# Patient Record
Sex: Female | Born: 1970 | Race: White | Hispanic: No | Marital: Married | Smoking: Never smoker
Health system: Southern US, Community
[De-identification: ages and names within clinical notes are randomized; demographics above are authoritative.]

## PROBLEM LIST (undated history)

## (undated) DIAGNOSIS — M1990 Unspecified osteoarthritis, unspecified site: Secondary | ICD-10-CM

## (undated) DIAGNOSIS — F32A Depression, unspecified: Secondary | ICD-10-CM

## (undated) DIAGNOSIS — F329 Major depressive disorder, single episode, unspecified: Secondary | ICD-10-CM

## (undated) DIAGNOSIS — N809 Endometriosis, unspecified: Secondary | ICD-10-CM

## (undated) DIAGNOSIS — K219 Gastro-esophageal reflux disease without esophagitis: Secondary | ICD-10-CM

## (undated) DIAGNOSIS — F419 Anxiety disorder, unspecified: Secondary | ICD-10-CM

## (undated) HISTORY — DX: Major depressive disorder, single episode, unspecified: F32.9

## (undated) HISTORY — DX: Depression, unspecified: F32.A

## (undated) HISTORY — PX: ORTHOPEDIC SURGERY: SHX850

## (undated) HISTORY — DX: Unspecified osteoarthritis, unspecified site: M19.90

## (undated) HISTORY — DX: Anxiety disorder, unspecified: F41.9

## (undated) HISTORY — DX: Gastro-esophageal reflux disease without esophagitis: K21.9

## (undated) HISTORY — DX: Endometriosis, unspecified: N80.9

## (undated) HISTORY — PX: PARTIAL HYSTERECTOMY: SHX80

---

## 2006-07-20 LAB — CONVERTED CEMR LAB: Pap Smear: NORMAL

## 2007-06-03 ENCOUNTER — Ambulatory Visit: Payer: Self-pay | Admitting: Internal Medicine

## 2007-06-03 DIAGNOSIS — J45909 Unspecified asthma, uncomplicated: Secondary | ICD-10-CM | POA: Insufficient documentation

## 2007-06-03 DIAGNOSIS — A6000 Herpesviral infection of urogenital system, unspecified: Secondary | ICD-10-CM | POA: Insufficient documentation

## 2007-06-03 DIAGNOSIS — F411 Generalized anxiety disorder: Secondary | ICD-10-CM | POA: Insufficient documentation

## 2007-07-29 ENCOUNTER — Ambulatory Visit: Payer: Self-pay | Admitting: Internal Medicine

## 2007-08-12 ENCOUNTER — Telehealth: Payer: Self-pay | Admitting: Internal Medicine

## 2007-09-01 ENCOUNTER — Telehealth (INDEPENDENT_AMBULATORY_CARE_PROVIDER_SITE_OTHER): Payer: Self-pay | Admitting: *Deleted

## 2007-09-01 ENCOUNTER — Telehealth: Payer: Self-pay | Admitting: Internal Medicine

## 2007-10-23 ENCOUNTER — Telehealth (INDEPENDENT_AMBULATORY_CARE_PROVIDER_SITE_OTHER): Payer: Self-pay | Admitting: *Deleted

## 2007-10-24 ENCOUNTER — Ambulatory Visit: Payer: Self-pay | Admitting: Internal Medicine

## 2007-10-24 DIAGNOSIS — F411 Generalized anxiety disorder: Secondary | ICD-10-CM | POA: Insufficient documentation

## 2007-11-20 HISTORY — PX: OOPHORECTOMY: SHX86

## 2008-01-21 ENCOUNTER — Telehealth: Payer: Self-pay | Admitting: Internal Medicine

## 2008-04-19 ENCOUNTER — Telehealth (INDEPENDENT_AMBULATORY_CARE_PROVIDER_SITE_OTHER): Payer: Self-pay | Admitting: *Deleted

## 2008-04-23 ENCOUNTER — Telehealth (INDEPENDENT_AMBULATORY_CARE_PROVIDER_SITE_OTHER): Payer: Self-pay | Admitting: *Deleted

## 2008-05-26 ENCOUNTER — Telehealth (INDEPENDENT_AMBULATORY_CARE_PROVIDER_SITE_OTHER): Payer: Self-pay | Admitting: *Deleted

## 2008-07-09 ENCOUNTER — Ambulatory Visit: Payer: Self-pay | Admitting: Internal Medicine

## 2008-07-27 ENCOUNTER — Telehealth (INDEPENDENT_AMBULATORY_CARE_PROVIDER_SITE_OTHER): Payer: Self-pay | Admitting: *Deleted

## 2008-09-07 ENCOUNTER — Telehealth (INDEPENDENT_AMBULATORY_CARE_PROVIDER_SITE_OTHER): Payer: Self-pay | Admitting: *Deleted

## 2008-10-12 ENCOUNTER — Telehealth (INDEPENDENT_AMBULATORY_CARE_PROVIDER_SITE_OTHER): Payer: Self-pay | Admitting: *Deleted

## 2008-10-12 ENCOUNTER — Ambulatory Visit: Payer: Self-pay | Admitting: Family Medicine

## 2008-10-12 ENCOUNTER — Telehealth: Payer: Self-pay | Admitting: Family Medicine

## 2008-10-25 ENCOUNTER — Telehealth (INDEPENDENT_AMBULATORY_CARE_PROVIDER_SITE_OTHER): Payer: Self-pay | Admitting: *Deleted

## 2009-01-17 ENCOUNTER — Telehealth (INDEPENDENT_AMBULATORY_CARE_PROVIDER_SITE_OTHER): Payer: Self-pay | Admitting: *Deleted

## 2009-03-07 ENCOUNTER — Telehealth (INDEPENDENT_AMBULATORY_CARE_PROVIDER_SITE_OTHER): Payer: Self-pay | Admitting: *Deleted

## 2009-04-30 ENCOUNTER — Emergency Department (HOSPITAL_BASED_OUTPATIENT_CLINIC_OR_DEPARTMENT_OTHER): Admission: EM | Admit: 2009-04-30 | Discharge: 2009-04-30 | Payer: Self-pay | Admitting: Emergency Medicine

## 2009-05-19 HISTORY — PX: PELVIC FRACTURE SURGERY: SHX119

## 2009-06-03 ENCOUNTER — Telehealth (INDEPENDENT_AMBULATORY_CARE_PROVIDER_SITE_OTHER): Payer: Self-pay | Admitting: *Deleted

## 2009-06-10 ENCOUNTER — Ambulatory Visit (HOSPITAL_COMMUNITY): Admission: RE | Admit: 2009-06-10 | Discharge: 2009-06-10 | Payer: Self-pay | Admitting: Obstetrics & Gynecology

## 2009-06-10 ENCOUNTER — Encounter (INDEPENDENT_AMBULATORY_CARE_PROVIDER_SITE_OTHER): Payer: Self-pay | Admitting: Obstetrics & Gynecology

## 2009-06-17 ENCOUNTER — Ambulatory Visit: Payer: Self-pay | Admitting: Internal Medicine

## 2009-09-14 ENCOUNTER — Ambulatory Visit: Payer: Self-pay | Admitting: Internal Medicine

## 2009-09-14 DIAGNOSIS — N946 Dysmenorrhea, unspecified: Secondary | ICD-10-CM | POA: Insufficient documentation

## 2009-09-14 DIAGNOSIS — G4700 Insomnia, unspecified: Secondary | ICD-10-CM | POA: Insufficient documentation

## 2009-10-04 ENCOUNTER — Telehealth (INDEPENDENT_AMBULATORY_CARE_PROVIDER_SITE_OTHER): Payer: Self-pay | Admitting: *Deleted

## 2009-10-25 ENCOUNTER — Telehealth (INDEPENDENT_AMBULATORY_CARE_PROVIDER_SITE_OTHER): Payer: Self-pay | Admitting: *Deleted

## 2009-10-31 ENCOUNTER — Telehealth: Payer: Self-pay | Admitting: Internal Medicine

## 2009-11-14 ENCOUNTER — Emergency Department (HOSPITAL_BASED_OUTPATIENT_CLINIC_OR_DEPARTMENT_OTHER): Admission: EM | Admit: 2009-11-14 | Discharge: 2009-11-14 | Payer: Self-pay | Admitting: Emergency Medicine

## 2009-11-15 ENCOUNTER — Telehealth (INDEPENDENT_AMBULATORY_CARE_PROVIDER_SITE_OTHER): Payer: Self-pay | Admitting: *Deleted

## 2009-12-01 ENCOUNTER — Ambulatory Visit: Payer: Self-pay | Admitting: Internal Medicine

## 2009-12-13 ENCOUNTER — Encounter: Payer: Self-pay | Admitting: Internal Medicine

## 2009-12-15 ENCOUNTER — Telehealth: Payer: Self-pay | Admitting: Internal Medicine

## 2009-12-27 ENCOUNTER — Telehealth: Payer: Self-pay | Admitting: Internal Medicine

## 2010-02-24 ENCOUNTER — Telehealth: Payer: Self-pay | Admitting: Internal Medicine

## 2010-03-22 ENCOUNTER — Telehealth: Payer: Self-pay | Admitting: Internal Medicine

## 2010-04-19 ENCOUNTER — Telehealth (INDEPENDENT_AMBULATORY_CARE_PROVIDER_SITE_OTHER): Payer: Self-pay | Admitting: *Deleted

## 2010-05-23 ENCOUNTER — Telehealth: Payer: Self-pay | Admitting: Internal Medicine

## 2010-06-30 ENCOUNTER — Telehealth: Payer: Self-pay | Admitting: Internal Medicine

## 2010-07-04 ENCOUNTER — Telehealth (INDEPENDENT_AMBULATORY_CARE_PROVIDER_SITE_OTHER): Payer: Self-pay | Admitting: *Deleted

## 2010-07-21 ENCOUNTER — Ambulatory Visit: Payer: Self-pay | Admitting: Internal Medicine

## 2010-08-22 ENCOUNTER — Encounter: Payer: Self-pay | Admitting: Internal Medicine

## 2010-09-22 ENCOUNTER — Telehealth: Payer: Self-pay | Admitting: Internal Medicine

## 2010-09-25 ENCOUNTER — Encounter: Payer: Self-pay | Admitting: Internal Medicine

## 2010-10-30 ENCOUNTER — Telehealth: Payer: Self-pay | Admitting: Internal Medicine

## 2010-11-03 ENCOUNTER — Encounter
Admission: RE | Admit: 2010-11-03 | Discharge: 2010-11-03 | Payer: Self-pay | Source: Home / Self Care | Attending: Obstetrics & Gynecology | Admitting: Obstetrics & Gynecology

## 2010-11-29 ENCOUNTER — Telehealth: Payer: Self-pay | Admitting: Internal Medicine

## 2010-12-20 NOTE — Progress Notes (Signed)
Summary: alprazolam refill  Phone Note Refill Request Message from:  Fax from Pharmacy on [facility] [address] fax [phone]  Refills Requested: Medication #1:  ALPRAZOLAM 0.5 MG  TABS one p.o. t.i.d. p.r.n. panic attacks and two p.o. nightly p.r.n. insomnia Initial call taken by: Barb Merino,  December 27, 2009 11:49 AM  Follow-up for Phone Call        last office visit 12/01/09 and last filled 10/04/09 .Marland KitchenMarland KitchenMarland KitchenDoristine Devoid  December 27, 2009 3:00 PM   ok 100, 1 RF Lesley Atkin E. Kishan Wachsmuth MD  December 28, 2009 8:12 AM     Prescriptions: ALPRAZOLAM 0.5 MG  TABS (ALPRAZOLAM) one p.o. t.i.d. p.r.n. panic attacks and two p.o. nightly p.r.n. insomnia  #100 x 1   Entered by:   Doristine Devoid   Authorized by:   Nolon Rod. Flora Parks MD   Signed by:   Doristine Devoid on 12/28/2009   Method used:   Telephoned to ...       [facility] (retail)       [address]       [place], [place]  [address]       Ph: [phone]       Fax: [phone]   RxID:   [id number]

## 2010-12-20 NOTE — Progress Notes (Signed)
Summary: advair rx  Phone Note Refill Request   Refills Requested: Medication #1:  Advair Rx [facility] [address] [phone] fax--[phone]  Initial call taken by: Freddy Jaksch,  January 21, 2008 3:55 PM  Follow-up for Phone Call        new patient 7/08 --- patient only in EMR; received request from pharmacy for Advair - needs new prescription because patient has never had prescription filled there before.  Med is not on med list.   left message on machine for patient to return call ..................................................................Marland KitchenShary Decamp  January 21, 2008 4:32 PM spoke with patient - she was on Advair and Dr. Drue Novel changed to symbicort 12/08.. symbicort is $200.  Pt would like to go back to Advair -- need new prescription.  Pt does not remember what strength. ..................................................................Marland KitchenShary Decamp  January 21, 2008 4:44 PM  advair 100/50 1 puff two times a day, 1 month x 6 RF....................................................................Beyla Loney E. Treysen Sudbeck MD  January 21, 2008 9:25 PM     New/Updated Medications: ADVAIR DISKUS 100-50 MCG/DOSE  MISC (FLUTICASONE-SALMETEROL) 1 puff bid   Prescriptions: ADVAIR DISKUS 100-50 MCG/DOSE  MISC (FLUTICASONE-SALMETEROL) 1 puff bid  #1 x 6   Entered by:   Shary Decamp   Authorized by:   Nolon Rod. Sharvil Hoey MD   Signed by:   Shary Decamp on 01/22/2008   Method used:   Electronically sent to ...       [facility]       [address]       [place], [place]  [address]       Ph: [phone]       Fax: [phone]   RxID:   [id number]

## 2010-12-20 NOTE — Progress Notes (Signed)
Summary: ZO [id number] for pt to sched appt, PT RESPONDED TO CALL--SEE NOTE  Phone Note Outgoing Call   Details for Reason: I called in her inhaler to [facility] (3 month supply) but she is due an office visit.  She was due for office visit 1/09...Marland KitchenMarland KitchenShary Decamp  April 19, 2008 1:25 PM   Follow-up for Phone Call        lm am to call & schedule appt Follow-up by: Okey Regal Spring,  April 21, 2008 4:21 PM  Additional Follow-up for Phone Call Additional follow up Details #1::        PATIENT CALLED IN TODAY BECAUSE THIS IS THE FIRST CHANCE FOR HER TO GET SERVICE ON CELL--SHE IS ON VACATION AND WILL BE [place] THIS WEEKEND BECAUSE SHE WILL HAVING SURGERY ("GYN STUFF") THIS TUESDAY AND DOESNT KNOW HOW LONG RECOUP TIME WILL BE--I TOLD HER ABOUT STACIA'S MESSAGE AND WHY CAROL WAS TRYING TO REACH HER--SHE SAYS AS SOON AS SHE FEELS WELL ENOUGH TO LEAVE THE HOUSE, SHE WILL CALL us TO SCHEDULE A REFILLS APPT Additional Follow-up by: Jerolyn Shin,  April 22, 2008 12:18 PM    Additional Follow-up for Phone Call Additional follow up Details #2::    Lyla Son from

## 2010-12-20 NOTE — Assessment & Plan Note (Signed)
Summary: MEDCATION REFILL//CYD   Vital Signs:  Patient Profile:   40 Years Old Female Height:     64.5 inches Weight:      157.4 pounds Pulse rate:   76 / minute BP sitting:   100 / 60  Vitals Entered By: Shary Decamp (July 09, 2008 4:20 PM)                 Chief Complaint:  rov -- rf all meds; ? increasing sertraline.  History of Present Illness: rov -- rf all meds;  ? increasing sertraline several things going on: change her job, lost mom 2 weeks ago, getting married    Updated Prior Medication List: VICOPROFEN 7.5-200 MG  TABS (HYDROCODONE-IBUPROFEN) 1 by mouth q 4 hours as needed cramps ACYCLOVIR 200 MG  CAPS (ACYCLOVIR) 1 by mouth q 4 hours x 10 days as needed herpes ALPRAZOLAM 0.5 MG  TABS (ALPRAZOLAM) one p.o. t.i.d. p.r.n. panic attacks and two p.o. nightly p.r.n. insomnia PROVENTIL HFA 108 (90 BASE) MCG/ACT  AERS (ALBUTEROL SULFATE) 2 puff  q.i.d. p.r.n. asthma LOESTRIN 1.5/30 (21) 1.5-30 MG-MCG  TABS (NORETHINDRONE ACET-ETHINYL EST)  SERTRALINE HCL 50 MG  TABS (SERTRALINE HCL) 1 by mouth once daily ADVAIR DISKUS 100-50 MCG/DOSE  MISC (FLUTICASONE-SALMETEROL) 1 puff bid  Current Allergies (reviewed today): No known allergies   Past Medical History:    Reviewed history from 10/24/2007 and no changes required:       Asthma       G0       Anxiety-depression       Urinary incontinence         Past Surgical History:    shattered both elbows/wrist @ 40 years old - +surgery to repair    oophorectomy L (2009)   Social History:    Reviewed history from 10/24/2007 and no changes required:       Single - lives with boyfriend (will get married)       status post a divorce       no children   Risk Factors: Tobacco use:  never Passive smoke exposure:  yes Drug use:  no Alcohol use:  yes    Drinks per day:  1  PAP Smear History:    Date of Last PAP Smear:  07/20/2006   Review of Systems       no depression (sad b/c lost mom); does feel a little  blunt in her reactions  sleeps ok w/  xanax, occ difficulties particularly since mom died still exercises and sees counselor  Psych      Denies suicidal thoughts/plans.   Physical Exam  General:     alert and well-developed.   Lungs:     normal respiratory effort, no intercostal retractions, no accessory muscle use, and normal breath sounds.   Heart:     normal rate, regular rhythm, and no murmur.   Psych:     Oriented X3, memory intact for recent and remote, normally interactive, good eye contact, not anxious appearing, and not depressed appearing.    Impression & Recommendations:  Problem # 1:  ANXIETY (ICD-300.00) see above increase sertra. to 1.5 tabs , then 2 by mouth once daily if needed Her updated medication list for this problem includes:    Alprazolam 0.5 Mg Tabs (Alprazolam) ..... One p.o. t.i.d. p.r.n. panic attacks and two p.o. nightly p.r.n. insomnia    Sertraline Hcl 50 Mg Tabs (Sertraline hcl) .Marland Kitchen... 1.5  by mouth once daily   Problem #  2:  DEPRESSION (ICD-311) see #1 Her updated medication list for this problem includes:    Alprazolam 0.5 Mg Tabs (Alprazolam) ..... One p.o. t.i.d. p.r.n. panic attacks and two p.o. nightly p.r.n. insomnia    Sertraline Hcl 50 Mg Tabs (Sertraline hcl) .Marland Kitchen... 1.5  by mouth once daily   Complete Medication List: 1)  Vicoprofen 7.5-200 Mg Tabs (Hydrocodone-ibuprofen) .Marland Kitchen.. 1 by mouth q 4 hours as needed cramps 2)  Acyclovir 200 Mg Caps (Acyclovir) .Marland Kitchen.. 1 by mouth q 4 hours x 10 days as needed herpes 3)  Alprazolam 0.5 Mg Tabs (Alprazolam) .... One p.o. t.i.d. p.r.n. panic attacks and two p.o. nightly p.r.n. insomnia 4)  Proventil Hfa 108 (90 Base) Mcg/act Aers (Albuterol sulfate) .... 2 puff  q.i.d. p.r.n. asthma 5)  Loestrin 1.5/30 (21) 1.5-30 Mg-mcg Tabs (Norethindrone acet-ethinyl est) 6)  Sertraline Hcl 50 Mg Tabs (Sertraline hcl) .... 1.5  by mouth once daily 7)  Advair Diskus 100-50 Mcg/dose Misc (Fluticasone-salmeterol)  .Marland Kitchen.. 1 puff bid   Patient Instructions: 1)  increase sertraline to 1.5 tabs a day 2)  in 4 weeks , if you don't feel better, increase it to 2 a day 3)  Please schedule a follow-up appointment in 3 months.   Prescriptions: ALPRAZOLAM 0.5 MG  TABS (ALPRAZOLAM) one p.o. t.i.d. p.r.n. panic attacks and two p.o. nightly p.r.n. insomnia  #100 x 0   Entered and Authorized by:   Nolon Rod. Leoni Goodness MD   Signed by:   Nolon Rod. Francys Bolin MD on 07/09/2008   Method used:   Print then Give to Patient   RxID:   [id number] SERTRALINE HCL 50 MG  TABS (SERTRALINE HCL) 1.5  by mouth once daily  #60 x 3   Entered and Authorized by:   Nolon Rod. Willamina Grieshop MD   Signed by:   Nolon Rod. Cortney Beissel MD on 07/09/2008   Method used:   Print then Give to Patient   RxID:   [id number]  ]

## 2010-12-20 NOTE — Progress Notes (Signed)
Summary: [facility]--rx ALPRAZOLAM 0.5 MG  Phone Note Refill Request   Refills Requested: Medication #1:  ALPRAZOLAM 0.5 MG  TABS one p.o. t.i.d. p.r.n. panic attacks and two p.o. nightly p.r.n. insomnia [facility] [address]--[phone]--last filled--12.8.09  Initial call taken by: Freddy Jaksch,  January 17, 2009 2:33 PM  Follow-up for Phone Call        dr Laury Axon pls advise on med   last filled 10-26-08 #100, last OV 10-12-08..........Marland KitchenFelecia Deloach CMA  January 18, 2009 8:13 AM  Additional Follow-up for Phone Call Additional follow up Details #1::        ok to refill x1 Additional Follow-up by: Loreen Freud DO,  January 18, 2009 10:42 AM      Prescriptions: ALPRAZOLAM 0.5 MG  TABS (ALPRAZOLAM) one p.o. t.i.d. p.r.n. panic attacks and two p.o. nightly p.r.n. insomnia  #100 x 0   Entered by:   Kandice Hams   Authorized by:   Loreen Freud DO   Signed by:   Kandice Hams on 01/18/2009   Method used:   Printed then faxed to ...       [facility] (retail)       [address]       [place], [place]  [address]       Ph: [phone]       Fax: [phone]   RxID:   [id number]

## 2010-12-20 NOTE — Progress Notes (Signed)
Summary: prior auth--Ambien  Phone Note Refill Request Message from:  Fax from Pharmacy on September 22, 2010 2:24 PM  Refills Requested: Medication #1:  AMBIEN 10 MG TABS 1 by mouth at bedtime as needed insomnia prior auth -[id number] -- [facility]  [address] - fax [phone]   ---  Initial call taken by: Okey Regal Spring,  September 22, 2010 2:25 PM  Follow-up for Phone Call        forms requested. Lucious Groves CMA  September 22, 2010 4:02 PM   Forms completed and faxed, will await reply from insurance company. Lucious Groves CMA  September 25, 2010 3:29 PM      Appended Document: prior auth--Ambien approved until 11/12.

## 2010-12-20 NOTE — Progress Notes (Signed)
Summary: med refill   Phone Note Call from Patient   Caller: Patient Reason for Call: Refill Medication Summary of Call: dr. Drue Novel [phone] [facility] [phone] pt needs a refill for her acyclovir, she doesnt know the mg she doesnt have the bottle. pt has says that she has called the pharmacy and they do not have any record of this medication  Initial call taken by: Charolette Child,  September 01, 2007 8:41 AM  Follow-up for Phone Call        ?dose - pt not sure what mg or how she takes it - does not have bottle with her @ work - takes for genital herpes ..................................................................Marland KitchenShary Decamp  September 01, 2007 10:33 AM see Rx....................................................................Jacquan Savas E. Annaelle Kasel MD  September 01, 2007 1:41 PM     New/Updated Medications: ACYCLOVIR 200 MG  CAPS (ACYCLOVIR) 1 by mouth q 4 hours x 10 days as needed herpes   Prescriptions: ACYCLOVIR 200 MG  CAPS (ACYCLOVIR) 1 by mouth q 4 hours x 10 days as needed herpes  #100 x 0   Entered by:   Shary Decamp   Authorized by:   Nolon Rod. Eriberto Felch MD   Signed by:   Shary Decamp on 09/01/2007   Method used:   Electronically sent to ...       [facility]       [address]       [place], [place]  [address]       Ph: [phone]       Fax: [phone]   RxID:   [id number]

## 2010-12-20 NOTE — Progress Notes (Signed)
Summary: Medicine  Phone Note Call from Patient   Caller: Patient Summary of Call: Patient called and said she has an appt on 9.2.11 and she really needs her Alprazolam refilled. She is going out of town this hursday and is having a lot of family issues.  Initial call taken by: Harold Barban,  July 04, 2010 2:59 PM  Follow-up for Phone Call        ok call  alprazolam #21, no refills Follow-up by: [facility] E. Paz MD,  July 04, 2010 5:03 PM  Additional Follow-up for Phone Call Additional follow up Details #1::        Left detailed message. Army Fossa CMA  July 05, 2010 9:28 AM     Prescriptions: ALPRAZOLAM 0.5 MG  TABS (ALPRAZOLAM) one p.o. t.i.d. p.r.n. panic attacks and two p.o. nightly p.r.n. insomnia. NEEDS OFFICE VISIT.  #21 x 0   Entered by:   Army Fossa CMA   Authorized by:   Nolon Rod. Paz MD   Signed by:   Army Fossa CMA on 07/05/2010   Method used:   Printed then faxed to ...       [facility] (retail)       [address]       [place], [place]  [address]       Ph: [phone]       Fax: [phone]   RxID:   [id number]

## 2010-12-20 NOTE — Progress Notes (Signed)
Summary: refill  Phone Note Refill Request Message from:  Fax from Pharmacy on February 24, 2010 4:23 PM  Refills Requested: Medication #1:  ALPRAZOLAM 0.5 MG  TABS one p.o. t.i.d. p.r.n. panic attacks and two p.o. nightly p.r.n. insomnia [facility] [address] fax [phone]   Method Requested: Fax to Local Pharmacy Next Appointment Scheduled: no appt Initial call taken by: Barb Merino,  February 24, 2010 4:24 PM  Follow-up for Phone Call        LAST OV 12/01/09.  #100 with 1 refill on 12/28/09 Shary Decamp  February 24, 2010 4:25 PM   Additional Follow-up for Phone Call Additional follow up Details #1::        ok 100, no RF Lester Crickenberger E. Joshuwa Vecchio MD  February 24, 2010 4:48 PM     Prescriptions: ALPRAZOLAM 0.5 MG  TABS (ALPRAZOLAM) one p.o. t.i.d. p.r.n. panic attacks and two p.o. nightly p.r.n. insomnia  #100 x 0   Entered by:   Shary Decamp   Authorized by:   Nolon Rod. Nakaila Freeze MD   Signed by:   Shary Decamp on 02/24/2010   Method used:   Printed then faxed to ...       [facility] (retail)       [address]       [place], [place]  [address]       Ph: [phone]       Fax: [phone]   RxID:   [id number]

## 2010-12-20 NOTE — Progress Notes (Signed)
Summary: Refill Request  Phone Note Refill Request Call back at [phone] Message from:  Pharmacy on April 19, 2010 12:38 PM  Refills Requested: Medication #1:  AMBIEN 10 MG TABS 1 by mouth at bedtime as needed insomnia   Dosage confirmed as above?Dosage Confirmed   Brand Name Necessary? No   Supply Requested: 1 month   Last Refilled: 01/30/2010  Medication #2:  ALPRAZOLAM 0.5 MG  TABS one p.o. t.i.d. p.r.n. panic attacks and two p.o. nightly p.r.n. insomnia   Dosage confirmed as above?Dosage Confirmed   Supply Requested: 1 month   Last Refilled: [facility]/2010 [facility] [address]  Next Appointment Scheduled: none Initial call taken by: Harold Barban,  April 19, 2010 12:38 PM  Follow-up for Phone Call        last OV 12-01-09, last filled 02-22-10 #100, 09-14-09 #30 3 refill ambien.................Marland KitchenFelecia Deloach CMA  April 19, 2010 2:06 PM   okay to call  Ambien #30, 3 RF Okay to call alprazolam #30, no RF. Also if  she is  taking alprazolam very frequently, she needs a  office visit ( may need a SSRI instead of alprazolam for panic attacks) Jose E. Paz MD  April 20, 2010 10:30 AM   Additional Follow-up for Phone Call Additional follow up Details #1::        left message to call  office.(OV needed to discuss meds).................Marland KitchenFelecia Deloach CMA  April 20, 2010 1:26 PM   pt aware ov schedule.............Marland KitchenFelecia Deloach CMA  April 20, 2010 4:55 PM     Prescriptions: AMBIEN 10 MG TABS (ZOLPIDEM TARTRATE) 1 by mouth at bedtime as needed insomnia  #30 x 3   Entered by:   Jeremy Johann CMA   Authorized by:   Nolon Rod. Paz MD   Signed by:   Jeremy Johann CMA on 04/20/2010   Method used:   Printed then faxed to ...       [facility] (retail)       [address]       [place], [place]  [address]       Ph: [phone]       Fax: [phone]   RxID:   [id number] ALPRAZOLAM 0.5 MG  TABS (ALPRAZOLAM) one p.o. t.i.d. p.r.n. panic attacks  and two p.o. nightly p.r.n. insomnia  #30 x 0   Entered by:   Jeremy Johann CMA   Authorized by:   Nolon Rod. Paz MD   Signed by:   Jeremy Johann CMA on 04/20/2010   Method used:   Printed then faxed to ...       [facility] ([facility]ail)       [address]       [place], [place]  [address]       Ph: [phone]       Fax: [phone]   RxID:   [id number]

## 2010-12-20 NOTE — Letter (Signed)
Summary: CMN for Nebulizer/[facility]  CMN for Nebulizer/[facility]   Imported By: Lanelle Bal 12/19/2009 11:14:58  _____________________________________________________________________  External Attachment:    Type:   Image     Comment:   External Document

## 2010-12-20 NOTE — Progress Notes (Signed)
Summary: [facility]---RX  Phone Note Refill Request   Refills Requested: Medication #1:  ALPRAZOLAM 0.5 MG  TABS one p.o. t.i.d. p.r.n. panic attacks and two p.o. nightly p.r.n. insomnia [facility] [address]--[phone]  Initial call taken by: Freddy Jaksch,  March 07, 2009 2:58 PM      Prescriptions: ALPRAZOLAM 0.5 MG  TABS (ALPRAZOLAM) one p.o. t.i.d. p.r.n. panic attacks and two p.o. nightly p.r.n. insomnia  #100 x 0   Entered by:   Ardyth Man   Authorized by:   Loreen Freud DO   Signed by:   Ardyth Man on 03/08/2009   Method used:   Printed then faxed to ...       [facility] ([facility]ail)       [address]       [place], [place]  [address]       Ph: [phone]       Fax: [phone]   RxID:   [id number]

## 2010-12-20 NOTE — Assessment & Plan Note (Signed)
Summary: MED REFILL OV ALR  NS ALR   Vital Signs:  Patient profile:   40 year old female Height:      64.5 inches Weight:      155 pounds Pulse rate:   86 / minute Pulse rhythm:   regular BP sitting:   126 / 80  (left arm) Cuff size:   large  Vitals Entered By: Shary Decamp (June 17, 2009 4:22 PM) CC: rf meds   History of Present Illness: here for RF doing well   Current Medications (verified): 1)  Vicoprofen 7.5-200 Mg  Tabs (Hydrocodone-Ibuprofen) .Marland Kitchen.. 1 By Mouth Q 4 Hours As Needed Cramps 2)  Acyclovir 200 Mg  Caps (Acyclovir) .Marland Kitchen.. 1 By Mouth Q 4 Hours X 10 Days As Needed Herpes 3)  Alprazolam 0.5 Mg  Tabs (Alprazolam) .... One P.o. T.i.d. P.r.n. Panic Attacks and Two P.o. Nightly P.r.n. Insomnia**office Visit Due Now** 4)  Proventil Hfa 108 (90 Base) Mcg/act  Aers (Albuterol Sulfate) .... 2 Puff  Q.i.d. P.r.n. Asthma 5)  Advair Diskus 100-50 Mcg/dose  Misc (Fluticasone-Salmeterol) .Marland Kitchen.. 1 Puff Bid  Allergies (verified): No Known Drug Allergies  Past History:  Past Medical History: Reviewed history from 07/09/2008 and no changes required. Asthma G0 Anxiety-depression Urinary incontinence  Past Surgical History: shattered both elbows/wrist @ 40 years old - +surgery to repair oophorectomy L (2009) pelvic surgery 05-2009  Social History: Reviewed history from 07/09/2008 and no changes required. Single - lives with boyfriend (will get married) status post a divorce no children  Review of Systems       anxiety well control asthma well control uses advair only in the winter, currently not using it rarely uses proventil   Physical Exam  General:  alert and well-developed.   Lungs:  normal respiratory effort, no intercostal retractions, no accessory muscle use, and normal breath sounds.   Heart:  normal rate, regular rhythm, and no murmur.     Impression & Recommendations:  Problem # 1:  ANXIETY (ICD-300.00) stable RF The following medications were  removed from the medication list:    Sertraline Hcl 50 Mg Tabs (Sertraline hcl) .Marland Kitchen... 1.5  by mouth once daily Her updated medication list for this problem includes:    Alprazolam 0.5 Mg Tabs (Alprazolam) ..... One p.o. t.i.d. p.r.n. panic attacks and two p.o. nightly p.r.n. insomnia  Problem # 2:  ASTHMA (ICD-493.90) stable  switch to flovent , if doesn't hold her symptoms will go back to advair  Her updated medication list for this problem includes:    Proventil Hfa 108 (90 Base) Mcg/act Aers (Albuterol sulfate) .Marland Kitchen... 2 puff  q.i.d. p.r.n. asthma    Flovent Diskus 50 Mcg/blist Aepb (Fluticasone propionate (inhal)) .Marland Kitchen... 1 puff two times a day  Complete Medication List: 1)  Vicoprofen 7.5-200 Mg Tabs (Hydrocodone-ibuprofen) .Marland Kitchen.. 1 by mouth q 4 hours as needed cramps 2)  Acyclovir 200 Mg Caps (Acyclovir) .Marland Kitchen.. 1 by mouth q 4 hours x 10 days as needed herpes 3)  Alprazolam 0.5 Mg Tabs (Alprazolam) .... One p.o. t.i.d. p.r.n. panic attacks and two p.o. nightly p.r.n. insomnia 4)  Proventil Hfa 108 (90 Base) Mcg/act Aers (Albuterol sulfate) .... 2 puff  q.i.d. p.r.n. asthma 5)  Flovent Diskus 50 Mcg/blist Aepb (Fluticasone propionate (inhal)) .Marland Kitchen.. 1 puff two times a day  Patient Instructions: 1)  Please schedule a follow-up appointment in 6 to 8 months .  Prescriptions: FLOVENT DISKUS 50 MCG/BLIST AEPB (FLUTICASONE PROPIONATE (INHAL)) 1 puff two times a day  #  1 x 6   Entered and Authorized by:   Elita Quick E. Cheris Tweten MD   Signed by:   Nolon Rod. Miki Labuda MD on 06/17/2009   Method used:   Print then Give to Patient   RxID:   [id number] ADVAIR DISKUS 100-50 MCG/DOSE  MISC (FLUTICASONE-SALMETEROL) 1 puff bid  #1 x 6   Entered by:   Shary Decamp   Authorized by:   Nolon Rod. Jacinta Penalver MD   Signed by:   Shary Decamp on 06/17/2009   Method used:   Print then Give to Patient   RxID:   [id number] PROVENTIL HFA 108 (90 BASE) MCG/ACT  AERS (ALBUTEROL SULFATE) 2 puff  q.i.d. p.r.n. asthma  #1 x 3   Entered by:    Shary Decamp   Authorized by:   Nolon Rod. Kataya Guimont MD   Signed by:   Shary Decamp on 06/17/2009   Method used:   Print then Give to Patient   RxID:   [id number] ALPRAZOLAM 0.5 MG  TABS (ALPRAZOLAM) one p.o. t.i.d. p.r.n. panic attacks and two p.o. nightly p.r.n. insomnia**OFFICE VISIT DUE NOW**  #100 x 1   Entered by:   Shary Decamp   Authorized by:   Nolon Rod. Aveline Daus MD   Signed by:   Shary Decamp on 06/17/2009   Method used:   Print then Give to Patient   RxID:   [id number] ACYCLOVIR 200 MG  CAPS (ACYCLOVIR) 1 by mouth q 4 hours x 10 days as needed herpes  #100 Capsule x 1   Entered by:   Shary Decamp   Authorized by:   Nolon Rod. Garen Woolbright MD   Signed by:   Shary Decamp on 06/17/2009   Method used:   Print then Give to Patient   RxID:   [id number]

## 2010-12-20 NOTE — Medication Information (Signed)
Summary: Letter Regarding Alprazolam During Pregnancy/[facility]  Letter Regarding Alprazolam During Pregnancy/[facility]   Imported By: Lanelle Bal 09/27/2010 09:33:53  _____________________________________________________________________  External Attachment:    Type:   Image     Comment:   External Document  Appended Document: Letter Regarding Alprazolam During Pregnancy/[facility] advise patient: i received this letter in ref. to possible pregnancy, rec to discuss w/ her gyn safety of the meds she is on . Definitely can not take xanax if pregant  Appended Document: Meds/Preg  Left message to call back.Harold Barban  September 29, 2010 9:20 AM  3  Returned call to the patient and left message on voicemail to call back to office. Lucious Groves CMA  September 29, 2010 11:58 AM     Appended Document: Letter Regarding Alprazolam During Pregnancy/[facility] I spoke with pt she states that she is not pregnant.

## 2010-12-20 NOTE — Progress Notes (Signed)
  Phone Note Call from Patient Call back at Home Phone [phone]   Caller: Patient Summary of Call: pt called says she is allergic to cold weather hard time breathing" was using flovent a lot had to go to ED last night [facility]. Pt did get Advair rx from 10/31/09 ov note, but has only used once.  Informed pt use everyday it is a maintenance med, and ov is needed, Scheduled pt ov 12/01/09 to discuss asthma and options. Kandice Hams  November 15, 2009 12:07 PM  Initial call taken by: Kandice Hams,  November 15, 2009 12:08 PM

## 2010-12-20 NOTE — Medication Information (Signed)
Summary: Prior Authorization for Zolpidem Tartrate/[facility]  Prior Authorization for Zolpidem Tartrate/[facility]   Imported By: Lanelle Bal 10/09/2010 13:07:08  _____________________________________________________________________  External Attachment:    Type:   Image     Comment:   External Document

## 2010-12-20 NOTE — Assessment & Plan Note (Signed)
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 Asthma G0 Anxiety-depression Urinary incontinence  Past Surgical History: Reviewed history from 06/17/2009 and no changes required. shattered both elbows/wrist @ 40 years old - +surgery to repair oophorectomy L (2009) pelvic surgery 05-2009  Social History: Reviewed history from 07/09/2008 and no changes required. Single - lives with boyfriend (will get married) status post a divorce no children  Review of Systems       currently feels well, denies any sinus trouble. no GERD symptoms  She is taking Advair twice a day and still requires albuterol two times a day most days. She denies cough or audible wheezing  rather feels short of breath and that is what prompts her to take albuterol .  The shortness of breath goes away after albuterol  Physical Exam  General:  alert and well-developed.   Head:  sinuses not tender to palpation Ears:  R ear normal and L ear normal.   Lungs:  normal respiratory effort, no intercostal retractions, no accessory muscle use, and normal breath sounds.   Heart:  normal rate, regular rhythm, no  murmur, and no gallop.     Impression & Recommendations:  Problem # 1:  ASTHMA (ICD-493.90) asthma poorly controlled,  having shortness of breath and requiring  albuterol twice a day she desires  to have her own nebulizer to prevent another ER visit and I think that  is reasonable.  She was reminded to go to the ER anyway if the symptoms are severe Plan: Add Singulair ( pregnancy category B, nevertheless to stop medicines if  she becomes pregnant and let me know) add omeprazole over-the-counter patient to call in two weeks, if she is not at goal ( using albuterol <  3 times a week)  she will be referred to pulmonary she did have a flu shot during the summer she may try to  decrease her meds as she usually is asx even w/o meds  Her updated medication list for this problem includes:    Proventil Hfa 108 (90 Base) Mcg/act Aers (Albuterol sulfate) .Marland Kitchen... 2 puff  q.i.d. p.r.n. asthma    Advair Diskus 100-50 Mcg/dose Aepb (Fluticasone-salmeterol) .Marland Kitchen..Marland Kitchen Two times a day  Complete Medication List: 1)  Vicoprofen 7.5-200 Mg Tabs (Hydrocodone-ibuprofen) .Marland Kitchen.. 1 by mouth q 4 hours as needed cramps 2)  Acyclovir 200 Mg Caps (Acyclovir) .Marland Kitchen.. 1 by mouth q 4 hours x 10 days as needed herpes 3)  Alprazolam 0.5 Mg Tabs (Alprazolam) .... One p.o. t.i.d. p.r.n.  panic attacks and two p.o. nightly p.r.n. insomnia 4)  Proventil Hfa 108 (90 Base) Mcg/act Aers (Albuterol sulfate) .... 2 puff  q.i.d. p.r.n. asthma 5)  Ambien 10 Mg Tabs (Zolpidem tartrate) .Marland Kitchen.. 1 by mouth at bedtime as needed insomnia 6)  Advair Diskus 100-50 Mcg/dose Aepb (Fluticasone-salmeterol) .... Two times a day 7)  Singulair 10 Mg Tabs (Montelukast sodium) .... One p.o. daily 8)  Prilosec 20 Mg Cpdr (Omeprazole) .... One by mouth every morning before meals.  Patient Instructions: 1)  call in 3 weeks and let us know how you are doing Prescriptions: PRILOSEC 20 MG CPDR (OMEPRAZOLE) one by mouth every morning before meals.  #30 x 3   Entered  and Authorized by:   Nolon Rod. Dior Stepter MD   Signed by:   Nolon Rod. Cotton Beckley MD on 12/01/2009   Method used:   Electronically to        [facility] (retail)       [address]       [place], [place]  [address]       Ph: [phone]       Fax: [phone]   RxID:   [id number] SINGULAIR 10 MG TABS (MONTELUKAST SODIUM) one p.o. daily  #30 x 3   Entered and Authorized by:   Nolon Rod. Janyiah Silveri MD   Signed by:   Nolon Rod. Nishtha Raider MD on 12/01/2009   Method used:   Electronically to        [facility] (retail)       [address]       [place], [place]  [address]       Ph: [phone]       Fax: [phone]   RxID:   [id number]

## 2010-12-20 NOTE — Progress Notes (Signed)
Summary: REFILL  Phone Note Refill Request Message from:  [facility] on [facility] FAX [phone]  Refills Requested: Medication #1:  ALPRAZOLAM 0.5 MG  TABS one p.o. t.i.d. p.r.n. panic attacks and two p.o. nightly p.r.n. insomnia Initial call taken by: Barb Merino,  June 03, 2009 9:06 AM  Follow-up for Phone Call        last ov 10-12-08, last filled #100 03-08-09.Felecia Deloach CMA  June 03, 2009 4:42 PM  Additional Follow-up for Phone Call Additional follow up Details #1::        refill x1----pt needs ov Additional Follow-up by: Loreen Freud DO,  June 03, 2009 4:59 PM    New/Updated Medications: ALPRAZOLAM 0.5 MG  TABS (ALPRAZOLAM) one p.o. t.i.d. p.r.n. panic attacks and two p.o. nightly p.r.n. insomnia**OFFICE VISIT DUE NOW** Prescriptions: ALPRAZOLAM 0.5 MG  TABS (ALPRAZOLAM) one p.o. t.i.d. p.r.n. panic attacks and two p.o. nightly p.r.n. insomnia**OFFICE VISIT DUE NOW**  #100 x 0   Entered by:   Jeremy Johann CMA   Authorized by:   Loreen Freud DO   Signed by:   Jeremy Johann CMA on 06/03/2009   Method used:   Printed then faxed to ...       [facility] (retail)       [address]       [place], [place]  [address]       Ph: [phone]       Fax: [phone]   RxID:   [id number]

## 2010-12-20 NOTE — Progress Notes (Signed)
Summary: ok #30, no further RF w/o OV  Phone Note Refill Request Call back at [phone] Message from:  Pharmacy on May 23, 2010 8:08 AM  Refills Requested: Medication #1:  ALPRAZOLAM 0.5 MG  TABS one p.o. t.i.d. p.r.n. panic attacks and two p.o. nightly p.r.n. insomnia   Dosage confirmed as above?Dosage Confirmed   Supply Requested: 1 month   Last Refilled: 04/19/2010  Medication #2:  PROVENTIL HFA 108 (90 BASE) MCG/ACT  AERS 2 puff  q.i.d. p.r.n. asthma   Dosage confirmed as above?Dosage Confirmed   Supply Requested: 1 month   Last Refilled: 04/14/2010 [facility] [address]  Next Appointment Scheduled: NONE Initial call taken by: Lavell Islam,  May 23, 2010 8:08 AM  Follow-up for Phone Call        last ov- 12/01/09. Army Fossa CMA  May 23, 2010 10:00 AM  ok xanax #30, n9o RF tell patient no further RF w/o OV Anmarie Fukushima E. Wai Litt MD  May 23, 2010 12:52 PM     New/Updated Medications: ALPRAZOLAM 0.5 MG  TABS (ALPRAZOLAM) one p.o. t.i.d. p.r.n. panic attacks and two p.o. nightly p.r.n. insomnia. NEEDS OFFICE VISIT. Prescriptions: ALPRAZOLAM 0.5 MG  TABS (ALPRAZOLAM) one p.o. t.i.d. p.r.n. panic attacks and two p.o. nightly p.r.n. insomnia. NEEDS OFFICE VISIT.  #30 x 0   Entered by:   Army Fossa CMA   Authorized by:   Nolon Rod. Obi Scrima MD   Signed by:   Army Fossa CMA on 05/23/2010   Method used:   Printed then faxed to ...       [facility] (retail)       [address]       [place], [place]  [address]       Ph: [phone]       Fax: [phone]   RxID:   [id number] PROVENTIL HFA 108 (90 BASE) MCG/ACT  AERS (ALBUTEROL SULFATE) 2 puff  q.i.d. p.r.n. asthma  #3 x 0   Entered by:   Army Fossa CMA   Authorized by:   Nolon Rod. Kyal Arts MD   Signed by:   Army Fossa CMA on 05/23/2010   Method used:   Printed then faxed to ...       [facility] (retail)       [address]       [place], [place]  [address]       Ph: [phone]       Fax: [phone]   RxID:   [id number]

## 2010-12-20 NOTE — Progress Notes (Signed)
  Phone Note Outgoing Call Call back at [facility] Phone [phone]   Call placed by: Ardyth Man,  October 12, 2008 6:04 PM Call placed to: Patient Summary of Call: No pneumonia from xray patient aware Ardyth Man  October 12, 2008 6:04 PM

## 2010-12-20 NOTE — Progress Notes (Signed)
Summary: PA[facility]-RX  Phone Note Refill Request   Refills Requested: Medication #1:  ALPRAZOLAM 0.5 MG  TABS one p.o. t.i.d. p.r.n. panic attacks and two p.o. nightly p.r.n. insomnia [facility][phone]--LAST FILLED-10.21.09  Initial call taken by: Freddy Jaksch,  October 25, 2008 11:30 AM  Follow-up for Phone Call        ok to refill x1 Follow-up by: Loreen Freud DO,  October 26, 2008 11:34 AM      Prescriptions: ALPRAZOLAM 0.5 MG  TABS (ALPRAZOLAM) one p.o. t.i.d. p.r.n. panic attacks and two p.o. nightly p.r.n. insomnia  #100 x 0   Entered by:   Kandice Hams   Authorized by:   Loreen Freud DO   Signed by:   Kandice Hams on 10/26/2008   Method used:   Printed then faxed to ...       [facility] (retail)       [address]       [place], [place]  [address]       Ph: [phone]       Fax: [phone]   RxID:   [id number]

## 2010-12-20 NOTE — Progress Notes (Signed)
Summary: REFILL REQUEST (lmom 8/15, 8/16)  Phone Note Refill Request Message from:  Pharmacy on June 30, 2010 12:38 PM  Refills Requested: Medication #1:  ALPRAZOLAM 0.5 MG  TABS one p.o. t.i.d. p.r.n. panic attacks and two p.o. nightly p.r.n. insomnia. NEEDS OFFICE VISIT.   Dosage confirmed as above?Dosage Confirmed   Supply Requested: 1 month   Last Refilled: 05/23/2010 [facility]   Initial call taken by: Lavell Islam,  June 30, 2010 12:40 PM  Follow-up for Phone Call        denied, see last request Follow-up by: [facility] E. Mikle Sternberg MD,  June 30, 2010 5:03 PM  Additional Follow-up for Phone Call Additional follow up Details #1::        Left message for pt to call back .Army Fossa CMA  July 03, 2010 9:27 AM  lmtcb.Marland KitchenMarland KitchenHarold Barban  July 04, 2010 10:08 AM Nolon Rod. Aubreigh Fuerte MD  July 04, 2010 2:17 PM

## 2010-12-20 NOTE — Progress Notes (Signed)
Summary: rf meds  Phone Note Refill Request Message from:  Fax from Pharmacy - [facility] on October 04, 2009 10:35 AM  Refills Requested: Medication #1:  PROVENTIL HFA 108 (90 BASE) MCG/ACT  AERS 2 puff  q.i.d. p.r.n. asthma   Last Refilled: 06/17/2009   Notes: #1 with 3 refills  Medication #2:  ALPRAZOLAM 0.5 MG  TABS one p.o. t.i.d. p.r.n. panic attacks and two p.o. nightly p.r.n. insomnia   Last Refilled: 03/08/2009   Notes: #100 with no refills  Medication #3:  advair 100/50   Notes: changed to flovent 06/17/09 lmom for pt to return call -- advair was d/c'd 05/2009   Method Requested: Electronic Next Appointment Scheduled: NO PENDING APPT Initial call taken by: Shary Decamp,  October 04, 2009 10:40 AM  Follow-up for Phone Call        [facility] for pt to return call Follow-up by: Shary Decamp,  October 04, 2009 1:53 PM  Additional Follow-up for Phone Call Additional follow up Details #1::        Patient returned call - she is using Flovent & it works well Additional Follow-up by: Shary Decamp,  October 04, 2009 3:27 PM    Prescriptions: FLOVENT DISKUS 50 MCG/BLIST AEPB (FLUTICASONE PROPIONATE (INHAL)) 1 puff two times a day  #3 x 0   Entered by:   Shary Decamp   Authorized by:   Nolon Rod. Paz MD   Signed by:   Shary Decamp on 10/04/2009   Method used:   Printed then faxed to ...       [facility]* (mail-order)             ,          Ph: [phone]       Fax: [phone]   RxID:   [id number] PROVENTIL HFA 108 (90 BASE) MCG/ACT  AERS (ALBUTEROL SULFATE) 2 puff  q.i.d. p.r.n. asthma  #3 x 0   Entered by:   Shary Decamp   Authorized by:   Nolon Rod. Paz MD   Signed by:   Shary Decamp on 10/04/2009   Method used:   Printed then faxed to ...       [facility]* (mail-order)             ,          Ph: [phone]       Fax: [phone]   RxID:   [id number] ALPRAZOLAM 0.5 MG  TABS (ALPRAZOLAM) one p.o. t.i.d. p.r.n. panic attacks and two p.o. nightly p.r.n.  insomnia  #100 x 0   Entered by:   Shary Decamp   Authorized by:   Nolon Rod. Paz MD   Signed by:   Shary Decamp on 10/04/2009   Method used:   Printed then faxed to ...       [facility]* (mail-order)             ,          Ph: [phone]       Fax: [phone]   RxID:   [id number]

## 2010-12-20 NOTE — Progress Notes (Signed)
Summary: paz--refill  Phone Note Refill Request   Refills Requested: Medication #1:  ALPRAZOLAM 0.5 MG  TABS one p.o. t.i.d. p.r.n. panic attacks and two p.o. nightly p.r.n. insomnia [facility]-[phone] fax-[phone]---last filled--8.26.09  Initial call taken by: Freddy Jaksch,  September 07, 2008 9:52 AM  Follow-up for Phone Call        last ov 07/09/08, last refill #100 no refill 07/09/08 .Kandice Hams  September 07, 2008 12:28 PM  Follow-up by: Kandice Hams,  September 07, 2008 12:28 PM  Additional Follow-up for Phone Call Additional follow up Details #1::        ok 100 and 1 rf Jose E. Paz MD  September 07, 2008 4:57 PM        Prescriptions: ALPRAZOLAM 0.5 MG  TABS (ALPRAZOLAM) one p.o. t.i.d. p.r.n. panic attacks and two p.o. nightly p.r.n. insomnia  #100 x 0   Entered by:   Kandice Hams   Authorized by:   Nolon Rod. Paz MD   Signed by:   Kandice Hams on 09/08/2008   Method used:   Printed then faxed to ...       [facility] (retail)       [address]       [place], [place]  [address]       Ph: [phone]       Fax: [phone]   RxID:   [id number]

## 2010-12-20 NOTE — Assessment & Plan Note (Signed)
Summary: discus sleep/alr   Vital Signs:  Patient profile:   40 year old female Height:      64.5 inches Weight:      162 pounds BMI:     27.48 Pulse rate:   80 / minute Pulse rhythm:   regular BP sitting:   122 / 80  (left arm) Cuff size:   large  Vitals Entered By: Shary Decamp (September 14, 2009 4:06 PM) CC: not sleeping x 1 year Comments  - pt goes to sleep ok & wakes up & has difficulty going back to sleep  - pt states that sometimes it feels like she is "having a panic attack in the middle of the night'  - xanax does not help her stay asleep  - request rf on vicoprofen ....Marland KitchenMarland KitchenShary Decamp  September 14, 2009 4:07 PM    History of Present Illness:  complained of difficulty staying asleep for several months. she often times wakes up, and cannot go back to sleep. Sometimes she wakes up, and she feels very nervous and has palpitations.   Current Medications (verified): 1)  Vicoprofen 7.5-200 Mg  Tabs (Hydrocodone-Ibuprofen) .Marland Kitchen.. 1 By Mouth Q 4 Hours As Needed Cramps 2)  Acyclovir 200 Mg  Caps (Acyclovir) .Marland Kitchen.. 1 By Mouth Q 4 Hours X 10 Days As Needed Herpes 3)  Alprazolam 0.5 Mg  Tabs (Alprazolam) .... One P.o. T.i.d. P.r.n. Panic Attacks and Two P.o. Nightly P.r.n. Insomnia 4)  Proventil Hfa 108 (90 Base) Mcg/act  Aers (Albuterol Sulfate) .... 2 Puff  Q.i.d. P.r.n. Asthma 5)  Flovent Diskus 50 Mcg/blist Aepb (Fluticasone Propionate (Inhal)) .Marland Kitchen.. 1 Puff Two Times A Day  Allergies (verified): No Known Drug Allergies  Past History:  Past Medical History: Reviewed history from 07/09/2008 and no changes required. Asthma G0 Anxiety-depression Urinary incontinence  Past Surgical History: Reviewed history from 06/17/2009 and no changes required. shattered both elbows/wrist @ 40 years old - +surgery to repair oophorectomy L (2009) pelvic surgery 05-2009  Review of Systems       as far as her anxiety, she takes Xanax rarely, symptoms are well controlled during the day. +  stress at work, but she handles it  will denies any depression. likes  hydrocodone rf, she uses it p.r.n. for abdominal cramps  Physical Exam  General:  alert and well-developed.   Psych:  Oriented X3, memory intact for recent and remote, normally interactive, good eye contact, not anxious appearing, and not depressed appearing.     Impression & Recommendations:  Problem # 1:  INSOMNIA-SLEEP DISORDER-UNSPEC (ICD-780.52) counseled, printed material provided regards healthy sleep habits trial with Ambien. Discontinue Ambien if she suspects pregnancy  do not take along with Xanax If she cannot tolerate or it doesn't work for her, we may need to prescribe Ambien, CR, versus temazepam, or Ativan Her updated medication list for this problem includes:    Ambien 10 Mg Tabs (Zolpidem tartrate) .Marland Kitchen... 1 by mouth at bedtime as needed insomnia  Problem # 2:  DYSMENORRHEA (ICD-625.3) uses hydrocodone p.r.n., refill okay  Complete Medication List: 1)  Vicoprofen 7.5-200 Mg Tabs (Hydrocodone-ibuprofen) .Marland Kitchen.. 1 by mouth q 4 hours as needed cramps 2)  Acyclovir 200 Mg Caps (Acyclovir) .Marland Kitchen.. 1 by mouth q 4 hours x 10 days as needed herpes 3)  Alprazolam 0.5 Mg Tabs (Alprazolam) .... One p.o. t.i.d. p.r.n. panic attacks and two p.o. nightly p.r.n. insomnia 4)  Proventil Hfa 108 (90 Base) Mcg/act Aers (Albuterol sulfate) .... 2 puff  q.i.d. p.r.n.  asthma 5)  Flovent Diskus 50 Mcg/blist Aepb (Fluticasone propionate (inhal)) .Marland Kitchen.. 1 puff two times a day 6)  Ambien 10 Mg Tabs (Zolpidem tartrate) .Marland Kitchen.. 1 by mouth at bedtime as needed insomnia Prescriptions: AMBIEN 10 MG TABS (ZOLPIDEM TARTRATE) 1 by mouth at bedtime as needed insomnia  #30 x 3   Entered and Authorized by:   Elita Quick E. Andrina Locken MD   Signed by:   Nolon Rod. Courtany Mcmurphy MD on 09/14/2009   Method used:   Print then Give to Patient   RxID:   [id number] VICOPROFEN 7.5-200 MG  TABS (HYDROCODONE-IBUPROFEN) 1 by mouth q 4 hours as needed cramps  #60 x 0    Entered by:   Shary Decamp   Authorized by:   Nolon Rod. Kamaiya Antilla MD   Signed by:   Shary Decamp on 09/14/2009   Method used:   Print then Give to Patient   RxID:   [id number]

## 2010-12-20 NOTE — Progress Notes (Signed)
Summary: PAZ-ACYCLOVIR  Phone Note Refill Request Message from:  Fax from Pharmacy on July 27, 2008 10:54 AM  Refills Requested: Medication #1:  ACYCLOVIR 200 MG  CAPS 1 by mouth q 4 hours x 10 days as needed herpes [phone]  Initial call taken by: Doristine Devoid,  July 27, 2008 10:54 AM  Follow-up for Phone Call        last seen 07/09/08 last refill #100 01/12/08 .Kandice Hams  July 27, 2008 12:45 PM  Follow-up by: Kandice Hams,  July 27, 2008 12:45 PM  Additional Follow-up for Phone Call Additional follow up Details #1::        ok 100, no RF Jose E. Paz MD  July 27, 2008 1:05 PM       Prescriptions: ACYCLOVIR 200 MG  CAPS (ACYCLOVIR) 1 by mouth q 4 hours x 10 days as needed herpes  #100 Capsule x 0   Entered by:   Kandice Hams   Authorized by:   Nolon Rod. Paz MD   Signed by:   Kandice Hams on 07/28/2008   Method used:   Faxed to ...       [facility] ([facility]ail)       [address]       [place], [place]  [address]       Ph: [phone]       Fax: [phone]   RxID:   [id number] ACYCLOVIR 200 MG  CAPS (ACYCLOVIR) 1 by mouth q 4 hours x 10 days as needed herpes  #100 Capsule x 0   Entered by:   Kandice Hams   Authorized by:   Nolon Rod. Paz MD   Signed by:   Kandice Hams on 07/28/2008   Method used:   Printed then faxed to ...       [facility] ([facility]ail)       [address]       [place], [place]  [address]       Ph: [phone]       Fax: [phone]   RxID:   [id number]

## 2010-12-20 NOTE — Progress Notes (Signed)
Summary: PT WANTS TO SPEAK WITH A NURSE-lmom  Phone Note Call from Patient Call back at Home Phone [phone]   Caller: Patient Reason for Call: Talk to Nurse Complaint: Earache/Ear Infection Summary of Call: DR PAZ// PT WANTS TO KNOW HOW LONG DOES SHE HAVE TO WAIT AFTER INNERCOURSE TO FIND OUT IF SHE IS PREGNANT. Initial call taken by: Job Founds,  September 01, 2007 12:50 PM  Follow-up for Phone Call        Left message on machine to return call. ..................................................................Marland KitchenShary Decamp  September 01, 2007 2:28 PM pt called and left message on my voice mail - would like for me to leave answer on her machine -- left message - advised needs to wait until she misses a period ..................................................................Marland KitchenShary Decamp  September 01, 2007 5:02 PM

## 2010-12-20 NOTE — Letter (Signed)
Summary: CMN for Nebulizer & Supplies/[facility]  CMN for Nebulizer & Supplies/[facility]   Imported By: Lanelle Bal 12/06/2009 14:58:48  _____________________________________________________________________  External Attachment:    Type:   Image     Comment:   External Document

## 2010-12-20 NOTE — Progress Notes (Signed)
Summary: another medication  Phone Note Call from Patient Call back at Home Phone [phone]   Caller: Patient Reason for Call: Acute Illness, Talk to Nurse Summary of Call: Dr. Verlan Friends [address] Patient went to the pharamcy to get the cough medication dr. Laury Axon gave her at the office visit today and this medication is not covered by her insurance. She wanted to know if another medication could be called into the pharmacy. Initial call taken by: Charolette Child,  October 12, 2008 1:20 PM  Follow-up for Phone Call        dr lowne pls advise Follow-up by: Jeremy Johann CMA,  October 12, 2008 4:53 PM  Additional Follow-up for Phone Call Additional follow up Details #1::        guiafenesin with codeine  6 oz  1 tsp by mouth at bedtime  Additional Follow-up by: Loreen Freud DO,  October 12, 2008 5:31 PM    New/Updated Medications: GUAIFENESIN-CODEINE 100-10 MG/5ML LIQD (GUAIFENESIN-CODEINE) Take 1tsp every night at bedtime as needed for cough.   Prescriptions: GUAIFENESIN-CODEINE 100-10 MG/5ML LIQD (GUAIFENESIN-CODEINE) Take 1tsp every night at bedtime as needed for cough.  #6 x 0   Entered by:   Ardyth Man   Authorized by:   Loreen Freud DO   Signed by:   Ardyth Man on 10/12/2008   Method used:   Printed then faxed to ...       [facility] ([facility]ail)       [address]       [place], [place]  [address]       Ph: [phone]       Fax: [phone]   RxID:   [id number]

## 2010-12-20 NOTE — Progress Notes (Signed)
Summary: pt returned call re change of med    Phone Note From Pharmacy   Caller: [facility] Details for Reason: proventil Summary of Call: [facility] called in ref to pt Proventil rx Her insurance co recommends ProAir HFA for savings of $4, both are  covered but insurance recommends ProAir, please call Lyla Son at [phone] YQM#[id number] Initial call taken by: Kandice Hams,  April 23, 2008 5:23 PM  Follow-up for Phone Call        pk w/me if patient agrees...Marland KitchenMarland KitchenJose E. Paz MD  April 25, 2008 9:02 AM   Additional Follow-up for Phone Call Additional follow up Details #1::        Left message on machine for patient to return call when avaliable. Reason for call was to see if patient is willing to change medication? Additional Follow-up by: Shonna Chock,  April 26, 2008 9:26 AM    Additional Follow-up for Phone Call Additional follow up Details #2::    patient returned call re change of med - patient said if it works the same she is ok with the change Follow-up by: Okey Regal [place],  April 26, 2008 2:06 PM  Additional Follow-up for Phone Call Additional follow up Details #3:: Details for Additional Follow-up Action Taken: CALLED [phone] TO INFORM OF DR.PAZ/PATIENT'S AGREEMENT TO CHANGE MED. Shonna Chock  April 26, 2008 4:09 PM   New/Updated Medications: PROAIR HFA 108 (90 BASE) MCG/ACT  AERS (ALBUTEROL SULFATE) 2 PUFFS QID as needed ASTHAM

## 2010-12-20 NOTE — Assessment & Plan Note (Signed)
Summary: acute cough,temp /cbs   Vital Signs:  Patient Profile:   40 Years Old Female Height:     64.5 inches Weight:      162.4 pounds BMI:     27.54 Temp:     98.3 degrees F oral Pulse rate:   80 / minute BP sitting:   118 / 80  (left arm)  Pt. in pain?   no  Vitals Entered By: Jeremy Johann CMA (October 12, 2008 11:25 AM)                  PCP:  paz  Chief Complaint:  cough, headaches, loss of voice, and Cough.  History of Present Illness:  Cough      This is a 40 year old woman who presents with Cough.  The symptoms began duration > 3 days ago.  Pt here c/o cough for greated than 1 week.  She saw the nurse at work and otc given with no relief.  The patient reports productive cough, wheezing, and fever.  Associated symtpoms include nasal congestion.  The patient denies the following symptoms: cold/URI symptoms, sore throat, chronic rhinitis, weight loss, acid reflux symptoms, and peripheral edema.  The cough is worse with lying down.  Ineffective prior treatments have included OTC cough medication, throat lozenges, albuterol inhaler, and other asthma medication.      Current Allergies (reviewed today): No known allergies   Past Medical History:    Reviewed history from 07/09/2008 and no changes required:       Asthma       G0       Anxiety-depression       Urinary incontinence         Past Surgical History:    Reviewed history from 07/09/2008 and no changes required:       shattered both elbows/wrist @ 40 years old - +surgery to repair       oophorectomy L (2009)   Family History:    Reviewed history from 06/03/2007 and no changes required:       Father: living: asthma       Mother: living; thyroid dz         Social History:    Reviewed history from 07/09/2008 and no changes required:       Single - lives with boyfriend (will get married)       status post a divorce       no children   Risk Factors: Tobacco use:  never Passive smoke exposure:   yes Drug use:  no Alcohol use:  yes    Drinks per day:  1  PAP Smear History:    Date of Last PAP Smear:  07/20/2006   Review of Systems      See HPI   Physical Exam  General:     Well-developed,well-nourished,in no acute distress; alert,appropriate and cooperative throughout examination Ears:     External ear exam shows no significant lesions or deformities.  Otoscopic examination reveals clear canals, tympanic membranes are intact bilaterally without bulging, retraction, inflammation or discharge. Hearing is grossly normal bilaterally. Nose:     R frontal sinus tenderness.   Mouth:     Oral mucosa and oropharynx without lesions or exudates.  Teeth in good repair. Neck:     No deformities, masses, or tenderness noted. Lungs:     decreased BS R ul ---- no crackles or wheezes Heart:     normal rate, regular rhythm, and no murmur.  Skin:     Intact without suspicious lesions or rashes Cervical Nodes:     No lymphadenopathy noted Psych:     Oriented X3, memory intact for recent and remote, and normally interactive.      Impression & Recommendations:  Problem # 1:  ACUTE BRONCHITIS (ICD-466.0)  Her updated medication list for this problem includes:    Proventil Hfa 108 (90 Base) Mcg/act Aers (Albuterol sulfate) .Marland Kitchen... 2 puff  q.i.d. p.r.n. asthma    Advair Diskus 100-50 Mcg/dose Misc (Fluticasone-salmeterol) .Marland Kitchen... 1 puff bid    Biaxin Xl 500 Mg Xr24h-tab (Clarithromycin) .Marland Kitchen... 2 by mouth once daily    Tussionex Pennkinetic Er 8-10 Mg/52ml Lqcr (Chlorpheniramine-hydrocodone) .Marland Kitchen... 1 tsp by mouth two times a day as needed  Orders: Nebulizer Tx (16109) Xopenex 1.25mg  (U0454) T-2 View CXR (71020TC)  Take antibiotics and other medications as directed. Encouraged to push clear liquids, get enough rest, and take acetaminophen as needed. To be seen in 5-7 days if no improvement, sooner if worse.   Complete Medication List: 1)  Vicoprofen 7.5-200 Mg Tabs  (Hydrocodone-ibuprofen) .Marland Kitchen.. 1 by mouth q 4 hours as needed cramps 2)  Acyclovir 200 Mg Caps (Acyclovir) .Marland Kitchen.. 1 by mouth q 4 hours x 10 days as needed herpes 3)  Alprazolam 0.5 Mg Tabs (Alprazolam) .... One p.o. t.i.d. p.r.n. panic attacks and two p.o. nightly p.r.n. insomnia 4)  Proventil Hfa 108 (90 Base) Mcg/act Aers (Albuterol sulfate) .... 2 puff  q.i.d. p.r.n. asthma 5)  Loestrin 1.5/30 (21) 1.5-30 Mg-mcg Tabs (Norethindrone acet-ethinyl est) 6)  Sertraline Hcl 50 Mg Tabs (Sertraline hcl) .... 1.5  by mouth once daily 7)  Advair Diskus 100-50 Mcg/dose Misc (Fluticasone-salmeterol) .Marland Kitchen.. 1 puff bid 8)  Biaxin Xl 500 Mg Xr24h-tab (Clarithromycin) .... 2 by mouth once daily 9)  Tussionex Pennkinetic Er 8-10 Mg/23ml Lqcr (Chlorpheniramine-hydrocodone) .Marland Kitchen.. 1 tsp by mouth two times a day as needed    Prescriptions: TUSSIONEX PENNKINETIC ER 8-10 MG/5ML LQCR (CHLORPHENIRAMINE-HYDROCODONE) 1 tsp by mouth two times a day as needed  #6 oz x 0   Entered and Authorized by:   Loreen Freud DO   Signed by:   Loreen Freud DO on 10/12/2008   Method used:   Print then Give to Patient   RxID:   [id number] BIAXIN XL 500 MG XR24H-TAB (CLARITHROMYCIN) 2 by mouth once daily  #28 x 0   Entered and Authorized by:   Loreen Freud DO   Signed by:   Loreen Freud DO on 10/12/2008   Method used:   Electronically to        [facility] [id number][facility] (retail)       [address]       [place], [place]  [address]       Ph: [phone]       Fax: [phone]   RxID:   [id number]  ]  Medication Administration  Medication # 1:    Medication: Xopenex 1.25mg     Diagnosis: ACUTE BRONCHITIS (ICD-466.0)    Route: inhaled    Exp Date: 10/19/2009    Lot #: [id number]    Mfr: sepracor    Patient tolerated medication without complications    Given by: Jeremy Johann CMA (October 12, 2008 1:24 PM)  Orders Added: 1)  Est. Patient Level IV [87564] 2)  Nebulizer Tx [94640] 3)   Xopenex 1.25mg  [P3295] 4)  T-2 View CXR [71020TC]

## 2010-12-20 NOTE — Progress Notes (Signed)
Summary: No further RF w/o OV  Phone Note Call from Patient Call back at [facility] Phone [phone]   Caller: Patient Summary of Call: PT CALLED WOULD LIKE REFILL ON ALL MEDICATION UNTIL HER APPT 06/25/08 WITH DR PAZ  [facility] Initial call taken by: Doristine Devoid,  May 26, 2008 4:50 PM  Follow-up for Phone Call        Spoke with pt who says she has appt on 06/25/08, need refill on Sertraline, Alprazolam and Proventil.  I printed and faxed rx for Proventil amd Sertraline.  OK for Alprazolam #30?Marland KitchenKandice Hams  May 27, 2008 12:18 PM  Follow-up by: Kandice Hams,  May 27, 2008 12:18 PM  Additional Follow-up for Phone Call Additional follow up Details #1::        RF done , no further RF w/o OV.Marland KitchenMarland KitchenJose E. Paz MD  May 28, 2008 10:00 AM     Additional Follow-up for Phone Call Additional follow up Details #2::    rx faxed to [facility] pt has ov 06/25/08.Kandice Hams  May 28, 2008 10:26 AM  Follow-up by: Kandice Hams,  May 28, 2008 10:27 AM    Prescriptions: ALPRAZOLAM 0.5 MG  TABS (ALPRAZOLAM) one p.o. t.i.d. p.r.n. panic attacks and two p.o. nightly p.r.n. insomnia  #100 x 0   Entered and Authorized by:   Nolon Rod. Paz MD   Signed by:   Nolon Rod. Paz MD on 05/28/2008   Method used:   Print then Give to Patient   RxID:   [id number] PROVENTIL HFA 108 (90 BASE) MCG/ACT  AERS (ALBUTEROL SULFATE) 2 puff  q.i.d. p.r.n. asthma  #1 x 0   Entered by:   Kandice Hams   Authorized by:   Nolon Rod. Paz MD   Signed by:   Kandice Hams on 05/27/2008   Method used:   Printed then faxed to ...       [facility]       [address]       [place], [place]  [address]       Ph: [phone]       Fax: [phone]   RxID:   [id number] SERTRALINE HCL 50 MG  TABS (SERTRALINE HCL) 1 by mouth once daily x 7 days, then 1.5 tab by mouth once daily  #45 Tablet x 0   Entered by:   Kandice Hams   Authorized by:   Nolon Rod. Paz MD   Signed by:   Kandice Hams on  05/27/2008   Method used:   Printed then faxed to ...       [facility]       [address]       [place], [place]  [address]       Ph: [phone]       Fax: [phone]   RxID:   [id number]

## 2010-12-20 NOTE — Progress Notes (Signed)
Summary: rf xanax  Phone Note Refill Request Message from:  Fax from Pharmacy on August 12, 2007 4:35 PM  Refills Requested: Medication #1:  ALPRAZOLAM 0.5 MG  TABS one p.o. t.i.d. p.r.n. panic attacks and two p.o. nightly p.r.n. insomnia  Method Requested: Fax to Local Pharmacy Initial call taken by: Shary Decamp,  August 12, 2007 4:36 PM  Follow-up for Phone Call        ok 100 + 2 RF....................................................................Connelly [place] E. Tonatiuh Mallon MD  August 12, 2007 4:52 PM       Prescriptions: ALPRAZOLAM 0.5 MG  TABS (ALPRAZOLAM) one p.o. t.i.d. p.r.n. panic attacks and two p.o. nightly p.r.n. insomnia  #100 x 2   Entered by:   Shary Decamp   Authorized by:   Nolon Rod. Regina Ganci MD   Signed by:   Shary Decamp on 08/12/2007   Method used:   Printed then faxed to ...         RxID:   [id number]

## 2010-12-20 NOTE — Progress Notes (Signed)
Summary: questions about exercising  Phone Note Call from Patient   Caller: Patient Summary of Call: patient called and wanted to know if she should be exercising says that ever year about this time due to the cold weather she has a hard time w/ asthma says she know that she shouldn't be but she has been using her inhaler more than four times a day as needed some days thus causing some HA's Informed that if she is having to use inhaler that much she really should take it easy and not to do any exercises until she feels like she can to minimize use of albuterol inhaler b/c overuse can be dangerous offered appt w/ Dr. so lungs could be listened to but says she is unable to get off work doesn't have enough time but will schedule if needed also recommended using a decongestant such as mucinex d to help clear up congestion patient agreed. Initial call taken by: Doristine Devoid,  October 25, 2009 3:25 PM

## 2010-12-20 NOTE — Progress Notes (Signed)
Summary: depression left msg to call  Phone Note Call from Patient Call back at Home Phone [phone]   Caller: Patient Reason for Call: Talk to Nurse Summary of Call: dr. Drue [facility]  pt would like to be put on Zoloft. she is having the same depression issues as before. Initial call taken by: Charolette Child,  October 23, 2007 9:36 AM  Follow-up for Phone Call        left msg to call (need office visit scheduled tomorrow at 9:45 am)...................................................................Marland KitchenKandice Hams  October 23, 2007 11:47 AM   Additional Follow-up for Phone Call Additional follow up Details #1::        informed pt that she has on ov for dr. Drue Novel ..................................................................Marland KitchenCharolette Child  October 23, 2007 2:39 PM

## 2010-12-20 NOTE — Progress Notes (Signed)
Summary: Paz--Refill  Phone Note Refill Request   Refills Requested: Medication #1:  Proair Hfa [phone]  Initial call taken by: Freddy Jaksch,  April 19, 2008 9:22 AM      Prescriptions: PROVENTIL HFA 108 (90 BASE) MCG/ACT  AERS (ALBUTEROL SULFATE) 2 puff  q.i.d. p.r.n. asthma  #3 x 0   Entered by:   Shary Decamp   Authorized by:   Nolon Rod. Paz MD   Signed by:   Shary Decamp on 04/19/2008   Method used:   Printed then faxed to ...       [facility]             , [place]         Ph:        Fax: [phone]   RxID:   [id number]

## 2010-12-20 NOTE — Assessment & Plan Note (Signed)
Summary: NEW PT//TL   Vital Signs:  Patient Profile:   40 Years Old Female Height:     64.5 inches Weight:      156 pounds Pulse rate:   80 / minute Pulse rhythm:   regular BP sitting:   122 / 80  (left arm) Cuff size:   large  Vitals Entered By: Shary Decamp (June 03, 2007 1:15 PM)             Is Patient Diabetic? No   Chief Complaint:  new pt - having a lot of anxiety - c/o rash on arms x 2 mos.  History of Present Illness: one-year history of anxiety. symptoms are described as a very mild cost and anxiety, occasional panic attack describes as nervousness and palpitations that may last for hours.  No associated symptoms. the panic attacks are worse at night and very seldom happen at work. Also she has a hard time sleeping sometimes.  Current Allergies: No known allergies   Past Medical History:    Asthma    G0    Anxiety    Urinary incontinence  Past Surgical History:    shattered both elbows/wrist @ 40 years old - +surgery to repair   Family History:    Father: living: asthma    Mother: living; thyroid dz      Social History:    Single - lives with boyfriend    status post a divorce   Risk Factors:  Tobacco use:  never Passive smoke exposure:  yes Drug use:  no Alcohol use:  yes    Drinks per day:  1    Comments:  wine   PAP Smear History:     Date of Last PAP Smear:  07/20/2006    Results:  normal    Review of Systems       denies chest pain, shortness of breath, lower extremity edema, syncope  very seldom feels depressed many years ago while she was going through a divorce she felt depressed and  took  Zoloft. very active, works out frequently without problems the rest admits to a stressful job, occasionally has anxiety at home but nothing out of the ordinary.   Physical Exam  General:     alert, well-developed, and well-nourished.   Neck:     no thyromegaly.   Lungs:     normal respiratory effort, no intercostal retractions, and  no accessory muscle use.   Extremities:     edema Psych:     Oriented X3, memory intact for recent and remote, normally interactive, good eye contact, and not depressed appearing.  slight anxious    Impression & Recommendations:  Problem # 1:  ANXIETY (ICD-300.00) the patient has anxiety. Recommend counseling.  She will be referred. As far as medications , options are: p.r.n. Xanax for insomnia and panic attacks SSRIs that she will have to take daily. The idea of  taking a medication daily is not appealing and she likes to try Xanax p.r.n. first. I think is reasonable. In the meantime she is to continue to have her active lifestyle, yoga classes, etc. she was prescribed lorazepam long time ago but she already ran out.  Her updated medication list for this problem includes:    Alprazolam 0.5 Mg Tabs (Alprazolam) ..... One p.o. t.i.d. p.r.n. panic attacks and two p.o. nightly p.r.n. insomnia   Problem # 2:  future review of medicines. the patient has albuterol for asthma and Vicoprofen for menstrual cramps.  it  will be okay to refill them, patient to call as needed  Medications Added to Medication List This Visit: 1)  Vicoprofen 7.5-200 Mg Tabs (Hydrocodone-ibuprofen) .... Uses as needed cramps 2)  Proventil Hfa 108 (90 Base) Mcg/act Aers (Albuterol sulfate) .Marland Kitchen.. 1-2 puffs q4-6 h as needed 3)  Acyclovir  4)  Alprazolam 0.5 Mg Tabs (Alprazolam) .... One p.o. t.i.d. p.r.n. panic attacks and two p.o. nightly p.r.n. insomnia   Patient Instructions: 1)  take Xanax as prescribed. 2)  It will cause you  to be drowsy. Be careful 3)  you can not takes Xanax if you are pregnant. 4)  There is a small risk of addiction 5)  will refer you to a counselor. 6)  Please schedule a follow-up appointment in 2 months.    Prescriptions: ALPRAZOLAM 0.5 MG  TABS (ALPRAZOLAM) one p.o. t.i.d. p.r.n. panic attacks and two p.o. nightly p.r.n. insomnia  #60 x 0   Entered and Authorized by:   Nolon Rod.  Korra Christine MD   Signed by:   Nolon Rod. Nelle Sayed MD on 06/03/2007   Method used:   Print then Give to Patient   RxID:   [id number]      Preventive Care Screening  Pap Smear:    Date:  07/20/2006    Results:  normal

## 2010-12-20 NOTE — Assessment & Plan Note (Signed)
Summary: depression/alr   Vital Signs:  Patient Profile:   40 Years Old Female Height:     64.5 inches Weight:      152.4 pounds Pulse rate:   72 / minute Pulse rhythm:   regular BP sitting:   120 / 80  (left arm) Cuff size:   large  Vitals Entered By: Shary Decamp (October 24, 2007 9:57 AM)                 Chief Complaint:  discuss starting zoloft.  History of Present Illness: anxiety was better but  now has increased also w/ depression  has several things going on  Current Allergies (reviewed today): No known allergies   Past Medical History:    Asthma    G0    Anxiety    Urinary incontinence    Depression   Social History:    Single - lives with boyfriend    status post a divorce    no children    Review of Systems       other than below, ROS  is negative  not siucidal ideas sleeping poorly (xanax does help) sees counselor routinely    Physical Exam  General:     alert, well-developed, and well-nourished.   Psych:     Oriented X3, memory intact for recent and remote, normally interactive, and good eye contact.  Slt anxious    Impression & Recommendations:  Problem # 1:  ANXIETY (ICD-300.00) d/w pt s/e and alternatives of treatment (other SSRI?) we elected zoloft RTC 4 weeks call if problems-severe s/e Her updated medication list for this problem includes:    Alprazolam 0.5 Mg Tabs (Alprazolam) ..... One p.o. t.i.d. p.r.n. panic attacks and two p.o. nightly p.r.n. insomnia    Sertraline Hcl 50 Mg Tabs (Sertraline hcl) .Marland Kitchen... 1 by mouth once daily x 7 days, then 1.5 tab by mouth once daily   Problem # 2:  F2F  15 min plus  Complete Medication List: 1)  Vicoprofen 7.5-200 Mg Tabs (Hydrocodone-ibuprofen) .Marland Kitchen.. 1 by mouth q 4 hours as needed cramps 2)  Acyclovir 200 Mg Caps (Acyclovir) .Marland Kitchen.. 1 by mouth q 4 hours x 10 days as needed herpes 3)  Alprazolam 0.5 Mg Tabs (Alprazolam) .... One p.o. t.i.d. p.r.n. panic attacks and two p.o. nightly  p.r.n. insomnia 4)  Symbicort 80-4.5 Mcg/act Aero (Budesonide-formoterol fumarate) .... 2 puffs bid 5)  Proventil Hfa 108 (90 Base) Mcg/act Aers (Albuterol sulfate) .... 2 puff  q.i.d. p.r.n. asthma 6)  Loestrin 1.5/30 (21) 1.5-30 Mg-mcg Tabs (Norethindrone acet-ethinyl est) 7)  Sertraline Hcl 50 Mg Tabs (Sertraline hcl) .Marland Kitchen.. 1 by mouth once daily x 7 days, then 1.5 tab by mouth once daily   Patient Instructions: 1)  Please schedule a follow-up appointment in 4 weeks.    Prescriptions: ALPRAZOLAM 0.5 MG  TABS (ALPRAZOLAM) one p.o. t.i.d. p.r.n. panic attacks and two p.o. nightly p.r.n. insomnia  #100 x 2   Entered and Authorized by:   Nolon Rod. Denni France MD   Signed by:   Nolon Rod. Jackelyne Sayer MD on 10/24/2007   Method used:   Print then Give to Patient   RxID:   [id number] SERTRALINE HCL 50 MG  TABS (SERTRALINE HCL) 1 by mouth once daily x 7 days, then 1.5 tab by mouth once daily  #45 x 1   Entered and Authorized by:   Elita Quick E. Khamauri Bauernfeind MD   Signed by:   Nolon Rod. Zeek Rostron MD on 10/24/2007  Method used:   Print then Give to Patient   RxID:   [id number]  ]

## 2010-12-20 NOTE — Assessment & Plan Note (Signed)
Summary: med refill/cbs/rescd cbs   Vital Signs:  Patient profile:   40 year old female Weight:      153 pounds Pulse rate:   92 / minute Pulse rhythm:   regular BP sitting:   120 / 80  (left arm) Cuff size:   large  Vitals Entered By: Army Fossa CMA (July 21, 2010 8:25 AM) CC: Pt here for F/u, refills. Comments Needs refills on everything. Pharm- [facility].   History of Present Illness: routine office visit Doing well  Allergies (verified): No Known Drug Allergies  Past History:  Past Medical History: Reviewed history from 07/09/2008 and no changes required. Asthma G0 Anxiety-depression Urinary incontinence  Past Surgical History: Reviewed history from 06/17/2009 and no changes required. shattered both elbows/wrist @ 40 years old - +surgery to repair oophorectomy L (2009) pelvic surgery 05-2009  Social History: Reviewed history from 07/09/2008 and no changes required. married (2nd marriage) no children  Review of Systems       asthma well controlled, typically during the summer that is the case. She's only taking Proventil rarely She never tried Singulair as far as insomnia, she takes 2 Xanax almost nightly and Ambien once a week on average. She does not combine both. Uses Xanax to prevent waking up with panic attacks and Ambien only if she can't sleep. Denies depression  Physical Exam  General:  alert and well-developed.   Lungs:  normal respiratory effort, no intercostal retractions, no accessory muscle use, and normal breath sounds.   Heart:  normal rate, regular rhythm, and no murmur.   Psych:  Oriented X3, memory intact for recent and remote, normally interactive, good eye contact, not anxious appearing, and not depressed appearing.     Impression & Recommendations:  Problem # 1:  INSOMNIA-SLEEP DISORDER-UNSPEC (ICD-780.52)   she takes 2 Xanax almost nightly and Ambien once a week on average. She does not combine both.  Uses Xanax  to prevent waking up with panic attacks and Ambien only if she can't sleep. I agreed to refill both, we agreed not to combine them   Her updated medication list for this problem includes:    Ambien 10 Mg Tabs (Zolpidem tartrate) .Marland Kitchen... 1 by mouth at bedtime as needed insomnia  Problem # 2:  DEPRESSION (ICD-311) asymptomatic Her updated medication list for this problem includes:    Alprazolam 0.5 Mg Tabs (Alprazolam) ..... One p.o. t.i.d. p.r.n. panic attacks and two p.o. nightly p.r.n. insomnia. needs office visit.  Problem # 3:  HERPES, GENITAL NOS (ICD-054.10) refill Valtrex  Problem # 4:  ASTHMA (ICD-493.90) well controlled, she never tried Singulair We agreed to restart Advair in 2 weeks because her symptoms usually increase when the weather changes. report she will have her flu shot in 2 weeks  The following medications were removed from the medication list:    Singulair 10 Mg Tabs (Montelukast sodium) ..... One p.o. daily Her updated medication list for this problem includes:    Proventil Hfa 108 (90 Base) Mcg/act Aers (Albuterol sulfate) .Marland Kitchen... 2 puff  q.i.d. p.r.n. asthma    Advair Diskus 100-50 Mcg/dose Aepb (Fluticasone-salmeterol) .Marland Kitchen..Marland Kitchen Two times a day  Complete Medication List: 1)  Vicoprofen 7.5-200 Mg Tabs (Hydrocodone-ibuprofen) .Marland Kitchen.. 1 by mouth q 4 hours as needed cramps 2)  Acyclovir 200 Mg Caps (Acyclovir) .Marland Kitchen.. 1 by mouth q 4 hours x 10 days as needed herpes 3)  Alprazolam 0.5 Mg Tabs (Alprazolam) .... One p.o. t.i.d. p.r.n. panic attacks and two p.o. nightly  p.r.n. insomnia. needs office visit. 4)  Proventil Hfa 108 (90 Base) Mcg/act Aers (Albuterol sulfate) .... 2 puff  q.i.d. p.r.n. asthma 5)  Ambien 10 Mg Tabs (Zolpidem tartrate) .Marland Kitchen.. 1 by mouth at bedtime as needed insomnia 6)  Advair Diskus 100-50 Mcg/dose Aepb (Fluticasone-salmeterol) .... Two times a day   Patient Instructions: 1)  Please schedule a follow-up appointment in 6 months .   Prescriptions: ALPRAZOLAM 0.5 MG  TABS (ALPRAZOLAM) one p.o. t.i.d. p.r.n. panic attacks and two p.o. nightly p.r.n. insomnia. NEEDS OFFICE VISIT.  #60 x 3   Entered and Authorized by:   Nolon Rod. Gio Janoski MD   Signed by:   Nolon Rod. Tyjuan Demetro MD on 07/21/2010   Method used:   Print then Give to Patient   RxID:   [id number] AMBIEN 10 MG TABS (ZOLPIDEM TARTRATE) 1 by mouth at bedtime as needed insomnia  #30 x 0   Entered and Authorized by:   Nolon Rod. Hortencia Martire MD   Signed by:   Nolon Rod. Annaelle Kasel MD on 07/21/2010   Method used:   Print then Give to Patient   RxID:   [id number] ADVAIR DISKUS 100-50 MCG/DOSE AEPB (FLUTICASONE-SALMETEROL) two times a day  #1 x 6   Entered and Authorized by:   Nolon Rod. Niko Penson MD   Signed by:   Nolon Rod. Alvaretta Eisenberger MD on 07/21/2010   Method used:   Print then Give to Patient   RxID:   [id number] PROVENTIL HFA 108 (90 BASE) MCG/ACT  AERS (ALBUTEROL SULFATE) 2 puff  q.i.d. p.r.n. asthma  #3 x 0   Entered and Authorized by:   Nolon Rod. Shuntell Foody MD   Signed by:   Nolon Rod. Allysha Tryon MD on 07/21/2010   Method used:   Print then Give to Patient   RxID:   [id number] ACYCLOVIR 200 MG  CAPS (ACYCLOVIR) 1 by mouth q 4 hours x 10 days as needed herpes  #100 x 1   Entered and Authorized by:   Elita Quick E. Haile Bosler MD   Signed by:   Nolon Rod. Antron Seth MD on 07/21/2010   Method used:   Print then Give to Patient   RxID:   [id number]

## 2010-12-20 NOTE — Progress Notes (Signed)
Summary: calling to check on pt  ---- Converted from flag ---- ---- 12/01/2009 5:27 PM, Dannette Kinkaid E. Kirtis Challis MD wrote: see last OV , call , doing better ------------------------------ Left message on machine for pt to return call Shary Decamp  December 15, 2009 2:13 PM Patient left message on VM that she was feeling A LOT better Shary Decamp  December 16, 2009 1:21 PM

## 2010-12-20 NOTE — Assessment & Plan Note (Signed)
Summary: 42months//tl   Vital Signs:  Patient Profile:   40 Years Old Female Height:     64.5 inches Weight:      157.25 pounds Pulse rate:   80 / minute BP sitting:   100 / 80  Vitals Entered By: Kandice Hams (July 29, 2007 9:39 AM)                 Chief Complaint:  followup.  History of Present Illness: Xanax works great, uses it p.r.n., sometimes even half tablet do the job.  Current Allergies: No known allergies      Review of Systems       sleeping better. had some counseling network Exercising more. She uses Proventil daily.  She uses because shortness or breath that resolve after Proventil. Denied cough, wheezing, GERD symptoms. During the wintertime, she uses advair Her asthma used to be better in [place], since she moved to [place] is worse.   Physical Exam  General:     alert, well-developed, and well-nourished.   Lungs:     normal respiratory effort, no intercostal retractions, and no accessory muscle use.   Heart:     normal rate, regular rhythm, and no murmur.      Impression & Recommendations:  Problem # 1:  ANXIETY (ICD-300.00) well controlled on Xanax p.r.n. No change Her updated medication list for this problem includes:    Alprazolam 0.5 Mg Tabs (Alprazolam) ..... One p.o. t.i.d. p.r.n. panic attacks and two p.o. nightly p.r.n. insomnia   Problem # 2:  ASTHMA (ICD-493.90) goal is to use less than three Proventil's weekly. I recommend to start Symbicort 80/4.5 twice a day. samples x 1 see instructions  The following medications were removed from the medication list:    Proventil Hfa 108 (90 Base) Mcg/act Aers (Albuterol sulfate) .Marland Kitchen... 1-2 puffs q4-6 h as needed  Her updated medication list for this problem includes:    Symbicort 80-4.5 Mcg/act Aero (Budesonide-formoterol fumarate) .Marland Kitchen... 2 puffs bid    Proventil Hfa 108 (90 Base) Mcg/act Aers (Albuterol sulfate) .Marland Kitchen... 2 puff  q.i.d. p.r.n. asthma   Complete  Medication List: 1)  Vicoprofen 7.5-200 Mg Tabs (Hydrocodone-ibuprofen) .Marland Kitchen.. 1 by mouth q 4 hours as needed cramps 2)  Acyclovir  3)  Alprazolam 0.5 Mg Tabs (Alprazolam) .... One p.o. t.i.d. p.r.n. panic attacks and two p.o. nightly p.r.n. insomnia 4)  Symbicort 80-4.5 Mcg/act Aero (Budesonide-formoterol fumarate) .... 2 puffs bid 5)  Proventil Hfa 108 (90 Base) Mcg/act Aers (Albuterol sulfate) .... 2 puff  q.i.d. p.r.n. asthma   Patient Instructions: 1)  call when you need a refill on Xanax. 2)  Take Symbicort  twice a day every day. 3)  The goal is to use Proventil less than three times a week.  If you are not at goal in the next couple of months, let me know. 4)  Please schedule a follow-up appointment in 4 to 6 months.    Prescriptions: VICOPROFEN 7.5-200 MG  TABS (HYDROCODONE-IBUPROFEN) 1 by mouth q 4 hours as needed cramps  #60 x 0   Entered and Authorized by:   Nolon Rod. Dari Carpenito MD   Signed by:   Nolon Rod. Megann Easterwood MD on 07/29/2007   Method used:   Print then Give to Patient   RxID:   [id number] PROVENTIL HFA 108 (90 BASE) MCG/ACT  AERS (ALBUTEROL SULFATE) 2 puff  q.i.d. p.r.n. asthma  #1 x 6   Entered and Authorized by:   Wanda Plump  MD   Signed by:   Nolon Rod Daune Divirgilio MD on 07/29/2007   Method used:   Print then Give to Patient   RxID:   [id number] SYMBICORT 80-4.5 MCG/ACT  AERO (BUDESONIDE-FORMOTEROL FUMARATE) 2 puffs bid  #1 x 12   Entered and Authorized by:   Nolon Rod. Franceen Erisman MD   Signed by:   Nolon Rod. Malana Eberwein MD on 07/29/2007   Method used:   Print then Give to Patient   RxID:   [id number]

## 2010-12-20 NOTE — Progress Notes (Signed)
Summary: xanax - fyi  Letter from [employer]: DRUG SAFETY CONSIDERATION; XANAX DURING PREGNANCY: [facility] claims suggest that patient is receving xanax and may be planning pregnancy, inferred by the use of fertility drugs.  GYN has rx'd ovidrel 02/14/10, clomiphene 01/19/10 XANAX rx'd 02/27/10, 01/24/10 Left message on machine for pt to return call Shary Decamp  Mar 22, 2010 2:01 PM left message on machine for pt to return call Shary Decamp  Mar 25, 2010 9:34 AM discussed with pt -- pt is aware that if she does become pregnant she is to stop xanax.  Patient states she only takes once in a while when she has a panic attack Shary Decamp  Mar 27, 2010 4:25 PM

## 2010-12-20 NOTE — Progress Notes (Signed)
Summary: change flovent  Phone Note Call from Patient Call back at Home Phone [phone]   Reason for Call: Talk to Nurse Details for Reason: PATIENT LEFT MESSAGE ON VM:  - using flovent  - not contolling sxs  - wants to switch back to adviar 100/50 Initial call taken by: Shary Decamp,  October 31, 2009 1:33 PM  Follow-up for Phone Call        okay to go back to advair  as before Follow-up by: [facility] E. Brandey Vandalen MD,  October 31, 2009 2:19 PM    New/Updated Medications: ADVAIR DISKUS 100-50 MCG/DOSE AEPB (FLUTICASONE-SALMETEROL) two times a day Prescriptions: ADVAIR DISKUS 100-50 MCG/DOSE AEPB (FLUTICASONE-SALMETEROL) two times a day  #1 x 6   Entered by:   Shary Decamp   Authorized by:   Nolon Rod. Ethelyne Erich MD   Signed by:   Shary Decamp on 10/31/2009   Method used:   Electronically to        [facility] (retail)       [address]       [place], [place]  [address]       Ph: [phone]       Fax: [phone]   RxID:   [id number]

## 2010-12-21 NOTE — Progress Notes (Signed)
Summary: Alprazolam refill  Phone Note Refill Request Message from:  Fax from Pharmacy on October 30, 2010 11:12 AM  Refills Requested: Medication #1:  ALPRAZOLAM 0.5 MG  TABS one p.o. t.i.d. p.r.n. panic attacks and two p.o. nightly p.r.n. insomnia. NEEDS OFFICE VISIT.   Last Refilled: 10/10/2010 [id number], [address], [address] [address]    [phone]   [phone]    qty-60  Next Appointment Scheduled: none Initial call taken by: Jerolyn Shin,  October 30, 2010 11:14 AM  Follow-up for Phone Call        ok 60 and 2 RF Yassin Scales E. Louella Medaglia MD  October 30, 2010 5:17 PM     New/Updated Medications: ALPRAZOLAM 0.5 MG  TABS (ALPRAZOLAM) one p.o. t.i.d. p.r.n. panic attacks and two p.o. nightly p.r.n. insomnia. Prescriptions: ALPRAZOLAM 0.5 MG  TABS (ALPRAZOLAM) one p.o. t.i.d. p.r.n. panic attacks and two p.o. nightly p.r.n. insomnia.  #60 x 2   Entered by:   Army Fossa CMA   Authorized by:   Nolon Rod. Annisha Baar MD   Signed by:   Army Fossa CMA on 10/31/2010   Method used:   Printed then faxed to ...       [facility] (retail)       [address]       [place], [place]  [address]       Ph: [phone]       Fax: [phone]   RxID:   [id number]

## 2010-12-21 NOTE — Progress Notes (Signed)
Summary: Refill Request  Phone Note Refill Request Call back at [phone] Message from:  Pharmacy on November 29, 2010 2:35 PM  Refills Requested: Medication #1:  AMBIEN 10 MG TABS 1 by mouth at bedtime as needed insomnia   Dosage confirmed as above?Dosage Confirmed   Supply Requested: 30   Last Refilled: 10/22/2010 [facility] [address]  Next Appointment Scheduled: none Initial call taken by: Harold Barban,  November 29, 2010 2:35 PM  Follow-up for Phone Call        30, 3 RF Follow-up by: Nolon Rod. Kittie Krizan MD,  November 29, 2010 5:53 PM    Prescriptions: AMBIEN 10 MG TABS (ZOLPIDEM TARTRATE) 1 by mouth at bedtime as needed insomnia  #30 x 3   Entered by:   Army Fossa CMA   Authorized by:   Nolon Rod. Marquerite Forsman MD   Signed by:   Army Fossa CMA on 11/30/2010   Method used:   Printed then faxed to ...       [facility] (retail)       [address]       [place], [place]  [address]       Ph: [phone]       Fax: [phone]   RxID:   [id number]

## 2011-01-09 ENCOUNTER — Telehealth: Payer: Self-pay | Admitting: Internal Medicine

## 2011-01-16 NOTE — Progress Notes (Signed)
Summary: Refill--Alprazolam  Phone Note Refill Request Message from:  Fax from Pharmacy on January 09, 2011 1:47 PM  Refills Requested: Medication #1:  ALPRAZOLAM 0.5 MG  TABS one p.o. t.i.d. p.r.n. panic attacks and two p.o. nightly p.r.n. insomnia. [facility] [address] - fax [phone]  Initial call taken by: Okey Regal [place],  January 09, 2011 1:47 PM  Follow-up for Phone Call        ok 60, 2 RF Follow-up by: Elita Quick E. Ilo Beamon MD,  January 09, 2011 4:13 PM    Prescriptions: ALPRAZOLAM 0.5 MG  TABS (ALPRAZOLAM) one p.o. t.i.d. p.r.n. panic attacks and two p.o. nightly p.r.n. insomnia.  #60 x 2   Entered by:   Lucious Groves CMA   Authorized by:   Nolon Rod. Ivi Griffith MD   Signed by:   Lucious Groves CMA on 01/09/2011   Method used:   Printed then faxed to ...       [facility] (retail)       [address]       [place], [place]  [address]       Ph: [phone]       Fax: [phone]   RxID:   [id number]

## 2011-02-11 ENCOUNTER — Other Ambulatory Visit: Payer: Self-pay | Admitting: Internal Medicine

## 2011-02-25 LAB — TYPE AND SCREEN
ABO/RH(D): A NEG
Antibody Screen: NEGATIVE

## 2011-02-25 LAB — CBC
HCT: 40.4 % (ref 36.0–46.0)
Hemoglobin: 14.1 g/dL (ref 12.0–15.0)
MCHC: 34.8 g/dL (ref 30.0–36.0)
MCV: 97.3 fL (ref 78.0–100.0)
Platelets: 386 10*3/uL (ref 150–400)
RBC: 4.15 MIL/uL (ref 3.87–5.11)
RDW: 13.9 % (ref 11.5–15.5)
WBC: 7.1 10*3/uL (ref 4.0–10.5)

## 2011-02-25 LAB — ABO/RH: ABO/RH(D): A NEG

## 2011-02-25 LAB — PREGNANCY, URINE: Preg Test, Ur: NEGATIVE

## 2011-03-29 ENCOUNTER — Telehealth: Payer: Self-pay | Admitting: Internal Medicine

## 2011-03-29 ENCOUNTER — Encounter: Payer: Self-pay | Admitting: Family Medicine

## 2011-03-29 ENCOUNTER — Ambulatory Visit (INDEPENDENT_AMBULATORY_CARE_PROVIDER_SITE_OTHER): Payer: BC Managed Care – PPO | Admitting: Family Medicine

## 2011-03-29 DIAGNOSIS — S00209A Unspecified superficial injury of unspecified eyelid and periocular area, initial encounter: Secondary | ICD-10-CM

## 2011-03-29 DIAGNOSIS — W64XXXA Exposure to other animate mechanical forces, initial encounter: Secondary | ICD-10-CM

## 2011-03-29 DIAGNOSIS — H109 Unspecified conjunctivitis: Secondary | ICD-10-CM

## 2011-03-29 DIAGNOSIS — W5503XA Scratched by cat, initial encounter: Secondary | ICD-10-CM

## 2011-03-29 MED ORDER — POLYMYXIN B-TRIMETHOPRIM 10000-0.1 UNIT/ML-% OP SOLN: OPHTHALMIC | Status: DC

## 2011-03-29 MED ORDER — AMOXICILLIN-POT CLAVULANATE 875-125 MG PO TABS: 1.0000 | ORAL_TABLET | Freq: Two times a day (BID) | ORAL | Status: AC

## 2011-03-29 MED ORDER — MOXIFLOXACIN HCL 0.5 % OP SOLN: 1.0000 [drp] | Freq: Three times a day (TID) | OPHTHALMIC | Status: DC

## 2011-03-29 NOTE — Telephone Encounter (Signed)
This patient for Dr Drue Novel would like to change to Dr Beverely Low because she wants a female doctor--patient is scheduled to see Dr Drue Novel on Monday 04/02/2011 at 1:45 for anxiety---I told patient that I always had to ask both doctors as professional courtesy if it would be OK for patient to switch---since her appt is Monday, please let me know as soon as possible so I can call and reschedule    Thanks for your help

## 2011-03-29 NOTE — Patient Instructions (Signed)
Use the eye drops 3x/day for the next 7 days Take the Augmentin until it's gone (take w/ food to avoid upset stomach) Try and rest your eye- no reading, TV watching, etc If no improvement by Monday- please call and we'll get you in to see an eye doctor Sherri Rad in there!

## 2011-03-29 NOTE — Assessment & Plan Note (Signed)
Given that the scratch is just above the eye and there is a risk of peri-orbital cellulitis will start Augmentin.  Reviewed supportive care and red flags that should prompt return.  Pt expressed understanding and is in agreement w/ plan.

## 2011-03-29 NOTE — Telephone Encounter (Signed)
Ok w/ me 

## 2011-03-29 NOTE — Progress Notes (Signed)
  Subjective:    Patient ID: Danielle Oneill, female    DOB: May 26, 1971, 40 y.o.   MRN: [id number]  HPI Eye scratch- occurred Tuesday night, cat scratched eyelid and now eye is painful, red, tearing, swollen.  No fevers.   Review of Systems For ROS see HPI     Objective:   Physical Exam  Constitutional: She appears well-developed and well-nourished.       Obviously uncomfortable, wearing sunglasses  HENT:  Head: Normocephalic.  Eyes: EOM are normal. Pupils are equal, round, and reactive to light. Right eye exhibits no exudate. No foreign body present in the right eye. Right conjunctiva is injected. Right conjunctiva has no hemorrhage. Left conjunctiva is not injected.       2 cm scratch on R superior lid w/ mild surrounding erythema, no edema or induration No visible corneal abrasions on fluorescein staining + tearing          Assessment & Plan:

## 2011-03-29 NOTE — Assessment & Plan Note (Signed)
No obvious corneal abrasions seen.  Likely irritation from the nearby scratch and bacteria introduced from either cat or pt rubbing her eye.  Start abx drops.  Reviewed supportive care and red flags that should prompt return.  Pt expressed understanding and is in agreement w/ plan.

## 2011-03-30 NOTE — Telephone Encounter (Signed)
Okay with being

## 2011-04-02 ENCOUNTER — Encounter: Payer: Self-pay | Admitting: Family Medicine

## 2011-04-02 ENCOUNTER — Ambulatory Visit: Payer: Self-pay | Admitting: Internal Medicine

## 2011-04-02 ENCOUNTER — Ambulatory Visit (INDEPENDENT_AMBULATORY_CARE_PROVIDER_SITE_OTHER): Payer: BC Managed Care – PPO | Admitting: Family Medicine

## 2011-04-02 DIAGNOSIS — F411 Generalized anxiety disorder: Secondary | ICD-10-CM

## 2011-04-02 MED ORDER — CITALOPRAM HYDROBROMIDE 20 MG PO TABS: 20.0000 mg | ORAL_TABLET | Freq: Every day | ORAL | Status: DC

## 2011-04-02 NOTE — Patient Instructions (Signed)
Follow up in 1 month to recheck anxiety Start the Citalopram daily- if it makes you tired, take it at dinner This is not a weakness or a flaw- we can treat this! Continue the Alprazolam as needed for those panic moments Call with any questions or concerns Hang in there!  We'll get through this together!!!

## 2011-04-02 NOTE — Progress Notes (Signed)
  Subjective:    Patient ID: Danielle Oneill, female    DOB: 12/03/1970, 40 y.o.   MRN: [id number]  HPI Anxiety- having panic attacks for the 'last few months'.  Has xanax available for prn use but is finding she needs it more than previously.  Getting more emotional recently.  + family hx of anxiety.   Review of Systems For ROS see HPI     Objective:   Physical Exam  Constitutional: She appears well-developed and well-nourished.  Neck: Normal range of motion. No thyromegaly present.  Cardiovascular: Normal rate, regular rhythm, normal heart sounds and intact distal pulses.   No murmur heard. Pulmonary/Chest: Effort normal and breath sounds normal. No respiratory distress. She has no wheezes.  Psychiatric:       Anxious but otherwise appropriate.  Rapid speech.  No evidence of depression          Assessment & Plan:

## 2011-04-02 NOTE — Telephone Encounter (Signed)
Patient has appt on Mon 04/02/2011 at 3:30 with Dr Beverely Low

## 2011-04-03 NOTE — Op Note (Signed)
NAMEARDENIA, STINER               ACCOUNT NO.:  [id number]   MEDICAL RECORD NO.:  [id number]          PATIENT TYPE:  AMB   LOCATION:  SDC                           FACILITY:  [facility]   PHYSICIAN:  Genia Del, M.D.DATE OF BIRTH:  Jun 28, 1971   DATE OF PROCEDURE:  DATE OF DISCHARGE:  06/10/2009                               OPERATIVE REPORT   PREOPERATIVE DIAGNOSES:  1. Pelvic pain.  2. History of endometriosis.  3. Status post left salpingo-oophorectomy.  4. Right ovarian cyst.  5. Infertility.   POSTOPERATIVE DIAGNOSES:  1. Pelvic pain.  2. History of endometriosis.  3. Status post left salpingo-oophorectomy.  4. Right ovarian cyst.  5. Infertility.  6. Severe pelvic endometriosis with adhesions.   PROCEDURES:  Laparoscopy with right ovarian cystectomy, extensive lysis  of adhesions, right hydrotubation with methylene blue.   SURGEON:  Genia Del, MD   ASSISTANT:  Merrilee Jansky, CNM   ANESTHESIOLOGIST:  Quillian Quince, MD   PROCEDURE:  Under general anesthesia with endotracheal intubation, the  patient was in lithotomy position.  She was prepped with Betadine on the  abdominal suprapubic vulvar and vaginal areas and draped as usual.  The  vaginal exam revealed an anteverted uterus and fills high up towards the  abdomen.  We noticed that the posterior cul-de-sac was full.  The right  adnexa also had a full feeling and no mobility.  We then inserted the  speculum in the vagina.  The anterior lip of the cervix was grasped with  a tenaculum.  The acorn was put in place.  The speculum was removed.  We  then inserted the bladder catheter and the Foley in place.  We went to  the abdomen.  We infiltrated the subcutaneous tissue with Marcaine one-  quarter plain 5 mL at the infraumbilical area.  We made a 1.5-cm  incision with a scalpel.  We opened the aponeurosis with Mayo scissors  under direct vision and then opened the parietal peritoneum with Metz  scissors under  direct vision.  A pursestring stitch of Vicryl 0 was done  at the aponeurosis.  The Hasson was put in place and then the camera.  A  pneumoperitoneum was created with CO2.  We visualized the abdominopelvic  cavities.  The liver and the appendix were normal.  No lesion in the  abdomen.  In the pelvis, severe adhesions were present.  No adhesion  with the anterior abdominal wall.  We therefore made a 5-mm incision  after infiltrating Marcaine one-quarter plain in the right iliac area.  We inserted a 5-mm trocar under direct vision.  We infiltrated the skin  with Marcaine one-quarter plain on the left side, made a 1-cm incision  with a scalpel and inserted 11-mm trocar in the left iliac area.  We  inserted instruments.  A probe and an atraumatic clamp and inspected the  pelvic cavity.  We noted the absence of the left ovary and tube.  The  right adnexa showed severe adhesions at the level of the ovary which was  completely stuck in the posterior cul-de-sac.  It was  enlarged with  cyst.  We noted that the right tube had normal appearance and normal  fimbriae.  Fine adhesions were present between the tube and the right  ovary which were freed.  We proceeded right then with methylene blue  hydrotubation which revealed patency of the right tube.  The methylene  blue was seen to exit tube at the distal end easily.  We also noted  adhesions between the bowels and the posterior aspect of the uterus  towards the left side.  Finally the uterus showed a subserosal myoma at  the posterior aspect.  It was about 4-5 cm but the base was very wide  anteriorly and not the subserosal fibroid was present with a smaller  base, that fibroid was about 1-2 cm in diameter.  The decision was taken  not to remove those fibroids because they are not likely to interfere  with fertility.  They were not slightly to be the cause of pain in this  patient and the posterior larger fibroid have a wide base.  We inserted  the  gyrus and an atraumatic clamp, and we released adhesions between the  bowels and the posterior uterus.  We released adhesions between the  right ovary and bowels.  The right ovary was completely freed.  As we  did so, one of the endometrioma ruptured and a chocolate material  evacuated.  We irrigated and suctioned the pelvic cavity abundantly.  We  then used two atraumatic clamps and proceeded with right ovarian  cystectomy.  We removed the cyst wall of the larger endometrioma first  and was put in a bag and sent it to Pathology.  We then proceeded with a  cystectomy of a smaller cyst which appeared functional with clear fluid  and finally we opened the last right ovarian cyst and a chocolate fluid  evacuated confirming a second endometrioma.  We excised the wall of that  last cyst and sent it to Pathology.  We then completed hemostasis on the  right ovary with the gyrus, cauterizing at the border of the opened  ovary and also helped closing it and the ovary regained a more normal  shape.  We then irrigated and suctioned the pelvic cavity abundantly.  Again, we used Interceed in the posterior cul-de-sac to the right to  prevent adhesions.  We also put Interceed on the right ovary where most  of the dissection was done.  Hemostasis was adequate at all levels.  Pictures were taken before and at the end of the procedure.  We then  removed all instruments.  We evacuated the CO2.  All trocars were  removed under direct vision.  We finished with this and the  infraumbilical trocar removed as much CO2 as possible.  We tied the  pursestring stitch at the infraumbilical incision.  We put a figure-of-  eight on the aponeurosis at the 11-mm left port and closed the skin with  a subcuticular stitch of Vicryl 4-0 at the infraumbilical and at the  left 11-mm incisions.  We then applied Dermabond on all incisions.  The  instrument was removed vaginally.  The bladder catheter was removed.  The estimated  blood loss was 100 mL.  The patient received a dose of  Ancef 1 g IV at induction.  No complications occurred and the patient  was brought to recovery room in good stable status.      Genia Del, M.D.  Electronically Signed     ML/MEDQ  D:  06/10/2009  T:  06/11/2009  Job:  [id number]

## 2011-04-06 ENCOUNTER — Other Ambulatory Visit: Payer: Self-pay | Admitting: Internal Medicine

## 2011-04-09 NOTE — Assessment & Plan Note (Signed)
Pt's anxiety appears to be worsening and what is most bothersome to pt is she's not sure why.  Will start daily controller med rather than relying on benzos.  Reviewed supportive care and red flags that should prompt return.  Pt expressed understanding and is in agreement w/ plan.

## 2011-04-24 ENCOUNTER — Telehealth: Payer: Self-pay | Admitting: *Deleted

## 2011-04-24 NOTE — Telephone Encounter (Signed)
Pt states that she is still having panic attack as well. Pt aware out of office today will have Dr Beverely Low address in am. Pt notes that she will finish current Rx but would like to know if another med can be Rx

## 2011-04-24 NOTE — Telephone Encounter (Signed)
Left message to call office.Call was to advise Pt that Dr Beverely Low has gone for today.  Pt states that celexa is not working and causing increase in appetite. Pt notes that she does not like these effect of med and would like to know if their is something else she can try.Please advise

## 2011-04-25 MED ORDER — BUSPIRONE HCL 15 MG PO TABS: ORAL_TABLET | ORAL | Status: DC

## 2011-04-25 NOTE — Telephone Encounter (Signed)
Pt can stop Celexa and start Buspar 15mg .  Should take 1/2 tab twice a day for 2 weeks and then increase to 1 tab bid.  #60, 3 refills.

## 2011-04-25 NOTE — Telephone Encounter (Signed)
Discuss with patient, Rx sent to pharmacy. 

## 2011-05-14 ENCOUNTER — Telehealth: Payer: Self-pay | Admitting: *Deleted

## 2011-05-14 NOTE — Telephone Encounter (Signed)
Pt was given this med 20 days ago.  Did she have similar sxs when she was only taking 1/2 tab?  If the dizziness is new it is not likely related to the med since she has been on it for 3 weeks now.

## 2011-05-14 NOTE — Telephone Encounter (Signed)
Left message to call office

## 2011-05-15 NOTE — Telephone Encounter (Signed)
Pt states that she did not have any symptoms when she took 1/2 tab but every since she increase to 1 full tab she has been symptomatic. Pt notes that she has since stopped med on yesterday but is willing to try 1/2 to see how that works for her. Pt advise to try 1/2 tab and if symptom return or if med does not seem to be strong enough to given [place] a call back. Pt also advise Dr Beverely Low out of office until tomorrow will forward to her for any additional suggestions on med.Please advise

## 2011-05-15 NOTE — Telephone Encounter (Signed)
Left detailed message for the pt requesting that she will call with the information below.

## 2011-05-15 NOTE — Telephone Encounter (Signed)
i agree that she should resume 1/2 tab twice daily since she was not having symptoms at that dose.

## 2011-05-16 NOTE — Telephone Encounter (Signed)
Left message to call office

## 2011-05-16 NOTE — Telephone Encounter (Signed)
Discuss with patient  

## 2011-05-17 ENCOUNTER — Other Ambulatory Visit: Payer: Self-pay | Admitting: Internal Medicine

## 2011-06-11 ENCOUNTER — Other Ambulatory Visit: Payer: Self-pay | Admitting: Family Medicine

## 2011-06-11 MED ORDER — HYDROCODONE-IBUPROFEN 7.5-200 MG PO TABS: 1.0000 | ORAL_TABLET | Freq: Four times a day (QID) | ORAL | Status: AC | PRN

## 2011-06-11 NOTE — Telephone Encounter (Signed)
I am not aware of this abbreviation, Dr Beverely Low please advise.

## 2011-06-11 NOTE — Telephone Encounter (Signed)
Please ask pt what she is taking this medication for.  We do not have record of this med- who originally prescribed this?

## 2011-06-11 NOTE — Telephone Encounter (Signed)
Pt notes that it was previously given by Dr. Drue Novel for endometriosis and back pain due to previous injury. Pt notes that she does not use it often. Please advise.

## 2011-06-11 NOTE — Telephone Encounter (Signed)
Left message on voicemail to call the office

## 2011-06-11 NOTE — Telephone Encounter (Signed)
Patient needs refil for hydroco-ibu 7.5-200 - [facility] [place]

## 2011-06-11 NOTE — Telephone Encounter (Signed)
Ok for #30.  1 tab po Q6 prn for severe pain.

## 2011-06-11 NOTE — Telephone Encounter (Signed)
Done, Left message on vm notifying pt.

## 2011-06-11 NOTE — Telephone Encounter (Signed)
FYI - no hydrocodone on med list.

## 2011-07-03 ENCOUNTER — Telehealth: Payer: Self-pay | Admitting: *Deleted

## 2011-07-03 NOTE — Telephone Encounter (Signed)
See Dr Rennis Golden note

## 2011-07-03 NOTE — Telephone Encounter (Signed)
Pt was given this medicine more than 2 months ago- when did these symptoms start?  She did not tolerate Celexa either- it sounds like she may need an OV to discuss her sxs.

## 2011-07-03 NOTE — Telephone Encounter (Signed)
Pt c/o nausea, dizziness and headache which Pt states is from buspar. Pt states that she has tried taking 1/2 tab and still symptoms persist. Pt would like to have another med Rx..Please advise

## 2011-07-04 MED ORDER — FLUOXETINE HCL 20 MG PO CAPS: 20.0000 mg | ORAL_CAPSULE | Freq: Every day | ORAL | Status: DC

## 2011-07-04 NOTE — Telephone Encounter (Signed)
Pt states that symptoms started when she first began med. See telephone call dated 05-14-11. Pt indicated that due to financial reason right now she is unable to afford a OV to discuss med. Pt is requesting a med that does not cause increase hunger, nausea, dizziness, or headache. Please advise

## 2011-07-04 NOTE — Telephone Encounter (Signed)
There is no guarantee that a medicine we start will not have one of those side effects.  She can start Prozac 20mg  daily but i cannot promise her she will be free of side effects.  Often anxiety meds cause nausea at first due to the effect on seratonin.  #30, 3 refills.  Stop the buspar.

## 2011-07-04 NOTE — Telephone Encounter (Signed)
Pt return call ok to leave detail message. Left detail message on VM with dr Beverely Low comment.. Rx sent

## 2011-07-04 NOTE — Telephone Encounter (Signed)
Left message to call office

## 2011-07-25 ENCOUNTER — Other Ambulatory Visit: Payer: Self-pay | Admitting: Family Medicine

## 2011-07-25 NOTE — Telephone Encounter (Signed)
Dr.Paz please advise on request for controlled medications

## 2011-07-25 NOTE — Telephone Encounter (Signed)
Alprazolam request [last refill 01/09/11 #60x2] Zolpidem request [last refill 11/30/10 #30x3]

## 2011-07-25 NOTE — Telephone Encounter (Signed)
Alprazolam ok for #60, 1 refill Ambien ok for #30, 1 refill

## 2011-07-26 MED ORDER — ZOLPIDEM TARTRATE 10 MG PO TABS: 10.0000 mg | ORAL_TABLET | Freq: Every evening | ORAL | Status: DC | PRN

## 2011-07-26 MED ORDER — ALPRAZOLAM 0.5 MG PO TABS: 0.5000 mg | ORAL_TABLET | ORAL | Status: DC

## 2011-07-26 NOTE — Telephone Encounter (Signed)
Done

## 2011-09-11 ENCOUNTER — Ambulatory Visit: Payer: BC Managed Care – PPO

## 2011-09-18 ENCOUNTER — Ambulatory Visit (INDEPENDENT_AMBULATORY_CARE_PROVIDER_SITE_OTHER): Payer: BC Managed Care – PPO

## 2011-09-18 DIAGNOSIS — Z23 Encounter for immunization: Secondary | ICD-10-CM

## 2011-09-27 ENCOUNTER — Other Ambulatory Visit: Payer: Self-pay | Admitting: Family Medicine

## 2011-09-27 NOTE — Telephone Encounter (Signed)
Last OV 04-02-11 last refill 07-26-11 for Ambein

## 2011-09-27 NOTE — Telephone Encounter (Signed)
Ok to refill each x1 month- pt was instructed to f/u in June for anxiety and did not.  Needs appt before additional refills

## 2011-09-27 NOTE — Telephone Encounter (Signed)
msg left to call the office     KP 

## 2011-09-28 ENCOUNTER — Telehealth: Payer: Self-pay | Admitting: *Deleted

## 2011-09-28 ENCOUNTER — Other Ambulatory Visit: Payer: Self-pay | Admitting: Obstetrics & Gynecology

## 2011-09-28 DIAGNOSIS — Z1231 Encounter for screening mammogram for malignant neoplasm of breast: Secondary | ICD-10-CM

## 2011-09-28 NOTE — Telephone Encounter (Signed)
Pt advised that she really does not think that the Fluoxetine medication is not working and was wondering if she can stop taking it, and if so can she stop cold [place] or does she need to taper off?

## 2011-09-28 NOTE — Telephone Encounter (Signed)
.  left message to have patient return my call.  

## 2011-09-28 NOTE — Telephone Encounter (Signed)
Pt can decrease Prozac to every other day x2 weeks and then stop.

## 2011-10-01 ENCOUNTER — Other Ambulatory Visit: Payer: Self-pay | Admitting: *Deleted

## 2011-10-01 ENCOUNTER — Other Ambulatory Visit: Payer: Self-pay | Admitting: Internal Medicine

## 2011-10-01 MED ORDER — ZOLPIDEM TARTRATE 10 MG PO TABS: 10.0000 mg | ORAL_TABLET | Freq: Every evening | ORAL | Status: DC | PRN

## 2011-10-01 MED ORDER — ACYCLOVIR 200 MG PO CAPS: 200.0000 mg | ORAL_CAPSULE | ORAL | Status: DC

## 2011-10-01 NOTE — Telephone Encounter (Signed)
rx sent to pharmacy by e-script for acyclovir and ambien and advised to take the Prozac every other day for two weeks and then stop pt understood directions. Pt also noted she has enough prozac to last the 2 weeks and will not need a refill did not send to pharmacy. Advised pt to contact insurance about change to adviar to have a cheaper medication and that we would send her any changes the insurance company adivse for the inhaler per high price and no generic available. Pt understood

## 2011-10-01 NOTE — Telephone Encounter (Signed)
Spoke to pt to advise her medications have been filled for ambein  [facility], zovarax sent via escript.  Advised for pt to contact insurance company to see if they can change her advair per too expensive. Pt understood and will call insurance for change.

## 2011-10-03 NOTE — Telephone Encounter (Signed)
rx sent to pharmacy by e-script  

## 2011-10-08 ENCOUNTER — Other Ambulatory Visit: Payer: Self-pay | Admitting: *Deleted

## 2011-10-08 MED ORDER — ALPRAZOLAM 0.5 MG PO TABS: 0.5000 mg | ORAL_TABLET | ORAL | Status: DC

## 2011-10-08 MED ORDER — ACYCLOVIR 200 MG PO CAPS: 200.0000 mg | ORAL_CAPSULE | ORAL | Status: DC

## 2011-10-08 MED ORDER — FLUTICASONE-SALMETEROL 100-50 MCG/DOSE IN AEPB: 1.0000 | INHALATION_SPRAY | Freq: Two times a day (BID) | RESPIRATORY_TRACT | Status: DC

## 2011-10-08 MED ORDER — ALBUTEROL SULFATE HFA 108 (90 BASE) MCG/ACT IN AERS: 2.0000 | INHALATION_SPRAY | RESPIRATORY_TRACT | Status: DC | PRN

## 2011-10-08 NOTE — Telephone Encounter (Signed)
Spoke to pt to advise [facility]. apologized for inconveince. Put sample of adviar at front desk. Sent rx for lorazepam-advair-proventil and [facility]ovirax [facility]. Pt understood. Pt has appt on 11-05-11 per has not been seen since 04-02-11

## 2011-10-09 ENCOUNTER — Other Ambulatory Visit: Payer: Self-pay | Admitting: *Deleted

## 2011-10-09 NOTE — Telephone Encounter (Signed)
Called [facility] was switched to [facility] per advised pt does not have a current [facility] account per the companies are currently merging however she does have a current [facility] account with her company. infromation correct that rep has in his system. No medication noted as sent in gave verbal order to rep Ula Lingo per he advised that [facility] will not be able to fill the e script I sent them per she has no account in the system with them as of yet per merge not complete at this moment. Rep also advised pt would not get charged twice. Gave verbal orders for Advair 90day supply with 3 refills, Proventil 90 day supply with 3 refills, Xanax 0.5mg  1 tablet TID/PRN for anxiety   and 2 tablets PO/PRN at night for insomnia-Zovirax 200MG  take 1 tablet every 4 hours while awake. 50 capsules with 1 refill per the original 25 order would not be enough supply for mail order. Rep advised the order may even go out today to current pt address in our system. Called pt left message for her to call me back to advise the [facility] merge.

## 2011-10-17 ENCOUNTER — Other Ambulatory Visit: Payer: Self-pay | Admitting: *Deleted

## 2011-10-17 NOTE — Telephone Encounter (Signed)
Pt left Vm to advise that the order for xanax has not come from [facility] as of yet and will not come for a few more days. Request to have 7-10 tablets sent to local pharmacy of [facility] [place]? Last OV 04-19-11 last refill (sent to [facility]) 10-08-11

## 2011-10-17 NOTE — Telephone Encounter (Signed)
Ok for #20, no refills 

## 2011-10-18 MED ORDER — ALPRAZOLAM 0.5 MG PO TABS: 0.5000 mg | ORAL_TABLET | ORAL | Status: DC

## 2011-10-18 NOTE — Telephone Encounter (Signed)
Sent #20 with 0 refills for xanax to [facility] [place] manually faxed

## 2011-10-18 NOTE — Telephone Encounter (Signed)
Addended by: Derry Lory A on: 10/18/2011 09:25 AM   Modules accepted: Orders

## 2011-11-02 ENCOUNTER — Encounter: Payer: Self-pay | Admitting: Family Medicine

## 2011-11-05 ENCOUNTER — Encounter: Payer: Self-pay | Admitting: Family Medicine

## 2011-11-05 ENCOUNTER — Ambulatory Visit
Admission: RE | Admit: 2011-11-05 | Discharge: 2011-11-05 | Disposition: A | Discharge status: Home / Self Care | Payer: BC Managed Care – PPO | Source: Ambulatory Visit | Attending: Obstetrics & Gynecology | Admitting: Obstetrics & Gynecology

## 2011-11-05 ENCOUNTER — Ambulatory Visit (INDEPENDENT_AMBULATORY_CARE_PROVIDER_SITE_OTHER): Payer: BC Managed Care – PPO | Admitting: Family Medicine

## 2011-11-05 DIAGNOSIS — Z0000 Encounter for general adult medical examination without abnormal findings: Secondary | ICD-10-CM | POA: Insufficient documentation

## 2011-11-05 DIAGNOSIS — Z1231 Encounter for screening mammogram for malignant neoplasm of breast: Secondary | ICD-10-CM

## 2011-11-05 DIAGNOSIS — F411 Generalized anxiety disorder: Secondary | ICD-10-CM

## 2011-11-05 MED ORDER — ZOLPIDEM TARTRATE 10 MG PO TABS: 10.0000 mg | ORAL_TABLET | Freq: Every evening | ORAL | Status: DC | PRN

## 2011-11-05 MED ORDER — ALPRAZOLAM 0.5 MG PO TABS: 0.5000 mg | ORAL_TABLET | ORAL | Status: DC

## 2011-11-05 MED ORDER — FLUTICASONE-SALMETEROL 100-50 MCG/DOSE IN AEPB: 1.0000 | INHALATION_SPRAY | Freq: Two times a day (BID) | RESPIRATORY_TRACT | Status: DC

## 2011-11-05 MED ORDER — BUPROPION HCL ER (XL) 150 MG PO TB24: 150.0000 mg | ORAL_TABLET | ORAL | Status: DC

## 2011-11-05 MED ORDER — ACYCLOVIR 200 MG PO CAPS: 200.0000 mg | ORAL_CAPSULE | ORAL | Status: DC

## 2011-11-05 MED ORDER — ALBUTEROL SULFATE HFA 108 (90 BASE) MCG/ACT IN AERS: 2.0000 | INHALATION_SPRAY | RESPIRATORY_TRACT | Status: DC | PRN

## 2011-11-05 NOTE — Progress Notes (Signed)
  Subjective:    Patient ID: Danielle Oneill, female    DOB: 1971/06/07, 40 y.o.   MRN: [id number]  HPI CPE- had mammo this AM.  GYN- Dr Seymour Bars, UTD on pap.  Had blood work done at [facility] in October, 'everything was normal'.  Anxiety- reports she is still having frequent 'panic attacks'.  Reports Celexa made her 'ravenously hungry'.  Buspar made her dizzy and nauseated.  prozac 'didn't do anything for me'.  Will wake up w/ palpitations.  sxs are worse at night and around menses.  Exercising 5 days/week.  Review of Systems Patient reports no vision/ hearing changes, adenopathy,fever, weight change,  persistant/recurrent hoarseness , swallowing issues, chest pain, palpitations, edema, persistant/recurrent cough, hemoptysis, dyspnea (rest/exertional/paroxysmal nocturnal), gastrointestinal bleeding (melena, rectal bleeding), abdominal pain, significant heartburn, bowel changes, GU symptoms (dysuria, hematuria, incontinence), Gyn symptoms (abnormal  bleeding, pain),  syncope, focal weakness, memory loss, numbness & tingling, skin/hair/nail changes, abnormal bruising or bleeding.     Objective:   Physical Exam General Appearance:    Alert, cooperative, no distress, appears stated age  Head:    Normocephalic, without obvious abnormality, atraumatic  Eyes:    PERRL, conjunctiva/corneas clear, EOM's intact, fundi    benign, both eyes  Ears:    Normal TM's and external ear canals, both ears  Nose:   Nares normal, septum midline, mucosa normal, no drainage    or sinus tenderness  Throat:   Lips, mucosa, and tongue normal; teeth and gums normal  Neck:   Supple, symmetrical, trachea midline, no adenopathy;    Thyroid: no enlargement/tenderness/nodules  Back:     Symmetric, no curvature, ROM normal, no CVA tenderness  Lungs:     Clear to auscultation bilaterally, respirations unlabored  Chest Wall:    No tenderness or deformity   Heart:    Regular rate and rhythm, S1 and S2 normal, no murmur, rub   or gallop    Breast Exam:    Deferred to GYN  Abdomen:     Soft, non-tender, bowel sounds active all four quadrants,    no masses, no organomegaly  Genitalia:    Deferred to GYN  Rectal:    Extremities:   Extremities normal, atraumatic, no cyanosis or edema  Pulses:   2+ and symmetric all extremities  Skin:   Skin color, texture, turgor normal, no rashes or lesions  Lymph nodes:   Cervical, supraclavicular, and axillary nodes normal  Neurologic:   CNII-XII intact, normal strength, sensation and reflexes    throughout          Assessment & Plan:

## 2011-11-05 NOTE — Patient Instructions (Signed)
Follow up in 1 month to recheck mood Start the Wellbutrin daily for the anxiety You look great!  Keep up the good work! Call with any questions or concerns Happy Holidays!!

## 2011-11-11 NOTE — Assessment & Plan Note (Signed)
Pt's sxs are not well controlled.  Having 'panic attacks' on regular basis.  Has not had success w/ previous meds.  Start Wellbutrin in attempt to control sxs.  Will follow closely.

## 2011-11-11 NOTE — Assessment & Plan Note (Signed)
Pt's PE WNL.  UTD on GYN.  Reports she had labs drawn.  Pt to have them sent.  Anticipatory guidance provided.

## 2011-11-23 ENCOUNTER — Telehealth: Payer: Self-pay | Admitting: *Deleted

## 2011-11-23 NOTE — Telephone Encounter (Signed)
Delsym or mucinex (plain or DM)  And antihistamine ---like claritin or zyrtec----ov if no better

## 2011-11-23 NOTE — Telephone Encounter (Signed)
Pt called to advise that she has cough and has lost her voice, only noted slight runny nose, no fever-chills-headache noted, pt stated this started 11-19-11 she has taken OTC Tussin for Cold/Cough/Congestion with no relief?

## 2011-11-23 NOTE — Telephone Encounter (Signed)
Spoke to pt to advise results/instructions. Pt understood.  

## 2011-12-05 ENCOUNTER — Other Ambulatory Visit: Payer: Self-pay | Admitting: *Deleted

## 2011-12-05 MED ORDER — BUPROPION HCL ER (XL) 150 MG PO TB24: 150.0000 mg | ORAL_TABLET | ORAL | Status: DC

## 2011-12-05 NOTE — Telephone Encounter (Signed)
rx sent to pharmacy by e-script Per noted sent with only #30 on 11-05-11 sent correct #90 per mail order requires the #90 no refills sent

## 2011-12-14 ENCOUNTER — Encounter: Payer: Self-pay | Admitting: Family Medicine

## 2011-12-14 ENCOUNTER — Ambulatory Visit (INDEPENDENT_AMBULATORY_CARE_PROVIDER_SITE_OTHER): Payer: 59 | Admitting: Family Medicine

## 2011-12-14 VITALS — BP 124/70 | HR 82 | Temp 98.1°F | Ht 66.5 in | Wt 152.4 lb

## 2011-12-14 DIAGNOSIS — J40 Bronchitis, not specified as acute or chronic: Secondary | ICD-10-CM | POA: Insufficient documentation

## 2011-12-14 MED ORDER — BENZONATATE 200 MG PO CAPS: 200.0000 mg | ORAL_CAPSULE | Freq: Three times a day (TID) | ORAL | Status: AC | PRN

## 2011-12-14 MED ORDER — GUAIFENESIN-CODEINE 100-10 MG/5ML PO SYRP: 10.0000 mL | ORAL_SOLUTION | Freq: Three times a day (TID) | ORAL | Status: AC | PRN

## 2011-12-14 MED ORDER — AZITHROMYCIN 250 MG PO TABS: 250.0000 mg | ORAL_TABLET | Freq: Every day | ORAL | Status: AC

## 2011-12-14 NOTE — Patient Instructions (Signed)
This is a bronchitis Start the Zpack as directed Drink plenty of fluids Use the cough meds as needed- syrup will make you drowsy, pills won't Add Mucinex to thin your congestion Ibuprofen for pain and airway inflammation Hang in there!

## 2011-12-14 NOTE — Assessment & Plan Note (Signed)
Pt's sxs and PE consistent w/ bronchitis.  Given duration of cough will start abx, cough meds.  Reviewed supportive care and red flags that should prompt return.  Pt expressed understanding and is in agreement w/ plan.

## 2011-12-14 NOTE — Progress Notes (Signed)
  Subjective:    Patient ID: Danielle Oneill, female    DOB: [birth year], 41 y.o.   MRN: [id number]  HPI Cough- sxs started on NYE.  Took Mucinex and Zyrtec which enabled voice to return.  For last week has had coughing fits to 'point I feel like i'm going to lose a lung'.  Cough is intermittently productive of yellow sputum.  Cough is causing HA.  No fevers.  No ear pain, sore throat, sinus pressure.   Review of Systems For ROS see HPI     Objective:   Physical Exam  Vitals reviewed. Constitutional: She appears well-developed and well-nourished. No distress.  HENT:  Head: Normocephalic and atraumatic.       TMs normal bilaterally Mild nasal congestion Throat w/out erythema, edema, or exudate  Eyes: Conjunctivae and EOM are normal. Pupils are equal, round, and reactive to light.  Neck: Normal range of motion. Neck supple.  Cardiovascular: Normal rate, regular rhythm, normal heart sounds and intact distal pulses.   No murmur heard. Pulmonary/Chest: Effort normal and breath sounds normal. No respiratory distress. She has no wheezes.       + hacking cough  Lymphadenopathy:    She has no cervical adenopathy.          Assessment & Plan:

## 2012-02-04 ENCOUNTER — Other Ambulatory Visit: Payer: Self-pay

## 2012-02-04 NOTE — Telephone Encounter (Signed)
Last seen 12/14/11 and filled 11/05/11 # 90. Please advise in Dr.Tabori's absence.    KP

## 2012-02-05 MED ORDER — ALPRAZOLAM 0.5 MG PO TABS: 0.5000 mg | ORAL_TABLET | ORAL | Status: DC

## 2012-02-05 NOTE — Telephone Encounter (Signed)
Ok to refill x 1  

## 2012-02-05 NOTE — Telephone Encounter (Signed)
Addended by: Arnette Norris on: 02/05/2012 11:19 AM   Modules accepted: Orders

## 2012-04-10 ENCOUNTER — Telehealth: Payer: Self-pay | Admitting: *Deleted

## 2012-04-10 NOTE — Telephone Encounter (Signed)
Pt left msg on triage vmail stating she needed a refill on med but pt did not specify.

## 2012-04-11 NOTE — Telephone Encounter (Signed)
Noted incoming fax for medication pt requested, MD Beverely Low will need to advise refill on Alprazolam refill, noted refill form received to be filled out by MD Beverely Low

## 2012-04-17 ENCOUNTER — Telehealth: Payer: Self-pay | Admitting: *Deleted

## 2012-04-17 MED ORDER — ALPRAZOLAM 0.5 MG PO TABS: 0.5000 mg | ORAL_TABLET | ORAL | Status: DC

## 2012-04-17 NOTE — Telephone Encounter (Signed)
Pt called to advise that her refill request for xanax has not been received at the [facility], noted fax for the medication request had been changed to a 120 tablets instead of the 90 she has been receiving, thus may have caused the hold up, apologized for inconvience and advised I will re-fax the prescription and call [facility] to advise reason for change, offered to send a few tablets to a local pharmacy to help her through til the mail order, pt declined per company wants her to use the [facility] only, faxed RX for Alprazolam 0.5mg  !20 with no refills take as directed TID/PRN for panic attacks and take 2 tablets PRN for insomnia, pt understood, left [facility] with [facility] to advise any questions per change to call office, left my extension. Faxed to [phone]

## 2012-06-09 ENCOUNTER — Other Ambulatory Visit: Payer: Self-pay | Admitting: Family Medicine

## 2012-06-09 NOTE — Telephone Encounter (Signed)
Last OV 12-14-11 acute OV, last refill 04-17-12 #120 no refills no upcoming apts noted

## 2012-06-09 NOTE — Telephone Encounter (Signed)
ALPRAZOLAM TABS 0.5MG  QTY:120 REFILLS: 0 TAKE 1 TABLET THREE TIMES A DAY AS NEEDED FOR PANIC ATTACKS AND TAKE 2 TABLETS NIGHTLY AS NEEDED FOR INSOMNIA AS DIRECTED

## 2012-06-10 MED ORDER — ALPRAZOLAM 0.5 MG PO TABS: 0.5000 mg | ORAL_TABLET | ORAL | Status: DC

## 2012-06-10 NOTE — Telephone Encounter (Signed)
Ok for #120 but remind pt that she needs to schedule her upcoming CPE

## 2012-06-10 NOTE — Telephone Encounter (Signed)
.  rx faxed to pharmacy, manually to [facility] Left vm to advise that she needs to call to set up CPE AFTER 11-04-12

## 2012-06-26 ENCOUNTER — Ambulatory Visit (INDEPENDENT_AMBULATORY_CARE_PROVIDER_SITE_OTHER): Payer: 59 | Admitting: Family Medicine

## 2012-06-26 VITALS — BP 119/75 | HR 88 | Temp 98.5°F | Ht 65.0 in | Wt 146.6 lb

## 2012-06-26 DIAGNOSIS — F411 Generalized anxiety disorder: Secondary | ICD-10-CM

## 2012-06-26 DIAGNOSIS — R55 Syncope and collapse: Secondary | ICD-10-CM | POA: Insufficient documentation

## 2012-06-26 MED ORDER — BUPROPION HCL 75 MG PO TABS: 75.0000 mg | ORAL_TABLET | Freq: Two times a day (BID) | ORAL | Status: DC

## 2012-06-26 NOTE — Progress Notes (Signed)
  Subjective:    Patient ID: Danielle Oneill, female    DOB: [birth year], 41 y.o.   MRN: [id number]  HPI 'outer body experience'- occurred in April w/ numbness and tingling of arm, went to UC and had normal blood work, EKG, PE.  Was told she had a panic attack.  2 weeks later had similar experience in [employer]- took a xanax and sxs mildly improved.  Occurred again last week while in [employer].  No passing out, no dizziness.  'i don't feel like myself'.  No new or different meds.  Doesn't feel Wellbutrin is working.  No longer on celexa or prozac.  Wants to wean off meds.  Feeling will last ~20 minutes.     Review of Systems For ROS see HPI     Objective:   Physical Exam  Vitals reviewed. Constitutional: She is oriented to person, place, and time. She appears well-developed and well-nourished. No distress.  HENT:  Head: Normocephalic and atraumatic.  Eyes: Conjunctivae and EOM are normal. Pupils are equal, round, and reactive to light.  Neck: Normal range of motion. Neck supple. No thyromegaly present.  Cardiovascular: Normal rate, regular rhythm, normal heart sounds and intact distal pulses.   No murmur heard. Pulmonary/Chest: Effort normal and breath sounds normal. No respiratory distress.  Abdominal: Soft. She exhibits no distension. There is no tenderness.  Musculoskeletal: She exhibits no edema.  Lymphadenopathy:    She has no cervical adenopathy.  Neurological: She is alert and oriented to person, place, and time.  Skin: Skin is warm and dry.  Psychiatric: Her behavior is normal.       Anxious, rapid- almost pressured- speech          Assessment & Plan:

## 2012-06-26 NOTE — Patient Instructions (Addendum)
This sounds like a vagal episode (when your BP drops and your HR speeds up to try and compensate) Continue to drink plenty of fluids Add a touch of salt to your diet Continue to exercise- this is perfectly safe! Start the Wellbutrin 75mg  twice daily x1 month and then decrease to 1 tab daily x1 month and then quit Call with any questions or concerns Hang in there!!!!

## 2012-06-27 LAB — CBC WITH DIFFERENTIAL/PLATELET
Basophils Absolute: 0 10*3/uL (ref 0.0–0.1)
Basophils Relative: 0.7 % (ref 0.0–3.0)
Eosinophils Absolute: 0.4 10*3/uL (ref 0.0–0.7)
Eosinophils Relative: 5.6 % — ABNORMAL HIGH (ref 0.0–5.0)
HCT: 40.6 % (ref 36.0–46.0)
Hemoglobin: 13.2 g/dL (ref 12.0–15.0)
Lymphocytes Relative: 35.3 % (ref 12.0–46.0)
Lymphs Abs: 2.6 10*3/uL (ref 0.7–4.0)
MCHC: 32.5 g/dL (ref 30.0–36.0)
MCV: 99.3 fl (ref 78.0–100.0)
Monocytes Absolute: 0.6 10*3/uL (ref 0.1–1.0)
Monocytes Relative: 8.1 % (ref 3.0–12.0)
Neutro Abs: 3.7 10*3/uL (ref 1.4–7.7)
Neutrophils Relative %: 50.3 % (ref 43.0–77.0)
Platelets: 306 10*3/uL (ref 150.0–400.0)
RBC: 4.08 Mil/uL (ref 3.87–5.11)
RDW: 14.1 % (ref 11.5–14.6)
WBC: 7.4 10*3/uL (ref 4.5–10.5)

## 2012-06-27 LAB — BASIC METABOLIC PANEL
BUN: 11 mg/dL (ref 6–23)
CO2: 28 mEq/L (ref 19–32)
Calcium: 9.5 mg/dL (ref 8.4–10.5)
Chloride: 103 mEq/L (ref 96–112)
Creatinine, Ser: 0.5 mg/dL (ref 0.4–1.2)
GFR: 134.61 mL/min (ref >60.00)
Glucose, Bld: 109 mg/dL — ABNORMAL HIGH (ref 70–99)
Potassium: 3.8 mEq/L (ref 3.5–5.1)
Sodium: 137 mEq/L (ref 135–145)

## 2012-06-27 LAB — TSH: TSH: 1.44 u[IU]/mL (ref 0.35–5.50)

## 2012-06-30 ENCOUNTER — Encounter: Payer: Self-pay | Admitting: *Deleted

## 2012-07-11 ENCOUNTER — Other Ambulatory Visit: Payer: Self-pay | Admitting: *Deleted

## 2012-07-11 NOTE — Telephone Encounter (Signed)
i don't prescribe controlled substances for menstrual cramps.  Pt has GYN- if this is what GYN was prescribing, she needs to call that office.  2 OTC Aleve twice daily works well.

## 2012-07-11 NOTE — Telephone Encounter (Signed)
Pt request med due to increase pain with menstrual. Please advise

## 2012-07-11 NOTE — Telephone Encounter (Signed)
Spoke to pt to advise results/instructions. Pt understood. Pt advised that MD Tabori prescribed for her before, noted RX sent by MD Beverely Low on 06-11-11 #30 no refills, pt notes that she hardly ever uses them, however notes the flare up with her endometriosis, advised that MD Beverely Low will possibly advise for her to contact the GYN due to her sxs per possible need for evaluation to rule out any need for treatment, pt noted that she does not want to contact GYN per she was already advised by them to have surgery and she does not want to per is aware that with surgery she will never be able to have a baby, pt advised that she is willing to come in for an office visit to discuss this with MD Beverely Low if she needs to, she also notes she has enough over the weekend if I could please ask her about this on Monday morning, advised I will call pt with next steps.

## 2012-07-11 NOTE — Telephone Encounter (Signed)
.  left message to have patient return my call.  

## 2012-07-13 NOTE — Assessment & Plan Note (Signed)
New.  Pt had normal w/u in ER.  Based on sxs reported, it sounds as if pt is having a vagal episode and then a subsequent panic attack b/c of how she feels.  Check labs to r/o abnormal thyroid, anemia, electrolyte imbalance.  Encouraged her to increase her fluid intake, change positions slowly, and if sxs return to call.  Pt expressed understanding and is in agreement w/ plan.

## 2012-07-13 NOTE — Assessment & Plan Note (Signed)
Unchanged.  Pt doesn't feel Wellbutrin is working and would like to wean off this.  Discussed that this may not be best considering she still has significant anxiety sxs but pt is firm that she wants to stop meds.  Will wean Wellbutrin as per pt instructions.

## 2012-07-13 NOTE — Telephone Encounter (Signed)
Ok for #30- but still needs to talk to GYN b/c if she doesn't want surgery they need to discuss other options

## 2012-07-14 MED ORDER — HYDROCODONE-IBUPROFEN 7.5-200 MG PO TABS: 1.0000 | ORAL_TABLET | Freq: Three times a day (TID) | ORAL | Status: DC | PRN

## 2012-07-14 NOTE — Telephone Encounter (Signed)
.  left message to have patient return my call to advise and clarify which pharmacy to send to

## 2012-07-14 NOTE — Telephone Encounter (Signed)
.  left message to have patient return my call to advise which pharmacy to send refill and to advise MD Tabori instructions

## 2012-07-15 NOTE — Telephone Encounter (Signed)
Pt returned call to advise to send to [facility], advised mail order only wants refills for 3 month supply and MD Tabori only will advise for #30 pt advised "That is ok, [facility]" pt advised she will discuss recurring pain with GYN for the next steps per does not want surgery.

## 2012-08-14 ENCOUNTER — Ambulatory Visit: Payer: 59 | Admitting: Family Medicine

## 2012-08-21 ENCOUNTER — Encounter: Payer: Self-pay | Admitting: Family Medicine

## 2012-08-21 ENCOUNTER — Ambulatory Visit (INDEPENDENT_AMBULATORY_CARE_PROVIDER_SITE_OTHER): Payer: 59 | Admitting: Family Medicine

## 2012-08-21 ENCOUNTER — Telehealth: Payer: Self-pay | Admitting: Family Medicine

## 2012-08-21 VITALS — BP 108/75 | HR 77 | Temp 98.4°F | Ht 64.0 in | Wt 146.0 lb

## 2012-08-21 DIAGNOSIS — M7650 Patellar tendinitis, unspecified knee: Secondary | ICD-10-CM

## 2012-08-21 DIAGNOSIS — Z23 Encounter for immunization: Secondary | ICD-10-CM

## 2012-08-21 MED ORDER — ALPRAZOLAM 0.5 MG PO TABS: 0.5000 mg | ORAL_TABLET | ORAL | Status: DC

## 2012-08-21 NOTE — Assessment & Plan Note (Signed)
New.  Pt w/out current pain but description and indicated area of pain consistent w/ patellar tendonitis.  NSAIDs and ice if pain recurs.  Reviewed supportive care and red flags that should prompt return.  Pt expressed understanding and is in agreement w/ plan.

## 2012-08-21 NOTE — Telephone Encounter (Signed)
Ok for #120- can print and we'll give at time of OV

## 2012-08-21 NOTE — Patient Instructions (Addendum)
This is most consistent w/ patellar tendonitis Often occurs w/ overuse such as kneeling or stairs Ibuprofen as needed ICE initially and then heat Call if no improvement or worsening Hang in there!!!

## 2012-08-21 NOTE — Progress Notes (Signed)
  Subjective:    Patient ID: Danielle Oneill, female    DOB: 1971-04-20, 41 y.o.   MRN: [id number]  HPI Knee pain- pt reports knees have been 'crunching for years' and will have intermittent pain.  Last week developed 'severe pain' in both knees.  Most often occurs w/ stairs- going down.  Sharp pains w/ bending, particularly anteriorly.  No effusion.  No redness or bruising.  No known injury.  Pain improves w/ advil and heat.  Pain has improved this week.     Review of Systems For ROS see HPI     Objective:   Physical Exam  Vitals reviewed. Constitutional: She appears well-developed and well-nourished. No distress.  Musculoskeletal: Normal range of motion. She exhibits no edema and no tenderness.       Full ROM of knees bilaterally + crepitus bilaterally No joint line tenderness No effusion No TTP over quad or patellar tendons  Neurological: She has normal reflexes. Coordination normal.  Psychiatric:       Anxious Rapid, nearly pressured speech          Assessment & Plan:

## 2012-08-21 NOTE — Telephone Encounter (Signed)
Pt has apt for this afternoon, last refill 06-10-12 #120 no refills last OV 06-26-12

## 2012-08-21 NOTE — Telephone Encounter (Signed)
Pt was given RX via MD Tabori during OV

## 2012-08-21 NOTE — Telephone Encounter (Signed)
Refill: Alprazolam tabs 0.5 mg #120. Take 1 tablet three times a day as needed for panic attacks and take 2 tablets nightly as needed for insomnia as directed.

## 2012-09-15 ENCOUNTER — Other Ambulatory Visit (HOSPITAL_COMMUNITY): Payer: Self-pay | Admitting: Obstetrics & Gynecology

## 2012-09-15 DIAGNOSIS — N979 Female infertility, unspecified: Secondary | ICD-10-CM

## 2012-09-22 ENCOUNTER — Ambulatory Visit (HOSPITAL_COMMUNITY)
Admission: RE | Admit: 2012-09-22 | Discharge: 2012-09-22 | Disposition: A | Discharge status: Home / Self Care | Payer: 59 | Source: Ambulatory Visit | Attending: Obstetrics & Gynecology | Admitting: Obstetrics & Gynecology

## 2012-09-22 DIAGNOSIS — N979 Female infertility, unspecified: Secondary | ICD-10-CM

## 2012-09-22 MED ORDER — IOHEXOL 300 MG/ML  SOLN: 10.0000 mL | Freq: Once | INTRAMUSCULAR | Status: AC | PRN

## 2012-10-03 ENCOUNTER — Telehealth: Payer: Self-pay | Admitting: Family Medicine

## 2012-10-03 MED ORDER — ALBUTEROL SULFATE HFA 108 (90 BASE) MCG/ACT IN AERS: 2.0000 | INHALATION_SPRAY | RESPIRATORY_TRACT | Status: DC | PRN

## 2012-10-03 NOTE — Telephone Encounter (Signed)
Refill: Albuterol (proair) hfa inh 90 mcg.

## 2012-10-03 NOTE — Telephone Encounter (Signed)
Rx sent 

## 2012-10-06 ENCOUNTER — Telehealth: Payer: Self-pay | Admitting: Family Medicine

## 2012-10-06 NOTE — Telephone Encounter (Signed)
Noted - pharmacy updated.

## 2012-10-06 NOTE — Telephone Encounter (Signed)
Patient called to let us know that she would like all prescriptions sent to [facility] from now on. She will be needing alprazolam in the next few months.

## 2012-10-08 ENCOUNTER — Other Ambulatory Visit: Payer: Self-pay | Admitting: Obstetrics & Gynecology

## 2012-10-08 DIAGNOSIS — Z1231 Encounter for screening mammogram for malignant neoplasm of breast: Secondary | ICD-10-CM

## 2012-10-27 ENCOUNTER — Ambulatory Visit (INDEPENDENT_AMBULATORY_CARE_PROVIDER_SITE_OTHER): Payer: 59 | Admitting: Family Medicine

## 2012-10-27 ENCOUNTER — Encounter: Payer: Self-pay | Admitting: Family Medicine

## 2012-10-27 VITALS — BP 100/70 | HR 72 | Temp 98.0°F | Ht 64.75 in | Wt 149.6 lb

## 2012-10-27 DIAGNOSIS — J40 Bronchitis, not specified as acute or chronic: Secondary | ICD-10-CM

## 2012-10-27 MED ORDER — GUAIFENESIN-CODEINE 100-10 MG/5ML PO SYRP: 10.0000 mL | ORAL_SOLUTION | Freq: Three times a day (TID) | ORAL | Status: DC | PRN

## 2012-10-27 MED ORDER — AZITHROMYCIN 250 MG PO TABS: ORAL_TABLET | ORAL | Status: DC

## 2012-10-27 NOTE — Patient Instructions (Addendum)
Start the Zpack as directed Use the cough syrup as needed- will cause drowsiness Add Mucinex to thin your chest congestion Drink plenty of fluids REST! Happy Holidays!!

## 2012-10-27 NOTE — Progress Notes (Signed)
  Subjective:    Patient ID: Danielle Oneill, female    DOB: 1971/07/18, 41 y.o.   MRN: [id number]  HPI ? Bronchitis- 'coughing for days.  Worse at night'.  Burns when taking a deep breath in lungs, increased use of inhaler (hx of asthma).  Cough is dry.  Using Robitussin DM w/out relief.  + nasal congestion.  + maxillary pressure.  + body aches.  No ear pain, fevers.  + sick contacts.   Review of Systems For ROS see HPI     Objective:   Physical Exam  Constitutional: She appears well-developed and well-nourished. No distress.  HENT:  Head: Normocephalic and atraumatic.       TMs normal bilaterally Mild nasal congestion Throat w/out erythema, edema, or exudate  Eyes: Conjunctivae normal and EOM are normal. Pupils are equal, round, and reactive to light.  Neck: Normal range of motion. Neck supple.  Cardiovascular: Normal rate, regular rhythm, normal heart sounds and intact distal pulses.   No murmur heard. Pulmonary/Chest: Effort normal and breath sounds normal. No respiratory distress. She has no wheezes.       + hacking cough  Lymphadenopathy:    She has no cervical adenopathy.          Assessment & Plan:

## 2012-10-27 NOTE — Assessment & Plan Note (Signed)
Pt's hx and PE consistent w/ bronchitis.  Due to hx of underlying lung dz (asthma) will start abx.  Cough meds prn.  Reviewed supportive care and red flags that should prompt return.  Pt expressed understanding and is in agreement w/ plan.

## 2012-11-06 ENCOUNTER — Ambulatory Visit (INDEPENDENT_AMBULATORY_CARE_PROVIDER_SITE_OTHER): Payer: 59

## 2012-11-06 DIAGNOSIS — Z1231 Encounter for screening mammogram for malignant neoplasm of breast: Secondary | ICD-10-CM

## 2012-11-07 ENCOUNTER — Encounter: Payer: 59 | Admitting: Family Medicine

## 2012-11-13 ENCOUNTER — Telehealth: Payer: Self-pay | Admitting: *Deleted

## 2012-11-13 NOTE — Telephone Encounter (Signed)
Pt left VM that advair will no longer be covered by her insurance. Pt states that she must do alternative dulera or symbicort. .Please advise

## 2012-11-14 MED ORDER — BUDESONIDE-FORMOTEROL FUMARATE 80-4.5 MCG/ACT IN AERO: 1.0000 | INHALATION_SPRAY | Freq: Two times a day (BID) | RESPIRATORY_TRACT | Status: DC

## 2012-11-14 NOTE — Telephone Encounter (Signed)
Patient returned your call. She would like a call back after 12 when she will be off work and available to speak with you.

## 2012-11-14 NOTE — Telephone Encounter (Signed)
Ok to switch to Symbicort 80/4.5- 1 puff twice daily, #1, 6 refills

## 2012-11-14 NOTE — Telephone Encounter (Signed)
Spoke with the pt and informed her that since her insurance will no longer cover the Advair, Dr. Beverely Low would like switch her to Symbicort 80/4.5 1 puff bid.  Pt agreed and new rx was sent to pharmacy by e-script.//AB/CMA

## 2012-11-14 NOTE — Telephone Encounter (Signed)
LM @ (9:30am) asking the pt to RTC.//AB/CMA

## 2012-11-18 ENCOUNTER — Other Ambulatory Visit: Payer: Self-pay | Admitting: *Deleted

## 2012-11-18 MED ORDER — ALBUTEROL SULFATE HFA 108 (90 BASE) MCG/ACT IN AERS: 2.0000 | INHALATION_SPRAY | RESPIRATORY_TRACT | Status: DC | PRN

## 2012-11-18 NOTE — Telephone Encounter (Signed)
Please advise on RF request.//AB/CMA  Refill request for the ProAir HFA was sent to the pharmacy by e-script.//AB/CMA

## 2012-11-18 NOTE — Telephone Encounter (Signed)
Please advise on RF request.//AB/CMA 

## 2012-11-19 NOTE — Telephone Encounter (Signed)
Ok for acyclovir #120, 1 refill

## 2012-11-20 MED ORDER — ACYCLOVIR 200 MG PO CAPS: 200.0000 mg | ORAL_CAPSULE | ORAL | Status: DC

## 2012-11-20 NOTE — Telephone Encounter (Signed)
Refill done.  

## 2012-11-24 ENCOUNTER — Telehealth: Payer: Self-pay | Admitting: Family Medicine

## 2012-11-24 MED ORDER — ALPRAZOLAM 0.5 MG PO TABS: 0.5000 mg | ORAL_TABLET | ORAL | Status: DC

## 2012-11-24 NOTE — Telephone Encounter (Signed)
Ok for #120, needs to sign agreement and do UDS if not already done

## 2012-11-24 NOTE — Telephone Encounter (Signed)
Spoke with the pt and informed her the RF request is ready, and she will need to pick it up and sign an agreement.  Pt agreed and asked if we could fax rx to [facility].//AB/CMA

## 2012-11-24 NOTE — Telephone Encounter (Signed)
Patient states she spoke with someone at length over a week ago regarding her alprazolam prescription. Patient states she is almost out and really needs this called into [facility]. Can leave detailed voicemail on [phone]

## 2012-11-24 NOTE — Telephone Encounter (Signed)
Please advise on RF request.//AB/CMA 

## 2012-11-27 ENCOUNTER — Other Ambulatory Visit: Payer: Self-pay | Admitting: *Deleted

## 2012-11-27 MED ORDER — ALBUTEROL SULFATE HFA 108 (90 BASE) MCG/ACT IN AERS: 2.0000 | INHALATION_SPRAY | RESPIRATORY_TRACT | Status: DC | PRN

## 2012-11-27 NOTE — Progress Notes (Signed)
Per Pt pharmacy did not receive on 11-18-12.rx resent

## 2012-12-05 ENCOUNTER — Telehealth: Payer: Self-pay | Admitting: Family Medicine

## 2012-12-17 ENCOUNTER — Other Ambulatory Visit: Payer: Self-pay | Admitting: *Deleted

## 2012-12-17 NOTE — Telephone Encounter (Signed)
On 11-26-12 spoke with Pam Berry(Pharm) at [facility] regarding refill request on Alprazolam 0.5mg .  Informed Pam that the pt was given a written rx for Alprazolam 0.5mg  on 11-25-12, which she took to an locator pharmacy ([facility]) to get filled because she had ran out.  And it was going to take longer to get the rx through the mail.  Informed her that the pt was told to call us 2 weeks prior to end of meds, and we can sent refill to [facility] per Dr. Beverely Low.  Pt agreed to that.  Pam agreed and was glad we call to inform them of this.//AB/CMA

## 2013-01-15 ENCOUNTER — Encounter: Payer: Self-pay | Admitting: Family Medicine

## 2013-02-11 ENCOUNTER — Telehealth: Payer: Self-pay | Admitting: *Deleted

## 2013-02-11 MED ORDER — ALPRAZOLAM 0.5 MG PO TABS: 0.5000 mg | ORAL_TABLET | ORAL | Status: DC

## 2013-02-11 NOTE — Telephone Encounter (Signed)
Last OV:06-26-12, last RF:11-24-12-#120.  Please advise.//AB/CMA

## 2013-02-11 NOTE — Telephone Encounter (Signed)
Ok for #120 

## 2013-04-06 ENCOUNTER — Ambulatory Visit (INDEPENDENT_AMBULATORY_CARE_PROVIDER_SITE_OTHER): Payer: 59 | Admitting: Family Medicine

## 2013-04-06 ENCOUNTER — Encounter: Payer: Self-pay | Admitting: Family Medicine

## 2013-04-06 VITALS — BP 104/70 | HR 87 | Temp 98.1°F | Ht 65.0 in | Wt 152.2 lb

## 2013-04-06 DIAGNOSIS — R200 Anesthesia of skin: Secondary | ICD-10-CM | POA: Insufficient documentation

## 2013-04-06 DIAGNOSIS — R202 Paresthesia of skin: Secondary | ICD-10-CM | POA: Insufficient documentation

## 2013-04-06 DIAGNOSIS — R209 Unspecified disturbances of skin sensation: Secondary | ICD-10-CM

## 2013-04-06 MED ORDER — ALBUTEROL SULFATE HFA 108 (90 BASE) MCG/ACT IN AERS: 2.0000 | INHALATION_SPRAY | RESPIRATORY_TRACT | Status: DC | PRN

## 2013-04-06 MED ORDER — CYCLOBENZAPRINE HCL 10 MG PO TABS: 10.0000 mg | ORAL_TABLET | Freq: Three times a day (TID) | ORAL | Status: DC | PRN

## 2013-04-06 MED ORDER — ALPRAZOLAM 0.5 MG PO TABS: 0.5000 mg | ORAL_TABLET | ORAL | Status: DC

## 2013-04-06 MED ORDER — NAPROXEN 500 MG PO TABS: 500.0000 mg | ORAL_TABLET | Freq: Two times a day (BID) | ORAL | Status: DC

## 2013-04-06 NOTE — Patient Instructions (Addendum)
Start the Naproxen twice daily- take w/ food Use the muscle relaxer (flexeril) nightly- will cause drowsiness Heat your neck Wear your wrist splint at night We'll call you with your ortho appt Call with any questions or concerns Hang in there!
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
## 2013-04-07 NOTE — Assessment & Plan Note (Signed)
New.  Pain appears to be coming from the neck- unclear if this is all muscular due to trap spasm or whether there is some degree of arthritic change in the neck.  Hand numbness/tingling may be carpal tunnel related.  Unclear as to what role pt's previous injury is playing but her trap spasm may be due to compensation.  Start muscle relaxers prn, scheduled NSAIDs, heat.  Will refer to ortho.  Reviewed supportive care and red flags that should prompt return.  Pt expressed understanding and is in agreement w/ plan.

## 2013-05-17 ENCOUNTER — Emergency Department (INDEPENDENT_AMBULATORY_CARE_PROVIDER_SITE_OTHER)
Admission: EM | Admit: 2013-05-17 | Discharge: 2013-05-17 | Disposition: A | Discharge status: Home / Self Care | Payer: 59 | Source: Home / Self Care | Attending: Family Medicine | Admitting: Family Medicine

## 2013-05-17 DIAGNOSIS — J069 Acute upper respiratory infection, unspecified: Secondary | ICD-10-CM

## 2013-05-17 DIAGNOSIS — J45901 Unspecified asthma with (acute) exacerbation: Secondary | ICD-10-CM

## 2013-05-17 MED ORDER — AZITHROMYCIN 250 MG PO TABS: ORAL_TABLET | ORAL | Status: DC

## 2013-05-17 MED ORDER — PREDNISONE 50 MG PO TABS: ORAL_TABLET | ORAL | Status: DC

## 2013-05-17 MED ORDER — METHYLPREDNISOLONE SODIUM SUCC 125 MG IJ SOLR
125.0000 mg | Freq: Once | INTRAMUSCULAR | Status: AC
  Administered 2013-05-17: 125 mg via INTRAMUSCULAR

## 2013-05-17 MED ORDER — IPRATROPIUM-ALBUTEROL 0.5-2.5 (3) MG/3ML IN SOLN
3.0000 mL | RESPIRATORY_TRACT | Status: DC
  Administered 2013-05-17: 3 mL via RESPIRATORY_TRACT

## 2013-05-17 MED ORDER — HYDROCOD POLST-CHLORPHEN POLST 10-8 MG/5ML PO LQCR: 5.0000 mL | Freq: Two times a day (BID) | ORAL | Status: DC | PRN
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
## 2013-05-20 ENCOUNTER — Telehealth: Payer: Self-pay | Admitting: *Deleted

## 2013-05-20 ENCOUNTER — Telehealth: Payer: Self-pay | Admitting: Family Medicine

## 2013-05-20 NOTE — Telephone Encounter (Signed)
Patient called stating she was seen at [facility] on 05/17/13 for URI and is still not any better. She would like to speak with a nurse about her options. She would like someone to call her back after 12 noon today. CB# [phone]

## 2013-05-20 NOTE — Telephone Encounter (Signed)
LVM for patient to return call. Need details? GF///RN

## 2013-06-22 ENCOUNTER — Telehealth: Payer: Self-pay | Admitting: Family Medicine

## 2013-06-22 NOTE — Telephone Encounter (Signed)
Patient is calling to request that a refill on her Xanax Rx to be sent to [facility]. She says that they were supposed to send [place] a request Monday 06/15/13.

## 2013-06-23 ENCOUNTER — Telehealth: Payer: Self-pay | Admitting: *Deleted

## 2013-06-23 MED ORDER — ALPRAZOLAM 0.5 MG PO TABS: 0.5000 mg | ORAL_TABLET | ORAL | Status: DC

## 2013-06-23 NOTE — Telephone Encounter (Signed)
Rx printed and faxed to the pharmacy.//AB/CMA 

## 2013-06-23 NOTE — Telephone Encounter (Signed)
RX faxed

## 2013-06-23 NOTE — Telephone Encounter (Signed)
Ok for #120, no refills 

## 2013-06-23 NOTE — Telephone Encounter (Signed)
Last refill:04-06-13 Last OV:04-06-13 Please advise.//AB/CMA

## 2013-06-25 MED ORDER — ALPRAZOLAM 0.5 MG PO TABS: 0.5000 mg | ORAL_TABLET | ORAL | Status: DC

## 2013-06-25 NOTE — Addendum Note (Signed)
Addended by: Verdie Shire on: 06/25/2013 08:36 AM   Modules accepted: Orders

## 2013-08-19 ENCOUNTER — Ambulatory Visit: Payer: 59

## 2013-08-19 LAB — HM PAP SMEAR

## 2013-08-20 ENCOUNTER — Ambulatory Visit (INDEPENDENT_AMBULATORY_CARE_PROVIDER_SITE_OTHER): Payer: 59

## 2013-08-20 DIAGNOSIS — Z23 Encounter for immunization: Secondary | ICD-10-CM

## 2013-09-02 ENCOUNTER — Telehealth: Payer: Self-pay

## 2013-09-02 NOTE — Telephone Encounter (Addendum)
  Medication and allergies: done  [facility] for 90 day supply (mail order) [facility] in [place] for local pharmacy   Immunizations due: t-dap due  A/P:  Last:  PAP:    Due Getting 09/04/2013     MMG: UTD 09/2012  Dexa: na CCS: na DM: Due 06/2012 HTN: Due 06/2012 Lipids:  Wants to have lipids checked  To Discuss with Provider: Would like a complete Thyroid work up/mother had thyroid cancer Lipid check-will be fasting "other" questions that she will discuss with MD

## 2013-09-04 ENCOUNTER — Encounter: Payer: Self-pay | Admitting: Family Medicine

## 2013-09-04 ENCOUNTER — Ambulatory Visit (INDEPENDENT_AMBULATORY_CARE_PROVIDER_SITE_OTHER): Payer: 59 | Admitting: Family Medicine

## 2013-09-04 VITALS — BP 112/74 | HR 83 | Temp 98.1°F | Resp 16 | Ht 65.5 in | Wt 150.4 lb

## 2013-09-04 DIAGNOSIS — F3289 Other specified depressive episodes: Secondary | ICD-10-CM

## 2013-09-04 DIAGNOSIS — Z0000 Encounter for general adult medical examination without abnormal findings: Secondary | ICD-10-CM

## 2013-09-04 DIAGNOSIS — F329 Major depressive disorder, single episode, unspecified: Secondary | ICD-10-CM

## 2013-09-04 LAB — CBC WITH DIFFERENTIAL/PLATELET
Basophils Absolute: 0.1 10*3/uL (ref 0.0–0.1)
Basophils Relative: 0.8 % (ref 0.0–3.0)
Eosinophils Absolute: 0.3 10*3/uL (ref 0.0–0.7)
Eosinophils Relative: 4.2 % (ref 0.0–5.0)
HCT: 39.7 % (ref 36.0–46.0)
Hemoglobin: 13.5 g/dL (ref 12.0–15.0)
Lymphocytes Relative: 26.4 % (ref 12.0–46.0)
Lymphs Abs: 1.7 10*3/uL (ref 0.7–4.0)
MCHC: 34.1 g/dL (ref 30.0–36.0)
MCV: 97.6 fl (ref 78.0–100.0)
Monocytes Absolute: 0.6 10*3/uL (ref 0.1–1.0)
Monocytes Relative: 8.6 % (ref 3.0–12.0)
Neutro Abs: 3.9 10*3/uL (ref 1.4–7.7)
Neutrophils Relative %: 60 % (ref 43.0–77.0)
Platelets: 282 10*3/uL (ref 150.0–400.0)
RBC: 4.07 Mil/uL (ref 3.87–5.11)
RDW: 14.2 % (ref 11.5–14.6)
WBC: 6.5 10*3/uL (ref 4.5–10.5)

## 2013-09-04 LAB — HEPATIC FUNCTION PANEL
ALT: 14 U/L (ref 0–35)
AST: 18 U/L (ref 0–37)
Albumin: 4.2 g/dL (ref 3.5–5.2)
Alkaline Phosphatase: 33 U/L — ABNORMAL LOW (ref 39–117)
Bilirubin, Direct: 0 mg/dL (ref 0.0–0.3)
Total Bilirubin: 0.5 mg/dL (ref 0.3–1.2)
Total Protein: 7 g/dL (ref 6.0–8.3)

## 2013-09-04 LAB — LDL CHOLESTEROL, DIRECT: Direct LDL: 101 mg/dL

## 2013-09-04 LAB — BASIC METABOLIC PANEL
BUN: 12 mg/dL (ref 6–23)
CO2: 24 mEq/L (ref 19–32)
Calcium: 9.2 mg/dL (ref 8.4–10.5)
Chloride: 104 mEq/L (ref 96–112)
Creatinine, Ser: 0.6 mg/dL (ref 0.4–1.2)
GFR: 107.67 mL/min (ref >60.00)
Glucose, Bld: 87 mg/dL (ref 70–99)
Potassium: 4 mEq/L (ref 3.5–5.1)
Sodium: 137 mEq/L (ref 135–145)

## 2013-09-04 LAB — LIPID PANEL
Cholesterol: 206 mg/dL — ABNORMAL HIGH (ref 0–200)
HDL: 91.4 mg/dL (ref >39.00)
Total CHOL/HDL Ratio: 2
Triglycerides: 70 mg/dL (ref 0.0–149.0)
VLDL: 14 mg/dL (ref 0.0–40.0)

## 2013-09-04 LAB — TSH: TSH: 0.94 u[IU]/mL (ref 0.35–5.50)

## 2013-09-04 MED ORDER — ESCITALOPRAM OXALATE 10 MG PO TABS: 10.0000 mg | ORAL_TABLET | Freq: Every day | ORAL | Status: DC

## 2013-09-04 NOTE — Patient Instructions (Signed)
Follow up 4-6 weeks to recheck mood Start the Lexapro daily We'll notify you of your lab results and make any changes if needed Keep up the good work!  You look great! Call with any questions or concerns Happy Early Birthday!!!!

## 2013-09-04 NOTE — Assessment & Plan Note (Signed)
Deteriorated.  Pt now having more bad days than good days.  Start SSRI.  Monitor closely for improvement.

## 2013-09-04 NOTE — Progress Notes (Signed)
  Subjective:    Patient ID: Danielle Oneill, female    DOB: 1971-08-16, 42 y.o.   MRN: [id number]  HPI CPE- seeing GYN later today, UTD on mammo.   Review of Systems Patient reports no vision/ hearing changes, adenopathy,fever, weight change,  persistant/recurrent hoarseness , swallowing issues, chest pain, palpitations, edema, persistant/recurrent cough, hemoptysis, dyspnea (rest/exertional/paroxysmal nocturnal), gastrointestinal bleeding (melena, rectal bleeding), abdominal pain, significant heartburn, bowel changes, GU symptoms (dysuria, hematuria, incontinence), Gyn symptoms (abnormal  bleeding, pain),  syncope, focal weakness, memory loss, numbness & tingling, skin/hair/nail changes, abnormal bruising or bleeding.  + depression- pt reports she has been feeling down all year, having increased panic attacks, sad frequently.  Withdrawing from things, losing joy.     Objective:   Physical Exam General Appearance:    Alert, cooperative, no distress, appears stated age  Head:    Normocephalic, without obvious abnormality, atraumatic  Eyes:    PERRL, conjunctiva/corneas clear, EOM's intact, fundi    benign, both eyes  Ears:    Normal TM's and external ear canals, both ears  Nose:   Nares normal, septum midline, mucosa normal, no drainage    or sinus tenderness  Throat:   Lips, mucosa, and tongue normal; teeth and gums normal  Neck:   Supple, symmetrical, trachea midline, no adenopathy;    Thyroid: no enlargement/tenderness/nodules  Back:     Symmetric, no curvature, ROM normal, no CVA tenderness  Lungs:     Clear to auscultation bilaterally, respirations unlabored  Chest Wall:    No tenderness or deformity   Heart:    Regular rate and rhythm, S1 and S2 normal, no murmur, rub   or gallop  Breast Exam:    Deferred to GYN  Abdomen:     Soft, non-tender, bowel sounds active all four quadrants,    no masses, no organomegaly  Genitalia:    Deferred to GYN  Rectal:    Extremities:    Extremities normal, atraumatic, no cyanosis or edema  Pulses:   2+ and symmetric all extremities  Skin:   Skin color, texture, turgor normal, no rashes or lesions  Lymph nodes:   Cervical, supraclavicular, and axillary nodes normal  Neurologic:   CNII-XII intact, normal strength, sensation and reflexes    throughout          Assessment & Plan:

## 2013-09-04 NOTE — Assessment & Plan Note (Signed)
Pt's PE WNL.  UTD on GYN.  Check labs.  Anticipatory guidance provided.  

## 2013-09-07 ENCOUNTER — Encounter: Payer: Self-pay | Admitting: General Practice

## 2013-09-10 ENCOUNTER — Telehealth: Payer: Self-pay | Admitting: *Deleted

## 2013-09-10 DIAGNOSIS — Z0000 Encounter for general adult medical examination without abnormal findings: Secondary | ICD-10-CM

## 2013-09-10 NOTE — Telephone Encounter (Signed)
Spoke with the pt and informed her that we will need to redraw the Vit D level.  Pt agreed and scheduled a lab appt(09-18-13 @ 8:15am),and future lab was ordered and sent.//AB/CMA

## 2013-09-18 ENCOUNTER — Other Ambulatory Visit: Payer: 59

## 2013-09-18 DIAGNOSIS — Z0000 Encounter for general adult medical examination without abnormal findings: Secondary | ICD-10-CM

## 2013-09-22 ENCOUNTER — Encounter: Payer: Self-pay | Admitting: General Practice

## 2013-09-22 LAB — VITAMIN D 1,25 DIHYDROXY
Vitamin D 1, 25 (OH)2 Total: 69 pg/mL (ref 18–72)
Vitamin D2 1, 25 (OH)2: <8 pg/mL
Vitamin D3 1, 25 (OH)2: 69 pg/mL

## 2013-09-24 ENCOUNTER — Other Ambulatory Visit: Payer: Self-pay | Admitting: Family Medicine

## 2013-09-24 ENCOUNTER — Other Ambulatory Visit: Payer: Self-pay

## 2013-09-24 DIAGNOSIS — Z1231 Encounter for screening mammogram for malignant neoplasm of breast: Secondary | ICD-10-CM

## 2013-10-05 ENCOUNTER — Telehealth: Payer: Self-pay | Admitting: Family Medicine

## 2013-10-05 MED ORDER — BUDESONIDE-FORMOTEROL FUMARATE 80-4.5 MCG/ACT IN AERO: 2.0000 | INHALATION_SPRAY | Freq: Two times a day (BID) | RESPIRATORY_TRACT | Status: DC

## 2013-10-05 NOTE — Telephone Encounter (Signed)
[facility]/  

## 2013-10-05 NOTE — Telephone Encounter (Signed)
Symbicort 80/4.5- 2 puffs BID

## 2013-10-05 NOTE — Telephone Encounter (Signed)
Please advise if ok to switch medication.

## 2013-10-05 NOTE — Telephone Encounter (Signed)
Patient states that she usually needs Advair rx'd in the winter time, however, she was told by [facility] that they no longer carry the drug. Patient is asking that a similar drug such as Symbicort  be rx'd to [facility]. Please advise.

## 2013-10-29 ENCOUNTER — Encounter: Payer: Self-pay | Admitting: Family Medicine

## 2013-11-09 ENCOUNTER — Ambulatory Visit
Admission: RE | Admit: 2013-11-09 | Discharge: 2013-11-09 | Disposition: A | Discharge status: Home / Self Care | Payer: 59 | Source: Ambulatory Visit

## 2013-11-09 ENCOUNTER — Encounter: Payer: Self-pay | Admitting: Internal Medicine

## 2013-11-09 ENCOUNTER — Ambulatory Visit (INDEPENDENT_AMBULATORY_CARE_PROVIDER_SITE_OTHER): Payer: 59 | Admitting: Internal Medicine

## 2013-11-09 ENCOUNTER — Telehealth: Payer: Self-pay | Admitting: Family Medicine

## 2013-11-09 VITALS — BP 131/80 | HR 84 | Temp 98.4°F | Wt 159.0 lb

## 2013-11-09 DIAGNOSIS — J45909 Unspecified asthma, uncomplicated: Secondary | ICD-10-CM

## 2013-11-09 DIAGNOSIS — Z1231 Encounter for screening mammogram for malignant neoplasm of breast: Secondary | ICD-10-CM

## 2013-11-09 MED ORDER — PREDNISONE 20 MG PO TABS: 20.0000 mg | ORAL_TABLET | Freq: Two times a day (BID) | ORAL | Status: DC

## 2013-11-09 NOTE — Progress Notes (Signed)
Pre visit review using our clinic review tool, if applicable. No additional management support is needed unless otherwise documented below in the visit note. 

## 2013-11-09 NOTE — Patient Instructions (Addendum)
Plain Mucinex (NOT D) for thick secretions ;force NON dairy fluids .   Nasal cleansing in the shower as discussed with lather of mild shampoo.After 10 seconds wash off lather while  exhaling through nostrils. Make sure that all residual soap is removed to prevent irritation.  Nasacort AQ OTC 1 spray in each nostril twice a day as needed. Use the "crossover" technique into opposite nostril spraying toward opposite ear @ 45 degree angle, not straight up into nostril.  Use a Neti pot daily only  as needed for significant sinus congestion; going from open side to congested side . Plain Allegra (NOT D )  160 daily , Loratidine 10 mg , OR Zyrtec 10 mg @ bedtime  as needed for itchy eyes & sneezing. Fill the  prescription for the Prednisone if no better in 36 hrs

## 2013-11-09 NOTE — Telephone Encounter (Signed)
Patient came in this morning with cold symptoms and was not given any medication. States that she was only rx'd something for her asthma and she needs a cough medicine. Please advise. Patient would like this sent to [facility] in [place].

## 2013-11-09 NOTE — Progress Notes (Signed)
   Subjective:    Patient ID: Danielle Oneill, female    DOB: 06-09-1971, 42 y.o.   MRN: [id number]  HPI  Symptoms began 12/20 as malaise associated with a dry cough. Subsequently she has developed some laryngitis. The cough has been associated with shortness of breath w/o  wheezing. She has increased her use of her rescue inhaler. She also used low-dose Symbcort one puff twice a day. She states that this is a recurrent phenomena when the weather turns cold  She has a history of asthma and bronchitis. She's never smoked    Review of Systems  She has not had associated frontal headache, facial pain, nasal purulence, dental pain, otic pain, or otic discharge. No fever , chills or sweats  She also denies extrinsic symptoms of itchy, watery eyes, or sneezing.     Objective:   Physical Exam General appearance:good health ;well nourished; no acute distress or increased work of breathing is present.  No  lymphadenopathy about the head, neck, or axilla noted.   Eyes: No conjunctival inflammation or lid edema is present.   Ears:  External ear exam shows no significant lesions or deformities.  Otoscopic examination reveals clear canals, tympanic membranes are intact bilaterally without bulging, retraction, inflammation or discharge.  Nose:  External nasal examination shows no deformity or inflammation. Nasal mucosa are pink and moist without lesions or exudates. No septal dislocation or deviation.No obstruction to airflow.   Oral exam: Dental hygiene is good; lips and gums are healthy appearing.There is no oropharyngeal erythema or exudate noted.   Neck:  No deformities,  masses, or tenderness noted.     Heart:  Normal rate and regular rhythm. S1 and S2 normal without gallop, murmur, click, rub or other extra sounds.   Lungs:Chest clear to auscultation; no wheezes, rhonchi,rales ,or rubs present.No increased work of breathing.    Extremities:  No cyanosis, edema, or clubbing  noted    Skin:  Warm & dry          Assessment & Plan:  #1 intrinsic asthma exacerbation related to cold air exposure. Her history is negative for infectious or extrinsic symptomatology.  Plan: See orders

## 2013-11-10 ENCOUNTER — Other Ambulatory Visit: Payer: Self-pay | Admitting: Internal Medicine

## 2013-11-10 DIAGNOSIS — R05 Cough: Secondary | ICD-10-CM

## 2013-11-10 DIAGNOSIS — R059 Cough, unspecified: Secondary | ICD-10-CM

## 2013-11-10 MED ORDER — HYDROCODONE-HOMATROPINE 5-1.5 MG/5ML PO SYRP: 5.0000 mL | ORAL_SOLUTION | Freq: Four times a day (QID) | ORAL | Status: DC | PRN

## 2013-11-10 NOTE — Telephone Encounter (Signed)
LM @ (11:54am) informing the pt that Dr. Sherrilee Gilles something for her cough.  Informed the pt that she will need to pick-up the rx will leave it at the front desk for her.//AB/CMA

## 2013-11-10 NOTE — Telephone Encounter (Signed)
See Rx please

## 2013-11-10 NOTE — Telephone Encounter (Signed)
Pt was seen by Dr Alwyn Ren yesterday- since he assessed her, he will need to decide about a cough medication

## 2013-11-20 ENCOUNTER — Other Ambulatory Visit: Payer: Self-pay | Admitting: General Practice

## 2013-11-20 ENCOUNTER — Telehealth: Payer: Self-pay | Admitting: Family Medicine

## 2013-11-20 MED ORDER — ALPRAZOLAM 0.5 MG PO TABS: 0.5000 mg | ORAL_TABLET | ORAL | Status: DC

## 2013-11-20 MED ORDER — ESCITALOPRAM OXALATE 10 MG PO TABS: 10.0000 mg | ORAL_TABLET | Freq: Every day | ORAL | Status: DC

## 2013-11-20 NOTE — Telephone Encounter (Signed)
We denied this earlier. Ok to fill?

## 2013-11-20 NOTE — Telephone Encounter (Signed)
Ok for 120 of xanax.  No Lexapro- pt was given 7 month supply in Oct

## 2013-11-20 NOTE — Telephone Encounter (Signed)
Med filled.  

## 2013-11-20 NOTE — Telephone Encounter (Signed)
Last OV 09-04-13 Xanax filled 06-25-13 #120 Alprazolam 09-04-13 #30 with 6

## 2013-11-20 NOTE — Telephone Encounter (Signed)
Patient's escitalopram (LEXAPRO) 10 MG tablet needs to be refilled to [facility]. She cannot pick up remaining refills from [facility] because of her insurance. She also wants to make sure her Alprazolam was sent to [facility]. Please advise.

## 2013-11-20 NOTE — Telephone Encounter (Signed)
Med filled, lexapro denied per [facility] due to 6 refills given in October.

## 2013-11-20 NOTE — Telephone Encounter (Signed)
[place] for #90, 3 refills to [facility]

## 2013-11-27 ENCOUNTER — Other Ambulatory Visit: Payer: Self-pay | Admitting: General Practice

## 2013-11-27 NOTE — Telephone Encounter (Signed)
Entered in error

## 2013-12-28 ENCOUNTER — Encounter: Payer: Self-pay | Admitting: Family

## 2013-12-28 ENCOUNTER — Ambulatory Visit (INDEPENDENT_AMBULATORY_CARE_PROVIDER_SITE_OTHER): Payer: 59 | Admitting: Family

## 2013-12-28 VITALS — BP 128/70 | HR 85 | Temp 98.2°F | Resp 16 | Ht 65.5 in | Wt 160.0 lb

## 2013-12-28 DIAGNOSIS — J069 Acute upper respiratory infection, unspecified: Secondary | ICD-10-CM | POA: Insufficient documentation

## 2013-12-28 MED ORDER — AMOXICILLIN 500 MG PO CAPS: 500.0000 mg | ORAL_CAPSULE | Freq: Three times a day (TID) | ORAL | Status: DC
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
## 2013-12-28 NOTE — Patient Instructions (Signed)
If symptoms are not improved in 2 days, then start amoxicillin.  You may try warm salt water gargles, ibuprofen and claritin D for symptoms.

## 2013-12-28 NOTE — Progress Notes (Signed)
Pre visit review using our clinic review tool, if applicable. No additional management support is needed unless otherwise documented below in the visit note. 

## 2013-12-28 NOTE — Assessment & Plan Note (Signed)
Symptoms most consistent with viral etiology at this point. Advised pt to continue ibuprofen, hydration, rest, add claritin D, salt water gargles.  If no improvement in 2-3 days start amoxicillin.

## 2014-01-29 ENCOUNTER — Ambulatory Visit: Payer: 59 | Admitting: Family Medicine

## 2014-02-12 ENCOUNTER — Ambulatory Visit: Payer: 59 | Admitting: Family Medicine

## 2014-02-14 ENCOUNTER — Other Ambulatory Visit: Payer: Self-pay | Admitting: Family Medicine

## 2014-02-15 ENCOUNTER — Telehealth: Payer: Self-pay | Admitting: Family Medicine

## 2014-02-15 MED ORDER — ALPRAZOLAM 0.5 MG PO TABS: 0.5000 mg | ORAL_TABLET | ORAL | Status: DC

## 2014-02-15 NOTE — Telephone Encounter (Signed)
[place] for #120 but needs UDS

## 2014-02-15 NOTE — Telephone Encounter (Signed)
Med filled.  

## 2014-02-15 NOTE — Telephone Encounter (Signed)
Last OV ([facility]PE [facility]) 09-04-13 Xanax filled 11-20-13 #120 with 0  Moderate risk , due for uds

## 2014-02-15 NOTE — Telephone Encounter (Signed)
Patient called and requested a refill for ALPRAZolam Danielle Oneill) 0.5 MG tablet [facility]acy  [facility]

## 2014-03-05 ENCOUNTER — Ambulatory Visit: Payer: 59 | Admitting: Family Medicine

## 2014-03-26 ENCOUNTER — Encounter: Payer: Self-pay | Admitting: Family Medicine

## 2014-03-26 ENCOUNTER — Ambulatory Visit (INDEPENDENT_AMBULATORY_CARE_PROVIDER_SITE_OTHER): Payer: 59 | Admitting: Family Medicine

## 2014-03-26 VITALS — BP 120/72 | HR 86 | Temp 98.6°F | Resp 16 | Wt 159.0 lb

## 2014-03-26 DIAGNOSIS — J45909 Unspecified asthma, uncomplicated: Secondary | ICD-10-CM

## 2014-03-26 DIAGNOSIS — F329 Major depressive disorder, single episode, unspecified: Secondary | ICD-10-CM

## 2014-03-26 DIAGNOSIS — F3289 Other specified depressive episodes: Secondary | ICD-10-CM

## 2014-03-26 MED ORDER — BUDESONIDE-FORMOTEROL FUMARATE 80-4.5 MCG/ACT IN AERO: 2.0000 | INHALATION_SPRAY | Freq: Two times a day (BID) | RESPIRATORY_TRACT | Status: DC

## 2014-03-26 MED ORDER — ALPRAZOLAM 0.5 MG PO TABS: 0.5000 mg | ORAL_TABLET | ORAL | Status: DC

## 2014-03-26 MED ORDER — ACYCLOVIR 200 MG PO CAPS: 200.0000 mg | ORAL_CAPSULE | ORAL | Status: DC

## 2014-03-26 MED ORDER — ZOLPIDEM TARTRATE 10 MG PO TABS: 10.0000 mg | ORAL_TABLET | Freq: Every evening | ORAL | Status: DC | PRN

## 2014-03-26 MED ORDER — ALBUTEROL SULFATE HFA 108 (90 BASE) MCG/ACT IN AERS: INHALATION_SPRAY | RESPIRATORY_TRACT | Status: DC

## 2014-03-26 MED ORDER — ESCITALOPRAM OXALATE 10 MG PO TABS
10.0000 mg | ORAL_TABLET | Freq: Every day | ORAL | Status: DC
Start: 2014-03-26 — End: 2014-09-17

## 2014-03-26 NOTE — Patient Instructions (Signed)
Schedule your complete physical for October Continue the Lexapro daily Call with any questions or concerns [place]!!!

## 2014-03-26 NOTE — Assessment & Plan Note (Signed)
Chronic problem, much improved since starting Lexapro.  No changes.  Refills provided.

## 2014-03-26 NOTE — Assessment & Plan Note (Signed)
Well controlled  Refills provided

## 2014-03-26 NOTE — Progress Notes (Signed)
Pre visit review using our clinic review tool, if applicable. No additional management support is needed unless otherwise documented below in the visit note. 

## 2014-03-26 NOTE — Progress Notes (Signed)
   Subjective:    Patient ID: Danielle Oneill, female    DOB: 12/30/1970, 43 y.o.   MRN: [id number]  HPI Depression/anxiety- chronic problem, on Lexapro daily.  Pt reports feeling much better since starting med.  No side effects, 'i feel good'.  'it's a miracle'.    Review of Systems For ROS see HPI     Objective:   Physical Exam  Vitals reviewed. Constitutional: She is oriented to person, place, and time. She appears well-developed and well-nourished. No distress.  HENT:  Head: Normocephalic and atraumatic.  Neurological: She is alert and oriented to person, place, and time.  Skin: Skin is warm and dry.  Psychiatric: She has a normal mood and affect. Her behavior is normal. Thought content normal.          Assessment & Plan:

## 2014-06-28 ENCOUNTER — Telehealth: Payer: Self-pay

## 2014-06-28 NOTE — Telephone Encounter (Signed)
Requesting Alprazolam 0.5mg -Take 1 tablet by mouth as directed,1 po tid prn for panic attacks and 2 po nightly prn insomnia. Last refill:03/26/14;#120,0 Last OV:03/26/14 UDS:09/04/13 Please advise.//AB/CMA

## 2014-06-28 NOTE — Telephone Encounter (Signed)
Danielle Oneill called to follow up on a refill for her ALPRAZolam Duanne Moron) 0.5 MG tablet the pharmacy had told her to call to make sure we got their fax. Let her know if any problems.

## 2014-06-28 NOTE — Telephone Encounter (Signed)
[place] for [facility]

## 2014-06-29 MED ORDER — ALPRAZOLAM 0.5 MG PO TABS: 0.5000 mg | ORAL_TABLET | ORAL | Status: DC

## 2014-06-29 NOTE — Telephone Encounter (Signed)
Med filled and faxed.  

## 2014-09-01 ENCOUNTER — Telehealth: Payer: Self-pay | Admitting: Family Medicine

## 2014-09-01 NOTE — Telephone Encounter (Signed)
error 

## 2014-09-06 ENCOUNTER — Telehealth: Payer: Self-pay | Admitting: General Practice

## 2014-09-06 MED ORDER — ALPRAZOLAM 0.5 MG PO TABS: 0.5000 mg | ORAL_TABLET | ORAL | Status: DC

## 2014-09-06 NOTE — Telephone Encounter (Signed)
Last OV 03-26-14 Alprazolam last filled 06-29-14 #120 with 0

## 2014-09-06 NOTE — Telephone Encounter (Signed)
[place] for [facility]

## 2014-09-06 NOTE — Telephone Encounter (Signed)
Med filled and faxed.  

## 2014-09-07 MED ORDER — ALPRAZOLAM 0.5 MG PO TABS: 0.5000 mg | ORAL_TABLET | ORAL | Status: DC

## 2014-09-07 NOTE — Telephone Encounter (Signed)
Med filled and faxed.  

## 2014-09-07 NOTE — Addendum Note (Signed)
Addended by: Katherine Roan L on: 09/07/2014 11:40 AM   Modules accepted: Orders

## 2014-09-07 NOTE — Telephone Encounter (Signed)
Caller name: Sharonne  Call back number: [phone] Pharmacy: [facility] in [place]  Reason for call:  Pt is wanting to know if we can send a couple days worth of the Alprazolam to the [facility] until [facility].

## 2014-09-07 NOTE — Telephone Encounter (Signed)
[place] for #20, to local pharmacy

## 2014-09-08 ENCOUNTER — Other Ambulatory Visit: Payer: Self-pay | Admitting: General Practice

## 2014-09-08 NOTE — Telephone Encounter (Signed)
error 

## 2014-09-17 ENCOUNTER — Encounter: Payer: Self-pay | Admitting: Family Medicine

## 2014-09-17 ENCOUNTER — Ambulatory Visit (INDEPENDENT_AMBULATORY_CARE_PROVIDER_SITE_OTHER): Payer: 59 | Admitting: Family Medicine

## 2014-09-17 VITALS — BP 122/74 | HR 91 | Temp 98.1°F | Resp 16 | Wt 157.5 lb

## 2014-09-17 DIAGNOSIS — F411 Generalized anxiety disorder: Secondary | ICD-10-CM

## 2014-09-17 DIAGNOSIS — Z23 Encounter for immunization: Secondary | ICD-10-CM

## 2014-09-17 DIAGNOSIS — G4700 Insomnia, unspecified: Secondary | ICD-10-CM

## 2014-09-17 MED ORDER — ZOLPIDEM TARTRATE 10 MG PO TABS: 10.0000 mg | ORAL_TABLET | Freq: Every evening | ORAL | Status: DC | PRN

## 2014-09-17 MED ORDER — ALBUTEROL SULFATE HFA 108 (90 BASE) MCG/ACT IN AERS: INHALATION_SPRAY | RESPIRATORY_TRACT | Status: DC

## 2014-09-17 MED ORDER — BUDESONIDE-FORMOTEROL FUMARATE 80-4.5 MCG/ACT IN AERO: 2.0000 | INHALATION_SPRAY | Freq: Two times a day (BID) | RESPIRATORY_TRACT | Status: DC

## 2014-09-17 MED ORDER — ESCITALOPRAM OXALATE 10 MG PO TABS: 10.0000 mg | ORAL_TABLET | Freq: Every day | ORAL | Status: DC

## 2014-09-17 MED ORDER — ACYCLOVIR 200 MG PO CAPS: 200.0000 mg | ORAL_CAPSULE | ORAL | Status: DC

## 2014-09-17 NOTE — Patient Instructions (Signed)
Schedule your complete physical in 6 months Keep up the good work on healthy diet and regular exercise Call with any questions or concerns Happy [place] Rudene Anda!!!

## 2014-09-17 NOTE — Progress Notes (Signed)
   Subjective:    Patient ID: Danielle Oneill, female    DOB: [birth year], 43 y.o.   MRN: [id number]  HPI Overweight- pt is exercising 4x/week and 'eating pretty well' and 'i don't look like i want to look'.  'anything else to do?'  Anxiety- chronic problem, on Lexapro, Ambien and xanax prn.  Pt reports waking up w/ heart racing.  Admits to being active dreamer.  Reports sxs are otherwise well controlled.  No side effects from meds.  Rarely taking Ambien.   Review of Systems For ROS see HPI     Objective:   Physical Exam  Constitutional: She is oriented to person, place, and time. She appears well-developed and well-nourished. No distress.  HENT:  Head: Normocephalic and atraumatic.  Eyes: Conjunctivae and EOM are normal. Pupils are equal, round, and reactive to light.  Neck: Normal range of motion. Neck supple. No thyromegaly present.  Cardiovascular: Normal rate, regular rhythm, normal heart sounds and intact distal pulses.   No murmur heard. Pulmonary/Chest: Effort normal and breath sounds normal. No respiratory distress.  Abdominal: Soft. She exhibits no distension. There is no tenderness.  Musculoskeletal: She exhibits no edema.  Lymphadenopathy:    She has no cervical adenopathy.  Neurological: She is alert and oriented to person, place, and time.  Skin: Skin is warm and dry.  Psychiatric: She has a normal mood and affect. Her behavior is normal.  Vitals reviewed.         Assessment & Plan:

## 2014-09-17 NOTE — Progress Notes (Signed)
Pre visit review using our clinic review tool, if applicable. No additional management support is needed unless otherwise documented below in the visit note. 

## 2014-09-19 NOTE — Assessment & Plan Note (Signed)
Chronic problem.  Pt reports sxs are well controlled.  Not interested in changing meds at this time.  Will follow.

## 2014-09-19 NOTE — Assessment & Plan Note (Signed)
Chronic problem.  Pt rarely requiring Ambien b/c sxs improved as anxiety improved.  Will continue to follow.

## 2014-09-20 ENCOUNTER — Telehealth: Payer: Self-pay | Admitting: Family Medicine

## 2014-09-20 MED ORDER — ALPRAZOLAM 0.5 MG PO TABS: 0.5000 mg | ORAL_TABLET | ORAL | Status: DC

## 2014-09-20 NOTE — Telephone Encounter (Signed)
[facility] SAYS THEY HAVE NOT GOTTEN THE REFILL REQUEST FOR HER ALPRAZOLAM

## 2014-09-20 NOTE — Telephone Encounter (Signed)
Med was refaxed again today.

## 2014-10-19 LAB — HM MAMMOGRAPHY: HM Mammogram: NORMAL

## 2014-11-15 ENCOUNTER — Telehealth: Payer: Self-pay | Admitting: Family Medicine

## 2014-11-15 NOTE — Telephone Encounter (Signed)
[place] for #120, 1 refill

## 2014-11-15 NOTE — Telephone Encounter (Addendum)
Medication Detail      Disp Refills Start End     ALPRAZolam (XANAX) 0.5 MG tablet 120 tablet 0 09/20/2014     Sig - Route: Take 1 tablet (0.5 mg total) by mouth as directed. 1 po tid prn for panic attacks AND 2 po nightly prn insomnia - Oral    Class: [facility]   Refill request for Alprazolam [to be sent to [facility]] Last filled by MD on - 11.02.15, #120x0 Last AEX - 10.30.15 Next AEX - 6-Mths. Please Advise on refills/SLS

## 2014-11-15 NOTE — Telephone Encounter (Signed)
Pt would like to know if she can get 5 pills  Sent to her local pharmacy[facility] in [place], states she only has 3 pills left and [facility] 8 days to get to her.

## 2014-11-15 NOTE — Telephone Encounter (Signed)
Rx request faxed to mail order pharmacy/SLS

## 2014-11-15 NOTE — Telephone Encounter (Signed)
Caller name: Johana Relation to pt: Call back number: [phone] [facility]acy[facility]  Reason for call: Pt called in for rx ALPRAZolam (XANAX) 0.5 MG tablet, pt states that only has for this week available and that [facility] for requesting rx in the morning today. Pt states was not aware almost out of med. Please advise.

## 2014-11-16 MED ORDER — ALPRAZOLAM 0.5 MG PO TABS: 0.5000 mg | ORAL_TABLET | ORAL | Status: DC

## 2014-11-16 NOTE — Telephone Encounter (Signed)
Error/gd °

## 2014-11-16 NOTE — Addendum Note (Signed)
Addended by: Kris Hartmann on: 11/16/2014 08:29 AM   Modules accepted: Orders

## 2014-11-16 NOTE — Telephone Encounter (Signed)
[place] for #30 to local pharmacy (co-pay will likely be the same for 5 or 30)

## 2014-11-16 NOTE — Telephone Encounter (Signed)
Med sent to pharmacy.

## 2014-11-24 ENCOUNTER — Ambulatory Visit (INDEPENDENT_AMBULATORY_CARE_PROVIDER_SITE_OTHER): Payer: 59 | Admitting: Family

## 2014-11-24 ENCOUNTER — Encounter: Payer: Self-pay | Admitting: Family

## 2014-11-24 VITALS — BP 105/88 | HR 81 | Temp 98.0°F | Resp 16 | Ht 66.0 in | Wt 160.8 lb

## 2014-11-24 DIAGNOSIS — R310 Gross hematuria: Secondary | ICD-10-CM

## 2014-11-24 DIAGNOSIS — N390 Urinary tract infection, site not specified: Secondary | ICD-10-CM | POA: Insufficient documentation

## 2014-11-24 DIAGNOSIS — N3001 Acute cystitis with hematuria: Secondary | ICD-10-CM

## 2014-11-24 LAB — POCT URINALYSIS DIPSTICK
Bilirubin, UA: NEGATIVE
Glucose, UA: NEGATIVE
Ketones, UA: NEGATIVE
Nitrite, UA: NEGATIVE
Spec Grav, UA: 1.015
Urobilinogen, UA: NEGATIVE
pH, UA: 6

## 2014-11-24 MED ORDER — CIPROFLOXACIN HCL 500 MG PO TABS: 500.0000 mg | ORAL_TABLET | Freq: Two times a day (BID) | ORAL | Status: DC

## 2014-11-24 NOTE — Patient Instructions (Signed)
Start cipro for urinary tract infection. Call if symptoms worsen, if fever >101 or if not improved in 3 days.   Urinary Tract Infection Urinary tract infections (UTIs) can develop anywhere along your urinary tract. Your urinary tract is your body's drainage system for removing wastes and extra water. Your urinary tract includes two kidneys, two ureters, a bladder, and a urethra. Your kidneys are a pair of bean-shaped organs. Each kidney is about the size of your fist. They are located below your ribs, one on each side of your spine. CAUSES Infections are caused by microbes, which are microscopic organisms, including fungi, viruses, and bacteria. These organisms are so small that they can only be seen through a microscope. Bacteria are the microbes that most commonly cause UTIs. SYMPTOMS  Symptoms of UTIs may vary by age and gender of the patient and by the location of the infection. Symptoms in young women typically include a frequent and intense urge to urinate and a painful, burning feeling in the bladder or urethra during urination. Older women and men are more likely to be tired, shaky, and weak and have muscle aches and abdominal pain. A fever may mean the infection is in your kidneys. Other symptoms of a kidney infection include pain in your back or sides below the ribs, nausea, and vomiting. DIAGNOSIS To diagnose a UTI, your caregiver will ask you about your symptoms. Your caregiver also will ask to provide a urine sample. The urine sample will be tested for bacteria and white blood cells. White blood cells are made by your body to help fight infection. TREATMENT  Typically, UTIs can be treated with medication. Because most UTIs are caused by a bacterial infection, they usually can be treated with the use of antibiotics. The choice of antibiotic and length of treatment depend on your symptoms and the type of bacteria causing your infection. HOME CARE INSTRUCTIONS  If you were prescribed  antibiotics, take them exactly as your caregiver instructs you. Finish the medication even if you feel better after you have only taken some of the medication.  Drink enough water and fluids to keep your urine clear or pale yellow.  Avoid caffeine, tea, and carbonated beverages. They tend to irritate your bladder.  Empty your bladder often. Avoid holding urine for long periods of time.  Empty your bladder before and after sexual intercourse.  After a bowel movement, women should cleanse from front to back. Use each tissue only once. SEEK MEDICAL CARE IF:   You have back pain.  You develop a fever.  Your symptoms do not begin to resolve within 3 days. SEEK IMMEDIATE MEDICAL CARE IF:   You have severe back pain or lower abdominal pain.  You develop chills.  You have nausea or vomiting.  You have continued burning or discomfort with urination. MAKE SURE YOU:   Understand these instructions.  Will watch your condition.  Will get help right away if you are not doing well or get worse. Document Released: 08/15/2005 Document Revised: 05/06/2012 Document Reviewed: 12/14/2011 [facility] Patient Information 2015 [place], [place]. This information is not intended to replace advice given to you by your health care provider. Make sure you discuss any questions you have with your health care provider.

## 2014-11-24 NOTE — Progress Notes (Signed)
.  lbpcmch

## 2014-11-24 NOTE — Progress Notes (Signed)
Subjective:    Patient ID: Danielle Oneill, female    DOB: [birth year], 44 y.o.   MRN: [id number]  HPI Danielle Oneill presents today with chief complaint of urinary frequency and dysuria. She denies associated fever or low back pain. Symptoms began early this morning. Reports associated lower abdominal pain/pressure.   Review of Systems  Constitutional: Positive for fatigue. Negative for fever and chills.  Genitourinary: Positive for urgency and hematuria.  Musculoskeletal:       Has some lower back pain this morning along with urinary sx.   Denies flank pain.  Past Medical History  Diagnosis Date  . Asthma   . Anxiety   . Depression   . Urinary incontinence     History   Social History  . Marital Status: Married    Spouse Name: N/A    Number of Children: N/A  . Years of Education: N/A   Occupational History  . Not on file.   Social History Main Topics  . Smoking status: Never Smoker   . Smokeless tobacco: Not on file  . Alcohol Use: Yes     Comment: occ. wine  . Drug Use: No  . Sexual Activity: Not on file   Other Topics Concern  . Not on file   Social History Narrative    Past Surgical History  Procedure Laterality Date  . Orthopedic surgery      shattered both elbows and left wrist, age 44  . Oophorectomy  2009    left  . Pelvic fracture surgery  05/2009    Family History  Problem Relation Age of Onset  . Asthma Father   . Thyroid disease Mother     No Known Allergies  Current Outpatient Prescriptions on File Prior to Visit  Medication Sig Dispense Refill  . acyclovir (ZOVIRAX) 200 MG capsule Take 1 capsule (200 mg total) by mouth every 4 (four) hours while awake. 120 capsule 1  . albuterol (PROAIR HFA) 108 (90 BASE) MCG/ACT inhaler INHALE 2 PUFFS INTO THE LUNGS EVERY 4 (FOUR) HOURS AS NEEDED 3 each 2  . ALPRAZolam (XANAX) 0.5 MG tablet Take 1 tablet (0.5 mg total) by mouth as directed. 1 po tid prn for panic attacks AND 2 po nightly prn insomnia  120 tablet 0  . escitalopram (LEXAPRO) 10 MG tablet Take 1 tablet (10 mg total) by mouth daily. 90 tablet 1  . zolpidem (AMBIEN) 10 MG tablet Take 1 tablet (10 mg total) by mouth at bedtime as needed. 90 tablet 1   No current facility-administered medications on file prior to visit.    BP 105/88 mmHg  Pulse 81  Temp(Src) 98 F (36.7 C) (Oral)  Resp 16  Ht 5\' 6"  (1.676 m)  Wt 160 lb 12.8 oz (72.938 kg)  BMI 25.97 kg/m2  SpO2 99%  LMP 11/17/2014       Objective:   Physical Exam  Constitutional: She is oriented to person, place, and time. She appears well-developed and well-nourished.  Cardiovascular: Normal rate and regular rhythm.  Exam reveals no gallop and no friction rub.   No murmur heard. Pulmonary/Chest: Effort normal and breath sounds normal. No respiratory distress. She has no wheezes.  Abdominal: There is no CVA tenderness.  Neurological: She is alert and oriented to person, place, and time.  Skin: Skin is warm and dry.  Psychiatric: She has a normal mood and affect. Her behavior is normal. Thought content normal.         Assessment & Plan:  Patient seen along with [facility] NP-student.  I have personally seen and examined patient and agree with Danielle Oneill's assessment and plan.

## 2014-11-24 NOTE — Assessment & Plan Note (Addendum)
New. POC UA shows 2+ leukocytes and  3+blood. Send urine for culture. Rx for Cipro and OTC AZO if needed.

## 2014-11-26 LAB — URINE CULTURE: Colony Count: 75000

## 2014-12-24 ENCOUNTER — Encounter: Payer: Self-pay | Admitting: Internal Medicine

## 2014-12-24 ENCOUNTER — Ambulatory Visit (INDEPENDENT_AMBULATORY_CARE_PROVIDER_SITE_OTHER): Payer: 59 | Admitting: Internal Medicine

## 2014-12-24 VITALS — BP 136/82 | HR 98 | Temp 98.4°F | Ht 66.0 in | Wt 159.5 lb

## 2014-12-24 DIAGNOSIS — J0100 Acute maxillary sinusitis, unspecified: Secondary | ICD-10-CM

## 2014-12-24 MED ORDER — HYDROCODONE-HOMATROPINE 5-1.5 MG/5ML PO SYRP: 5.0000 mL | ORAL_SOLUTION | Freq: Every evening | ORAL | Status: DC | PRN

## 2014-12-24 MED ORDER — AMOXICILLIN-POT CLAVULANATE 875-125 MG PO TABS: 1.0000 | ORAL_TABLET | Freq: Two times a day (BID) | ORAL | Status: DC

## 2014-12-24 NOTE — Patient Instructions (Signed)
Rest, fluids , tylenol  take Mucinex DM twice a day x 1 week then  as needed  Use albuterol 2 puffs every night for one week, then as needed Hydrocodone if you have persisting cough at night, will cause drowsiness OTC Nasocort or Flonase : 2 nasal sprays on each side of the nose daily until you feel better  Take the antibiotic as prescribed  (Augmentin ) Call if not gradually better over the next  10 days Call anytime if the symptoms are severe

## 2014-12-24 NOTE — Progress Notes (Signed)
Subjective:    Patient ID: Danielle Oneill, female    DOB: 11-15-1971, 43 y.o.   MRN: [id number]  DOS:  12/24/2014 Type of visit - description : acute Interval history: Not feeling well for 4 weeks: Persisting cough, worse at night, can't sleep, she brings up yellow sputum. Facial pressure at the maxillary areas. No energy. Went to the urgent care last week, was prescribed an antibiotic. History of asthma, initially had wheezing but not anymore.    Review of Systems Does not recall having fevers, occasionally hot and cold No difficulty breathing + Runny nose but no itching in the eyes or nose No nausea, vomiting, diarrhea or GERD Ongoing aches and pains  Past Medical History  Diagnosis Date  . Asthma   . Anxiety   . Depression   . Urinary incontinence     Past Surgical History  Procedure Laterality Date  . Orthopedic surgery      shattered both elbows and left wrist, age 26  . Oophorectomy  2009    left  . Pelvic fracture surgery  05/2009    History   Social History  . Marital Status: Married    Spouse Name: N/A    Number of Children: N/A  . Years of Education: N/A   Occupational History  . Not on file.   Social History Main Topics  . Smoking status: Never Smoker   . Smokeless tobacco: Not on file  . Alcohol Use: Yes     Comment: occ. wine  . Drug Use: No  . Sexual Activity: Not on file   Other Topics Concern  . Not on file   Social History Narrative        Medication List       This list is accurate as of: 12/24/14 11:59 PM.  Always use your most recent med list.               acyclovir 200 MG capsule  Commonly known as:  ZOVIRAX  Take 1 capsule (200 mg total) by mouth every 4 (four) hours while awake.     albuterol 108 (90 BASE) MCG/ACT inhaler  Commonly known as:  PROAIR HFA  INHALE 2 PUFFS INTO THE LUNGS EVERY 4 (FOUR) HOURS AS NEEDED     ALPRAZolam 0.5 MG tablet  Commonly known as:  XANAX  Take 1 tablet (0.5 mg total) by mouth as  directed. 1 po tid prn for panic attacks AND 2 po nightly prn insomnia     amoxicillin-clavulanate 875-125 MG per tablet  Commonly known as:  AUGMENTIN  Take 1 tablet by mouth 2 (two) times daily.     BIOTIN PO  Take 1 tablet by mouth daily.     escitalopram 10 MG tablet  Commonly known as:  LEXAPRO  Take 1 tablet (10 mg total) by mouth daily.     Fish Oil 1000 MG Caps  Take 1 capsule by mouth daily.     HYDROcodone-homatropine 5-1.5 MG/5ML syrup  Commonly known as:  HYCODAN  Take 5 mLs by mouth at bedtime as needed for cough.     multivitamin tablet  Take 1 tablet by mouth daily.     zolpidem 10 MG tablet  Commonly known as:  AMBIEN  Take 1 tablet (10 mg total) by mouth at bedtime as needed.           Objective:   Physical Exam  Constitutional: She is oriented to person, place, and time. She appears well-developed. No distress.  HENT:  Head: Normocephalic and atraumatic.  Right Ear: External ear normal.  Left Ear: External ear normal.  Throat symmetric, uvula midline A slightly congested. Maxillary sinuses TTP, frontal sinuses not TTP  Eyes: Conjunctivae and EOM are normal. Pupils are equal, round, and reactive to light. Right eye exhibits no discharge. Left eye exhibits no discharge.  Neck: Neck supple.  Cardiovascular:  RRR, no murmur, rub or gallop  Pulmonary/Chest: Effort normal. No respiratory distress.  CTA B  Musculoskeletal: She exhibits no edema or tenderness.  Lymphadenopathy:    Cervical adenopathy: no lymphadenopathies.  Neurological: She is alert and oriented to person, place, and time. No cranial nerve deficit. She exhibits normal muscle tone. Coordination normal.  Speech normal, gait unassisted and normal for age, motor strength appropriate for age   Skin: Skin is warm and dry. No pallor.  No jaundice  Psychiatric: She has a normal mood and affect. Her behavior is normal. Judgment and thought content normal.  Vitals reviewed.          Assessment & Plan:   Sinusitis One-month history of upper respiratory symptoms, persisting cough, facial pain. Went to urgent care last week, was prescribed prednisone and a Z-Pak. On exam, she is tender to palpation @ the maxillary sinuses. Persisting nocturnal cough could be asthma related. Not on BCP, LMP 2 w ago, h/o infertility Plan: Augmentin, cough control with hydrocodone, albuterol daily at bedtime, see instructions. If not better, consider a chest x-ray, tests for mono?    Problem List Items Addressed This Visit    None

## 2014-12-24 NOTE — Progress Notes (Signed)
Pre visit review using our clinic review tool, if applicable. No additional management support is needed unless otherwise documented below in the visit note. 

## 2015-01-07 ENCOUNTER — Telehealth: Payer: Self-pay | Admitting: Family Medicine

## 2015-01-07 MED ORDER — HYDROCODONE-ACETAMINOPHEN 5-325 MG PO TABS: 1.0000 | ORAL_TABLET | Freq: Three times a day (TID) | ORAL | Status: DC | PRN

## 2015-01-07 NOTE — Telephone Encounter (Signed)
Med filled and pt notified available for pick up at front desk.

## 2015-01-07 NOTE — Telephone Encounter (Signed)
Per his notes to the Pt, she was to take the syrup if the cough continued. So I believe he did.

## 2015-01-07 NOTE — Telephone Encounter (Signed)
Caller name: Thelma, Lorenzetti Relation to pt: self  Call back number: [phone]   Reason for call:  Pt is requesting HYDROcodone-homatropine (HYCODAN) 5-1.5 MG/5ML syrup for her endometriosis.

## 2015-01-07 NOTE — Telephone Encounter (Signed)
Did this pt receive this medcation on 12/24/14 at her appointment. It shows that this was printed that day by Dr. Larose Kells

## 2015-01-07 NOTE — Telephone Encounter (Signed)
[place] for Hydrocodone 5/325mg  TID prn, #30, no refills

## 2015-01-07 NOTE — Telephone Encounter (Signed)
So I spoke with pt and she wanted to know if due to bad period pains and endometriosis could she have a refill of her Hydrocodone, not the hycodan.

## 2015-01-10 ENCOUNTER — Telehealth: Payer: Self-pay | Admitting: Family Medicine

## 2015-01-10 MED ORDER — PROMETHAZINE-DM 6.25-15 MG/5ML PO SYRP: 5.0000 mL | ORAL_SOLUTION | Freq: Four times a day (QID) | ORAL | Status: DC | PRN

## 2015-01-10 NOTE — Telephone Encounter (Signed)
I filled the hydrocodone tablets pt had requested, Did not fill the cough medication

## 2015-01-10 NOTE — Telephone Encounter (Signed)
Pt requesting Hydrocodone syrup for persistent cough, Pt received Hydrocodone tablets from Dr. Birdie Riddle on 01/07/2015 for endometriosis. Please advise.

## 2015-01-10 NOTE — Telephone Encounter (Signed)
Caller name: Kylieann Relation to pt: self Call back number: [phone] Pharmacy: [facility] in [place]  Reason for call:   Patient states that she saw Dr. Larose Kells a couple of weeks ago and she is still coughing. She states that the cough did get better but is now back. She is requesting another refill of the cough medication

## 2015-01-10 NOTE — Telephone Encounter (Signed)
Promethazine filled pt notified.

## 2015-01-10 NOTE — Telephone Encounter (Signed)
Did you refill this on 01/07/2015, Pt requesting Hydrocodone cough syrup. Please advise.

## 2015-01-10 NOTE — Telephone Encounter (Signed)
Pt has hydrocodone pills.  Will not double dose w/ hydrocodone cough syrup.  Can do mucinex DM OTC or we can send promethazine cough syrup to pharmacy.

## 2015-01-10 NOTE — Telephone Encounter (Signed)
Routing error, please see below.

## 2015-01-17 ENCOUNTER — Encounter: Payer: 59 | Admitting: Family Medicine

## 2015-03-02 ENCOUNTER — Telehealth: Payer: Self-pay | Admitting: Family Medicine

## 2015-03-02 MED ORDER — ALPRAZOLAM 0.5 MG PO TABS: ORAL_TABLET | ORAL | Status: DC

## 2015-03-02 NOTE — Telephone Encounter (Signed)
Relation to pt: self  Call back number: [phone] Pharmacy: [facility] [place], [place] [phone] (Phone) [phone] (Fax)         Reason for call:   Pt requesting a refill ALPRAZolam (XANAX) 0.5 MG tablet. Pt states she is not completely out of medication but wanted to request because its going thru mail order. Pt requested a 90 day supply so she does not have to keep calling every month advised pt due to the type of medication 90 day supply is not possible.

## 2015-03-02 NOTE — Telephone Encounter (Signed)
Med filled and faxed to mail order.

## 2015-03-02 NOTE — Telephone Encounter (Signed)
Last OV 09-17-14 Alprazolam last filled 11-16-14 #120 with 0  Pt would like a 90 day prescription filled to mail order. Pt sig is 1 tablet TID PRN for panic attacks, and 2 tablets QHS PRN for insomnia  Please advise

## 2015-03-02 NOTE — Telephone Encounter (Signed)
[place] for #120 to mail order

## 2015-03-29 ENCOUNTER — Other Ambulatory Visit: Payer: Self-pay | Admitting: Family Medicine

## 2015-03-29 NOTE — Telephone Encounter (Signed)
Med filled.  

## 2015-04-20 LAB — HM PAP SMEAR: HM Pap smear: NORMAL

## 2015-04-29 ENCOUNTER — Telehealth: Payer: Self-pay | Admitting: Family Medicine

## 2015-04-29 NOTE — Telephone Encounter (Signed)
Pre Visit letter sent  °

## 2015-05-02 ENCOUNTER — Telehealth: Payer: Self-pay | Admitting: General Practice

## 2015-05-02 ENCOUNTER — Other Ambulatory Visit: Payer: Self-pay | Admitting: Family Medicine

## 2015-05-02 MED ORDER — ALPRAZOLAM 0.5 MG PO TABS: ORAL_TABLET | ORAL | Status: DC

## 2015-05-02 NOTE — Telephone Encounter (Addendum)
Pt states that she mostly takes two tablets by mouth at night. And doesn't usually take them by mouth during the day. Med filled for #120 by provider and notified that we will discuss the use at night at her upcoming appt due to this not being the best medication.

## 2015-05-02 NOTE — Telephone Encounter (Signed)
Caller name:Ugochi Sevillano Relationship to patient:selg Can be reached:[phone] Pharmacy:  Reason for call:Returning call/request call after 4:30pm

## 2015-05-02 NOTE — Addendum Note (Signed)
Addended by: Kris Hartmann on: 05/02/2015 04:46 PM   Modules accepted: Orders

## 2015-05-02 NOTE — Telephone Encounter (Signed)
Ok for refill but please ask pt how often she is using medication since she went through 120 pills in 2 months

## 2015-05-02 NOTE — Telephone Encounter (Signed)
Last OV 09-17-14 Alprazolam last filled 03-02-15 #120 with 0

## 2015-05-02 NOTE — Telephone Encounter (Signed)
Called pt and lmovm to return call.

## 2015-05-02 NOTE — Telephone Encounter (Signed)
Will call pt after 4:30 as requested.

## 2015-05-02 NOTE — Telephone Encounter (Signed)
Spoke with pt states that she has been on the same sig forever. Pt states that she mostly takes them at night to help.

## 2015-05-18 ENCOUNTER — Encounter: Payer: Self-pay | Admitting: *Deleted

## 2015-05-18 ENCOUNTER — Telehealth: Payer: Self-pay | Admitting: *Deleted

## 2015-05-18 NOTE — Telephone Encounter (Signed)
Pre-Visit Call completed with patient and chart updated.   Pre-Visit Info documented in Specialty Comments under [employer].    

## 2015-05-18 NOTE — Telephone Encounter (Signed)
Unable to reach patient at time of Pre-Visit Call.  Left message for patient to return call when available.    

## 2015-05-18 NOTE — Addendum Note (Signed)
Addended by: Leticia Penna A on: 05/18/2015 05:03 PM   Modules accepted: Orders, Medications

## 2015-05-18 NOTE — Telephone Encounter (Signed)
Patient returned phone call. Best # [phone]

## 2015-05-20 ENCOUNTER — Encounter: Payer: Self-pay | Admitting: General Practice

## 2015-05-20 ENCOUNTER — Ambulatory Visit (INDEPENDENT_AMBULATORY_CARE_PROVIDER_SITE_OTHER): Payer: 59 | Admitting: Family Medicine

## 2015-05-20 ENCOUNTER — Encounter: Payer: Self-pay | Admitting: Family Medicine

## 2015-05-20 VITALS — BP 122/80 | HR 74 | Temp 98.1°F | Resp 17 | Ht 65.5 in | Wt 161.4 lb

## 2015-05-20 DIAGNOSIS — Z0000 Encounter for general adult medical examination without abnormal findings: Secondary | ICD-10-CM

## 2015-05-20 DIAGNOSIS — G4700 Insomnia, unspecified: Secondary | ICD-10-CM

## 2015-05-20 LAB — CBC WITH DIFFERENTIAL/PLATELET
Basophils Absolute: 0.1 10*3/uL (ref 0.0–0.1)
Basophils Relative: 0.6 % (ref 0.0–3.0)
Eosinophils Absolute: 0.6 10*3/uL (ref 0.0–0.7)
Eosinophils Relative: 5.8 % — ABNORMAL HIGH (ref 0.0–5.0)
HCT: 39.7 % (ref 36.0–46.0)
Hemoglobin: 13.2 g/dL (ref 12.0–15.0)
Lymphocytes Relative: 11.7 % — ABNORMAL LOW (ref 12.0–46.0)
Lymphs Abs: 1.1 10*3/uL (ref 0.7–4.0)
MCHC: 33.4 g/dL (ref 30.0–36.0)
MCV: 97.4 fl (ref 78.0–100.0)
Monocytes Absolute: 0.7 10*3/uL (ref 0.1–1.0)
Monocytes Relative: 7.2 % (ref 3.0–12.0)
Neutro Abs: 7.3 10*3/uL (ref 1.4–7.7)
Neutrophils Relative %: 74.7 % (ref 43.0–77.0)
Platelets: 306 10*3/uL (ref 150.0–400.0)
RBC: 4.07 Mil/uL (ref 3.87–5.11)
RDW: 14.2 % (ref 11.5–15.5)
WBC: 9.8 10*3/uL (ref 4.0–10.5)

## 2015-05-20 LAB — LIPID PANEL
Cholesterol: 178 mg/dL (ref 0–200)
HDL: 82 mg/dL (ref >39.00)
LDL Cholesterol: 76 mg/dL (ref 0–99)
NonHDL: 96
Total CHOL/HDL Ratio: 2
Triglycerides: 98 mg/dL (ref 0.0–149.0)
VLDL: 19.6 mg/dL (ref 0.0–40.0)

## 2015-05-20 LAB — BASIC METABOLIC PANEL
BUN: 15 mg/dL (ref 6–23)
CO2: 27 mEq/L (ref 19–32)
Calcium: 8.8 mg/dL (ref 8.4–10.5)
Chloride: 103 mEq/L (ref 96–112)
Creatinine, Ser: 0.58 mg/dL (ref 0.40–1.20)
GFR: 119.67 mL/min (ref >60.00)
Glucose, Bld: 84 mg/dL (ref 70–99)
Potassium: 3.7 mEq/L (ref 3.5–5.1)
Sodium: 136 mEq/L (ref 135–145)

## 2015-05-20 LAB — VITAMIN D 25 HYDROXY (VIT D DEFICIENCY, FRACTURES): VITD: 28.17 ng/mL — ABNORMAL LOW (ref 30.00–100.00)

## 2015-05-20 LAB — HEPATIC FUNCTION PANEL
ALT: 12 U/L (ref 0–35)
AST: 17 U/L (ref 0–37)
Albumin: 4.1 g/dL (ref 3.5–5.2)
Alkaline Phosphatase: 38 U/L — ABNORMAL LOW (ref 39–117)
Bilirubin, Direct: 0.1 mg/dL (ref 0.0–0.3)
Total Bilirubin: 0.5 mg/dL (ref 0.2–1.2)
Total Protein: 6.8 g/dL (ref 6.0–8.3)

## 2015-05-20 LAB — TSH: TSH: 1.81 u[IU]/mL (ref 0.35–4.50)

## 2015-05-20 MED ORDER — SUVOREXANT 10 MG PO TABS: 10.0000 mg | ORAL_TABLET | Freq: Every day | ORAL | Status: DC

## 2015-05-20 NOTE — Patient Instructions (Signed)
Follow up in 1 year or as needed We'll notify you of your lab results and make any changes if needed Start the Belsomra nightly.  If after 3-4 nights, it is not effective, take 2 and let me know so I can change your prescription The 1 coupon is good for 10 free pills, the card is good for discounted monthly prescriptions Keep up the good work on healthy diet and regular exercise Call with any questions or concerns Happy 4th of July!!!

## 2015-05-20 NOTE — Assessment & Plan Note (Signed)
Pt's PE WNL.  UTD on GYN.  Check labs.  Anticipatory guidance provided.  

## 2015-05-20 NOTE — Progress Notes (Signed)
   Subjective:    Patient ID: Danielle Oneill, female    DOB: 09-11-1971, 44 y.o.   MRN: [id number]  HPI CPE- UTD on GYN ([place]).  Exercising 4-5 days/week.  + insomnia- pt reports she will wake multiple times/night which is very stressful to her.  Scared to take Ambien b/c she enjoys wine nightly.  Able to fall asleep w/o trouble.  Has good sleep hygiene- no electronics, bed time routine.   Review of Systems Patient reports no vision/ hearing changes, adenopathy,fever, weight change,  persistant/recurrent hoarseness , swallowing issues, chest pain, palpitations, edema, persistant/recurrent cough, hemoptysis, dyspnea (rest/exertional/paroxysmal nocturnal), gastrointestinal bleeding (melena, rectal bleeding), abdominal pain, significant heartburn, bowel changes, GU symptoms (dysuria, hematuria, incontinence), Gyn symptoms (abnormal  bleeding, pain),  syncope, focal weakness, memory loss, numbness & tingling, skin/hair/nail changes, abnormal bruising or bleeding, anxiety, or depression.     Objective:   Physical Exam General Appearance:    Alert, cooperative, no distress, appears stated age  Head:    Normocephalic, without obvious abnormality, atraumatic  Eyes:    PERRL, conjunctiva/corneas clear, EOM's intact, fundi    benign, both eyes  Ears:    Normal TM's and external ear canals, both ears  Nose:   Nares normal, septum midline, mucosa normal, no drainage    or sinus tenderness  Throat:   Lips, mucosa, and tongue normal; teeth and gums normal  Neck:   Supple, symmetrical, trachea midline, no adenopathy;    Thyroid: no enlargement/tenderness/nodules  Back:     Symmetric, no curvature, ROM normal, no CVA tenderness  Lungs:     Clear to auscultation bilaterally, respirations unlabored  Chest Wall:    No tenderness or deformity   Heart:    Regular rate and rhythm, S1 and S2 normal, no murmur, rub   or gallop  Breast Exam:    Deferred to GYN  Abdomen:     Soft, non-tender, bowel sounds  active all four quadrants,    no masses, no organomegaly  Genitalia:    Deferred to GYN  Rectal:    Extremities:   Extremities normal, atraumatic, no cyanosis or edema  Pulses:   2+ and symmetric all extremities  Skin:   Skin color, texture, turgor normal, no rashes or lesions  Lymph nodes:   Cervical, supraclavicular, and axillary nodes normal  Neurologic:   CNII-XII intact, normal strength, sensation and reflexes    throughout          Assessment & Plan:

## 2015-05-20 NOTE — Assessment & Plan Note (Signed)
Deteriorated.  Not taking Ambien due to drinking wine nightly.  Will start Belsomra and monitor for improvement- reviewed appropriate use and possible side effects.  Coupon cards given.  Continue sleep hygiene.  Will follow.

## 2015-05-20 NOTE — Progress Notes (Signed)
Pre visit review using our clinic review tool, if applicable. No additional management support is needed unless otherwise documented below in the visit note. 

## 2015-05-24 ENCOUNTER — Telehealth: Payer: Self-pay | Admitting: Family Medicine

## 2015-05-24 NOTE — Telephone Encounter (Signed)
Called patient at [phone] and left message for return call.

## 2015-05-24 NOTE — Telephone Encounter (Signed)
Pt returning your call please after 5pm or tomorrow 05/25/15 at noon preferable

## 2015-05-24 NOTE — Telephone Encounter (Signed)
Did she really use 120 pills in 20 days?

## 2015-05-24 NOTE — Telephone Encounter (Signed)
Caller name: Nicollette Relationship to patient: self Can be [phone] [facility]  Reason for call: she was just in for her physical  She would like refills for alprazolam, pro air and lexapro  [facility] so that when she is due for a refill they will have them  Dr Birdie Riddle mentioned belsomra as a sleeping med.  It is a $300 rx when it is approved.  Probably will not be able to get the Belsomra

## 2015-05-24 NOTE — Telephone Encounter (Signed)
Can you find out 1) did pt activate savings cards for Belsomra? 2.) how much of the alprazolam has she used in the past 20 days?

## 2015-05-24 NOTE — Telephone Encounter (Signed)
Last OV 05-20-15 Alprazolam last filled 05-02-15 #120 with 0

## 2015-05-25 ENCOUNTER — Telehealth: Payer: Self-pay | Admitting: *Deleted

## 2015-05-25 MED ORDER — ALBUTEROL SULFATE HFA 108 (90 BASE) MCG/ACT IN AERS: INHALATION_SPRAY | RESPIRATORY_TRACT | Status: DC

## 2015-05-25 MED ORDER — ALPRAZOLAM 0.5 MG PO TABS: ORAL_TABLET | ORAL | Status: DC

## 2015-05-25 MED ORDER — ESCITALOPRAM OXALATE 10 MG PO TABS: 10.0000 mg | ORAL_TABLET | Freq: Every day | ORAL | Status: DC

## 2015-05-25 NOTE — Telephone Encounter (Signed)
error 

## 2015-05-25 NOTE — Telephone Encounter (Signed)
Med filled.  

## 2015-05-25 NOTE — Addendum Note (Signed)
Addended by: Kris Hartmann on: 05/25/2015 01:15 PM   Modules accepted: Orders

## 2015-05-25 NOTE — Telephone Encounter (Signed)
Patient states that she activated the coupon for Belsomra and it was $180 still and she is waiting to see if it is approved from her insurance, but it still expensive so she would like to see if there is another option.   Re: Alprazolam- she states that she gets this filled monthly so whenever she calls to get a refill it states that she has none left and has to contact MD.  She was trying to get it sent to the company so that when she has a refill due, it would already be cued up.  She states she takes 2 at night and as needed throughout the day- sometimes none, sometimes as many as 3/day.  She has some left currently and does not need them refilled, just wanted to make sure they were available so she did not have a gap.

## 2015-05-25 NOTE — Telephone Encounter (Signed)
[facility]

## 2015-05-25 NOTE — Telephone Encounter (Signed)
Ok for refills on Lexapro #90 w/ 3, Proair #3 w/ 3, and Alprazolam #120 1 refill

## 2015-05-26 ENCOUNTER — Telehealth: Payer: Self-pay | Admitting: Family Medicine

## 2015-05-26 NOTE — Telephone Encounter (Signed)
Relation to pt: self  Call back number: [phone]   Reason for call:  As per patient received lab results in the mail in need of clarification. Please advise

## 2015-05-27 NOTE — Telephone Encounter (Signed)
Pt notified that per labs only needs to start an OTV vitamin d. Pt stated an understanding.

## 2015-05-30 ENCOUNTER — Telehealth: Payer: Self-pay | Admitting: *Deleted

## 2015-05-30 NOTE — Telephone Encounter (Signed)
Prior authorization for Belsomra initiated. Awaiting determination. JG//CMA

## 2015-06-06 NOTE — Telephone Encounter (Signed)
PA approved effective 04/30/2015 through 05/29/2016. Case ID: [id number]. JG//CMA

## 2015-07-26 ENCOUNTER — Other Ambulatory Visit: Payer: Self-pay | Admitting: Family Medicine

## 2015-07-27 NOTE — Telephone Encounter (Signed)
Medication filled to pharmacy as requested.   

## 2015-08-27 ENCOUNTER — Other Ambulatory Visit: Payer: Self-pay | Admitting: Family Medicine

## 2015-08-29 NOTE — Telephone Encounter (Signed)
Medication Detail      Disp Refills Start End     acyclovir (ZOVIRAX) 200 MG capsule 120 capsule 0 07/27/2015     Sig: TAKE 1 CAPSULE EVERY 4 HOURS WHILE AWAKE    E-Prescribing Status: Receipt confirmed by pharmacy (07/27/2015 8:56 AM EDT)     Pharmacy    [facility]   Refill sent per [facility] refill protocol/SLS

## 2015-08-31 ENCOUNTER — Other Ambulatory Visit: Payer: Self-pay | Admitting: General Practice

## 2015-08-31 MED ORDER — SUVOREXANT 10 MG PO TABS: 10.0000 mg | ORAL_TABLET | Freq: Every day | ORAL | Status: DC

## 2015-08-31 NOTE — Telephone Encounter (Signed)
Last OV 05/20/15 belsomra was filled 05-20-15 #30 with 3  [facility] to [facility] medication. Please advise if ok to write for #90?

## 2015-08-31 NOTE — Telephone Encounter (Signed)
Medication filled to pharmacy as requested.   

## 2015-09-01 ENCOUNTER — Other Ambulatory Visit: Payer: Self-pay | Admitting: General Practice

## 2015-09-01 MED ORDER — ALPRAZOLAM 0.5 MG PO TABS: ORAL_TABLET | ORAL | Status: DC

## 2015-09-01 NOTE — Telephone Encounter (Signed)
Medication filled to pharmacy as requested.   

## 2015-09-01 NOTE — Telephone Encounter (Signed)
Last OV 05-20-15 Alprazolam last filled 05-25-15 #120 with 1

## 2015-09-08 ENCOUNTER — Ambulatory Visit: Payer: 59

## 2015-10-14 ENCOUNTER — Other Ambulatory Visit: Payer: Self-pay | Admitting: Family Medicine

## 2015-10-17 NOTE — Telephone Encounter (Signed)
Medication filled to pharmacy as requested.   

## 2015-11-29 LAB — HM MAMMOGRAPHY: HM Mammogram: NORMAL (ref 0–4)

## 2015-12-01 ENCOUNTER — Telehealth: Payer: Self-pay | Admitting: Family Medicine

## 2015-12-01 MED ORDER — ALPRAZOLAM 0.5 MG PO TABS: ORAL_TABLET | ORAL | Status: DC

## 2015-12-01 NOTE — Telephone Encounter (Signed)
Rx faxed to pharmacy  

## 2015-12-01 NOTE — Telephone Encounter (Signed)
Relation to [id number] Call back [phone] Pharmacy:  Reason for call:  Patient requesting a refill ALPRAZolam (XANAX) 0.5 MG tablet

## 2015-12-01 NOTE — Telephone Encounter (Signed)
[place] for #120, no refills.  Pt should only use sparingly during the day for anxiety

## 2015-12-01 NOTE — Telephone Encounter (Signed)
Dr. Darene Lamer, please advise.   Last OV: 05/20/2015 Last filled: 09/01/2015, #120, 1 RF UDS: 09/04/2013, moderate risk

## 2016-01-23 ENCOUNTER — Other Ambulatory Visit: Payer: Self-pay | Admitting: Obstetrics & Gynecology

## 2016-01-25 ENCOUNTER — Telehealth: Payer: Self-pay | Admitting: Family Medicine

## 2016-01-25 MED ORDER — ALPRAZOLAM 0.5 MG PO TABS: ORAL_TABLET | ORAL | Status: DC

## 2016-01-25 NOTE — Telephone Encounter (Signed)
This is a controlled substance and I am not willing to prescribe more than #120 w/ 1 refill.

## 2016-01-25 NOTE — Telephone Encounter (Signed)
Pt called in to request a refill on her Rx ALPRAZolam.  She says going forward she would like to get a 90 day supply if possible to avoid her calling in so often.   CBXU:[id number].   Pharmacy: [place], [place]

## 2016-01-25 NOTE — Telephone Encounter (Signed)
Last OV 05/20/15 Alprazolam last filled 12/01/15 #120 with 1  Please advise as the #120 would not be a 90 day supply.

## 2016-01-25 NOTE — Telephone Encounter (Signed)
Medication filled to pharmacy as requested.  Pt notified that we cannot fill for 90 days

## 2016-01-27 ENCOUNTER — Other Ambulatory Visit (HOSPITAL_COMMUNITY): Payer: 59

## 2016-02-02 NOTE — Patient Instructions (Signed)
Your procedure is scheduled on:  Friday. February 10, 2016  Enter through the [employer] of [facility] at:  7:00 AM  Pick up the phone at the desk and dial [phone].  Call this number if you have problems the morning of surgery: [phone].  Remember:  Do NOT eat food or drink after:  Midnight Thursday  Take these medicines the morning of surgery with a SIP OF WATER:  Lexapro  Bring Asthma Inhaler day of surgery.  Stop taking fish oil at this time.  Do NOT wear jewelry (body piercing), metal hair clips/bobby pins, make-up, or nail polish. Do NOT wear lotions, powders, or perfumes.  You may wear deoderant. Do NOT shave for 48 hours prior to surgery. Do NOT bring valuables to the hospital. Contacts, dentures, or bridgework may not be worn into surgery. Leave suitcase in car.  After surgery it may be brought to your room.  For patients admitted to the hospital, checkout time is 11:00 AM the day of discharge. Have a responsible adult drive you home and stay with you for 24 hours after your procedure

## 2016-02-03 ENCOUNTER — Encounter (HOSPITAL_COMMUNITY)
Admission: RE | Admit: 2016-02-03 | Discharge: 2016-02-03 | Disposition: A | Discharge status: Home / Self Care | Payer: 59 | Source: Ambulatory Visit | Attending: Obstetrics & Gynecology | Admitting: Obstetrics & Gynecology

## 2016-02-03 ENCOUNTER — Encounter (HOSPITAL_COMMUNITY): Payer: Self-pay

## 2016-02-03 DIAGNOSIS — Z01812 Encounter for preprocedural laboratory examination: Secondary | ICD-10-CM | POA: Insufficient documentation

## 2016-02-03 LAB — CBC
HCT: 40.1 % (ref 36.0–46.0)
Hemoglobin: 13.3 g/dL (ref 12.0–15.0)
MCH: 32.8 pg (ref 26.0–34.0)
MCHC: 33.2 g/dL (ref 30.0–36.0)
MCV: 98.8 fL (ref 78.0–100.0)
Platelets: 326 10*3/uL (ref 150–400)
RBC: 4.06 MIL/uL (ref 3.87–5.11)
RDW: 14.3 % (ref 11.5–15.5)
WBC: 7.2 10*3/uL (ref 4.0–10.5)

## 2016-02-09 LAB — TYPE AND SCREEN
ABO/RH(D): A NEG
Antibody Screen: NEGATIVE

## 2016-02-10 ENCOUNTER — Encounter (HOSPITAL_COMMUNITY): Payer: Self-pay | Admitting: Emergency Medicine

## 2016-02-10 ENCOUNTER — Ambulatory Visit (HOSPITAL_COMMUNITY): Payer: 59 | Admitting: Anesthesiology

## 2016-02-10 ENCOUNTER — Encounter (HOSPITAL_COMMUNITY)
Admission: RE | Disposition: A | Discharge status: Home / Self Care | Payer: Self-pay | Source: Ambulatory Visit | Attending: Obstetrics & Gynecology

## 2016-02-10 ENCOUNTER — Ambulatory Visit (HOSPITAL_COMMUNITY)
Admission: RE | Admit: 2016-02-10 | Discharge: 2016-02-10 | Disposition: A | Discharge status: Home / Self Care | Payer: 59 | Source: Ambulatory Visit | Attending: Obstetrics & Gynecology | Admitting: Obstetrics & Gynecology

## 2016-02-10 DIAGNOSIS — N83201 Unspecified ovarian cyst, right side: Secondary | ICD-10-CM | POA: Diagnosis present

## 2016-02-10 DIAGNOSIS — N801 Endometriosis of ovary: Secondary | ICD-10-CM | POA: Insufficient documentation

## 2016-02-10 DIAGNOSIS — N803 Endometriosis of pelvic peritoneum: Secondary | ICD-10-CM | POA: Insufficient documentation

## 2016-02-10 DIAGNOSIS — N802 Endometriosis of fallopian tube: Secondary | ICD-10-CM | POA: Insufficient documentation

## 2016-02-10 DIAGNOSIS — R102 Pelvic and perineal pain: Secondary | ICD-10-CM | POA: Diagnosis not present

## 2016-02-10 DIAGNOSIS — J45909 Unspecified asthma, uncomplicated: Secondary | ICD-10-CM | POA: Insufficient documentation

## 2016-02-10 DIAGNOSIS — F418 Other specified anxiety disorders: Secondary | ICD-10-CM | POA: Insufficient documentation

## 2016-02-10 HISTORY — PX: ROBOTIC ASSISTED SALPINGO OOPHERECTOMY: SHX6082

## 2016-02-10 LAB — PREGNANCY, URINE: Preg Test, Ur: NEGATIVE

## 2016-02-10 SURGERY — ROBOTIC ASSISTED SALPINGO OOPHORECTOMY
Anesthesia: General | Site: Abdomen | Laterality: Right

## 2016-02-10 MED ORDER — MIDAZOLAM HCL 2 MG/2ML IJ SOLN
INTRAMUSCULAR | Status: AC
  Filled 2016-02-10: qty 2

## 2016-02-10 MED ORDER — BUPIVACAINE HCL (PF) 0.25 % IJ SOLN
INTRAMUSCULAR | Status: DC | PRN
  Administered 2016-02-10: 17 mL

## 2016-02-10 MED ORDER — SCOPOLAMINE 1 MG/3DAYS TD PT72
MEDICATED_PATCH | TRANSDERMAL | Status: AC
  Administered 2016-02-10: 1.5 mg via TRANSDERMAL
  Filled 2016-02-10: qty 1

## 2016-02-10 MED ORDER — ROPIVACAINE HCL 5 MG/ML IJ SOLN
INTRAMUSCULAR | Status: AC
  Filled 2016-02-10: qty 30

## 2016-02-10 MED ORDER — HYDROMORPHONE HCL 1 MG/ML IJ SOLN
0.2500 mg | INTRAMUSCULAR | Status: DC | PRN
  Administered 2016-02-10 (×3): 0.5 mg via INTRAVENOUS

## 2016-02-10 MED ORDER — OXYCODONE HCL 5 MG PO TABS
ORAL_TABLET | ORAL | Status: AC
  Filled 2016-02-10: qty 1

## 2016-02-10 MED ORDER — SODIUM CHLORIDE 0.9 % IJ SOLN
INTRAMUSCULAR | Status: AC
  Filled 2016-02-10: qty 50

## 2016-02-10 MED ORDER — LIDOCAINE HCL (CARDIAC) 20 MG/ML IV SOLN
INTRAVENOUS | Status: AC
  Filled 2016-02-10: qty 5

## 2016-02-10 MED ORDER — HYDROMORPHONE HCL 1 MG/ML IJ SOLN
INTRAMUSCULAR | Status: AC
  Filled 2016-02-10: qty 1

## 2016-02-10 MED ORDER — OXYCODONE HCL 5 MG/5ML PO SOLN: 5.0000 mg | Freq: Once | ORAL | Status: AC | PRN

## 2016-02-10 MED ORDER — GLYCOPYRROLATE 0.2 MG/ML IJ SOLN
INTRAMUSCULAR | Status: DC | PRN
  Administered 2016-02-10: .8 mg via INTRAVENOUS

## 2016-02-10 MED ORDER — SUCCINYLCHOLINE CHLORIDE 20 MG/ML IJ SOLN: INTRAMUSCULAR | Status: DC | PRN

## 2016-02-10 MED ORDER — ARTIFICIAL TEARS OP OINT
TOPICAL_OINTMENT | OPHTHALMIC | Status: AC
  Filled 2016-02-10: qty 3.5

## 2016-02-10 MED ORDER — VASOPRESSIN 20 UNIT/ML IV SOLN
INTRAVENOUS | Status: AC
  Filled 2016-02-10: qty 1

## 2016-02-10 MED ORDER — FENTANYL CITRATE (PF) 250 MCG/5ML IJ SOLN
INTRAMUSCULAR | Status: AC
  Filled 2016-02-10: qty 5

## 2016-02-10 MED ORDER — ACETAMINOPHEN 160 MG/5ML PO SOLN: 325.0000 mg | ORAL | Status: DC | PRN

## 2016-02-10 MED ORDER — MIDAZOLAM HCL 2 MG/2ML IJ SOLN
INTRAMUSCULAR | Status: DC | PRN
  Administered 2016-02-10: 2 mg via INTRAVENOUS

## 2016-02-10 MED ORDER — OXYCODONE-ACETAMINOPHEN 7.5-325 MG PO TABS: 1.0000 | ORAL_TABLET | ORAL | Status: DC | PRN

## 2016-02-10 MED ORDER — OXYCODONE HCL 5 MG PO TABS
5.0000 mg | ORAL_TABLET | Freq: Once | ORAL | Status: AC | PRN
  Administered 2016-02-10: 5 mg via ORAL

## 2016-02-10 MED ORDER — SODIUM CHLORIDE 0.9 % IJ SOLN
INTRAMUSCULAR | Status: AC
  Filled 2016-02-10: qty 100

## 2016-02-10 MED ORDER — LIDOCAINE HCL (CARDIAC) 20 MG/ML IV SOLN
INTRAVENOUS | Status: DC | PRN
  Administered 2016-02-10: 30 mg via INTRAVENOUS

## 2016-02-10 MED ORDER — LACTATED RINGERS IR SOLN
Status: DC | PRN
  Administered 2016-02-10: 3000 mL

## 2016-02-10 MED ORDER — DEXAMETHASONE SODIUM PHOSPHATE 10 MG/ML IJ SOLN
INTRAMUSCULAR | Status: DC | PRN
  Administered 2016-02-10: 4 mg via INTRAVENOUS

## 2016-02-10 MED ORDER — HYDROMORPHONE HCL 1 MG/ML IJ SOLN
INTRAMUSCULAR | Status: DC | PRN
  Administered 2016-02-10 (×2): 0.5 mg via INTRAVENOUS

## 2016-02-10 MED ORDER — HYDROMORPHONE HCL 1 MG/ML IJ SOLN
INTRAMUSCULAR | Status: AC
Start: 2016-02-10 — End: 2016-02-10
  Administered 2016-02-10: 0.5 mg via INTRAVENOUS
  Filled 2016-02-10: qty 1

## 2016-02-10 MED ORDER — PROPOFOL 10 MG/ML IV BOLUS
INTRAVENOUS | Status: DC | PRN
  Administered 2016-02-10: 30 mg via INTRAVENOUS
  Administered 2016-02-10: 150 mg via INTRAVENOUS

## 2016-02-10 MED ORDER — CEFAZOLIN SODIUM-DEXTROSE 2-3 GM-% IV SOLR
INTRAVENOUS | Status: AC
  Filled 2016-02-10: qty 50

## 2016-02-10 MED ORDER — FENTANYL CITRATE (PF) 100 MCG/2ML IJ SOLN
INTRAMUSCULAR | Status: AC
  Filled 2016-02-10: qty 2

## 2016-02-10 MED ORDER — LACTATED RINGERS IV SOLN
INTRAVENOUS | Status: DC
  Administered 2016-02-10 (×2): via INTRAVENOUS

## 2016-02-10 MED ORDER — ACETAMINOPHEN 325 MG PO TABS: 325.0000 mg | ORAL_TABLET | ORAL | Status: DC | PRN

## 2016-02-10 MED ORDER — GLYCOPYRROLATE 0.2 MG/ML IJ SOLN
INTRAMUSCULAR | Status: AC
  Filled 2016-02-10: qty 4

## 2016-02-10 MED ORDER — DEXAMETHASONE SODIUM PHOSPHATE 4 MG/ML IJ SOLN
INTRAMUSCULAR | Status: AC
  Filled 2016-02-10: qty 1

## 2016-02-10 MED ORDER — FENTANYL CITRATE (PF) 100 MCG/2ML IJ SOLN
INTRAMUSCULAR | Status: DC | PRN
  Administered 2016-02-10 (×2): 50 ug via INTRAVENOUS
  Administered 2016-02-10: 25 ug via INTRAVENOUS
  Administered 2016-02-10 (×4): 50 ug via INTRAVENOUS
  Administered 2016-02-10: 25 ug via INTRAVENOUS

## 2016-02-10 MED ORDER — DEXTROSE 5 % IV SOLN
2.0000 g | INTRAVENOUS | Status: AC
  Administered 2016-02-10: 2 g via INTRAVENOUS
  Filled 2016-02-10: qty 20

## 2016-02-10 MED ORDER — VASOPRESSIN 20 UNIT/ML IV SOLN
INTRAVENOUS | Status: DC | PRN
  Administered 2016-02-10: 20 mL via INTRAMUSCULAR

## 2016-02-10 MED ORDER — ONDANSETRON HCL 4 MG/2ML IJ SOLN
INTRAMUSCULAR | Status: DC | PRN
  Administered 2016-02-10: 4 mg via INTRAVENOUS

## 2016-02-10 MED ORDER — NEOSTIGMINE METHYLSULFATE 10 MG/10ML IV SOLN
INTRAVENOUS | Status: DC | PRN
  Administered 2016-02-10: 5 mg via INTRAVENOUS

## 2016-02-10 MED ORDER — SCOPOLAMINE 1 MG/3DAYS TD PT72
1.0000 | MEDICATED_PATCH | Freq: Once | TRANSDERMAL | Status: DC
  Administered 2016-02-10: 1.5 mg via TRANSDERMAL

## 2016-02-10 MED ORDER — NEOSTIGMINE METHYLSULFATE 10 MG/10ML IV SOLN
INTRAVENOUS | Status: AC
  Filled 2016-02-10: qty 1

## 2016-02-10 MED ORDER — ROCURONIUM BROMIDE 100 MG/10ML IV SOLN
INTRAVENOUS | Status: DC | PRN
  Administered 2016-02-10: 10 mg via INTRAVENOUS
  Administered 2016-02-10: 30 mg via INTRAVENOUS
  Administered 2016-02-10: 5 mg via INTRAVENOUS
  Administered 2016-02-10: 20 mg via INTRAVENOUS
  Administered 2016-02-10 (×2): 5 mg via INTRAVENOUS
  Administered 2016-02-10: 10 mg via INTRAVENOUS

## 2016-02-10 MED ORDER — BUPIVACAINE HCL (PF) 0.25 % IJ SOLN
INTRAMUSCULAR | Status: AC
  Filled 2016-02-10: qty 30

## 2016-02-10 MED ORDER — ONDANSETRON HCL 4 MG/2ML IJ SOLN
INTRAMUSCULAR | Status: AC
  Filled 2016-02-10: qty 2

## 2016-02-10 MED ORDER — KETOROLAC TROMETHAMINE 30 MG/ML IJ SOLN
INTRAMUSCULAR | Status: DC | PRN
  Administered 2016-02-10: 30 mg via INTRAVENOUS

## 2016-02-10 MED ORDER — PROPOFOL 10 MG/ML IV BOLUS
INTRAVENOUS | Status: AC
  Filled 2016-02-10: qty 20

## 2016-02-10 SURGICAL SUPPLY — 50 items
BARRIER ADHS 3X4 INTERCEED (GAUZE/BANDAGES/DRESSINGS) ×2 IMPLANT
CELL SAVER LIPIGURD (MISCELLANEOUS) ×1 IMPLANT
CHLORAPREP W/TINT 26ML (MISCELLANEOUS) ×2 IMPLANT
CLOTH BEACON ORANGE TIMEOUT ST (SAFETY) ×2 IMPLANT
CONT PATH 16OZ SNAP LID 3702 (MISCELLANEOUS) ×2 IMPLANT
COVER BACK TABLE 60X90IN (DRAPES) ×4 IMPLANT
COVER TIP SHEARS 8 DVNC (MISCELLANEOUS) ×1 IMPLANT
COVER TIP SHEARS 8MM DA VINCI (MISCELLANEOUS) ×1
DECANTER SPIKE VIAL GLASS SM (MISCELLANEOUS) ×2 IMPLANT
DEFOGGER SCOPE WARMER CLEARIFY (MISCELLANEOUS) ×2 IMPLANT
ELECT REM PT RETURN 9FT ADLT (ELECTROSURGICAL) ×2
ELECTRODE REM PT RTRN 9FT ADLT (ELECTROSURGICAL) ×1 IMPLANT
EXTRT SYSTEM ALEXIS 14CM (MISCELLANEOUS) ×2
GAUZE VASELINE 3X9 (GAUZE/BANDAGES/DRESSINGS) IMPLANT
GLOVE BIO SURGEON STRL SZ 6.5 (GLOVE) ×2 IMPLANT
GLOVE BIOGEL PI IND STRL 7.0 (GLOVE) ×2 IMPLANT
GLOVE BIOGEL PI INDICATOR 7.0 (GLOVE) ×2
KIT ACCESSORY DA VINCI DISP (KITS) ×1
KIT ACCESSORY DVNC DISP (KITS) ×1 IMPLANT
LIQUID BAND (GAUZE/BANDAGES/DRESSINGS) ×4 IMPLANT
NEEDLE SPNL 22GX7 QUINCKE BK (NEEDLE) ×2 IMPLANT
PACK ROBOT WH (CUSTOM PROCEDURE TRAY) ×2 IMPLANT
PACK ROBOTIC GOWN (GOWN DISPOSABLE) ×2 IMPLANT
PAD PREP 24X48 CUFFED NSTRL (MISCELLANEOUS) ×4 IMPLANT
PAD TRENDELENBURG POSITION (MISCELLANEOUS) ×2 IMPLANT
RETRACTOR ALEXIS WOUND XXSMALL (INSTRUMENTS) ×2 IMPLANT
RETRACTOR WOUND ALXS 19CM XSML (INSTRUMENTS) ×1 IMPLANT
RTRCTR WOUND ALEXIS 19CM XSML (INSTRUMENTS) ×2
SET IRRIG TUBING LAPAROSCOPIC (IRRIGATION / IRRIGATOR) ×2 IMPLANT
SET TRI-LUMEN FLTR TB AIRSEAL (TUBING) ×2 IMPLANT
SUT VIC AB 3-0 SH 27 (SUTURE)
SUT VIC AB 3-0 SH 27X BRD (SUTURE) IMPLANT
SUT VIC AB 4-0 PS2 27 (SUTURE) ×4 IMPLANT
SUT VICRYL 0 UR6 27IN ABS (SUTURE) ×2 IMPLANT
SUT VLOC 180 0 9IN  GS21 (SUTURE) ×1
SUT VLOC 180 0 9IN GS21 (SUTURE) ×1 IMPLANT
SYSTEM CONVERTIBLE TROCAR (TROCAR) ×2 IMPLANT
TIP UTERINE 5.1X6CM LAV DISP (MISCELLANEOUS) ×2 IMPLANT
TIP UTERINE 6.7X10CM GRN DISP (MISCELLANEOUS) IMPLANT
TIP UTERINE 6.7X6CM WHT DISP (MISCELLANEOUS) IMPLANT
TIP UTERINE 6.7X8CM BLUE DISP (MISCELLANEOUS) IMPLANT
TOWEL OR 17X24 6PK STRL BLUE (TOWEL DISPOSABLE) ×6 IMPLANT
TRAY FOLEY BAG SILVER LF 16FR (SET/KITS/TRAYS/PACK) ×2 IMPLANT
TROCAR 12M 150ML BLUNT (TROCAR) IMPLANT
TROCAR DISP BLADELESS 8 DVNC (TROCAR) ×1 IMPLANT
TROCAR DISP BLADELESS 8MM (TROCAR) ×1
TROCAR OPTI TIP 12M 100M (ENDOMECHANICALS) IMPLANT
TROCAR PORT AIRSEAL 5X120 (TROCAR) ×2 IMPLANT
TROCAR XCEL 12X100 BLDLESS (ENDOMECHANICALS) ×2 IMPLANT
WATER STERILE IRR 1000ML POUR (IV SOLUTION) ×6 IMPLANT

## 2016-02-10 NOTE — Op Note (Signed)
02/10/2016  12:04 PM  PATIENT:  Danielle Oneill  45 y.o. female  PRE-OPERATIVE DIAGNOSIS:  Recurrent Severe Right Pelvic pain with Right Ovarian Cyst, History of Endometriosis, Uterine Myomas, Pelvic Adhesions  POST-OPERATIVE DIAGNOSIS: Recurrent Severe Right Pelvic pain with Right Ovarian Cyst, History of Endometriosis, Uterine Myomas, Pelvic Adhesions    PROCEDURE:  Procedure(s): ROBOTIC ASSISTED RIGHT SALPINGO OOPHORECTOMY, LYSIS OF ADHESIONS, TREATMENT OF ENDOMETRIOSIS, MYOMECTOMIES (2) WITH EXTRACTION BAG SYSTEM  SURGEON:  Surgeon(s): Princess Bruins, MD  ASSISTANTS: Artelia Laroche   ANESTHESIA:   general   PROCEDURE:  Under general anesthesia with endotracheal intubation the patient is in lithotomy position. She is prepped with DuraPrep on the abdomen and with Betadine on the suprapubic, vulvar and vaginal areas. She is draped as usual. Vaginally, a #10 Rumi with the small Kho Ring are put in place. The Foley is inserted in the bladder. The weighted speculum and tenaculum are removed.  t the abdomen, we infiltrate Marcaine one quarter plain supraumbilically. We make a 1.5 cm incision with the scalpel at that level. The aponeurosis is grasped with cokers. The aponeurosis is opened with Mayo scissors under direct vision. The parietal peritoneum is opened bluntly with a finger after grasping it with hemostats.  A pursestring stitch is done with Vicryl 0 at the aponeurosis.  The Sheryle Hail is inserted under direct vision and up pneumoperitoneum is created with CO2.  Inspection of the abdominopelvic cavities revealed no adhesion in the abdomen. The liver gallbladder and appendix are normal. Pictures are taken.  At the pelvis we note a uterus with many fibroids, especially of very large one (about 7 cm) that is pedunculated with a large base at the upper left posterior uterus.  Another pedunculated fibroid is present at the anterior right aspect of the uterus ( about 2 cm).  The left tube and ovary are  absent.  The right ovary presents a 5 cm endometrioma with multiple other small functional cysts.  The right ovary is adherent to the ovarian fossa and the posterior aspect of the uterus.  A small superficial lesion of endometriosis is visible at the right uterosacral ligament.        We use a semicircular configuration for port placement. 2 robotic ports are placed on the right side and one robotic ports at the lower left. A 5 mm assistant port is placed at the upper left.  All sides are infiltrated with Marcaine or quarter plain and small incisions are made with the scalpel.  All ports are inserted under direct vision.  The patient is placed in steep Trendelenburg.  She tolerates it well.  The robot is docked from the right side.  Robotic instruments are inserted under direct vision with the EndoShears scissor in the first arm, the PK in the second arm and the prograsp in the third arm.  We go to the console.      The right ureter is in normal anatomy position with good peristalsis.  We proceed with lysis of adhesions at the right adnexa.  We then cauterize and section the right infundibulopelvic ligament.  The right ovary is freed from the right ovarian fossa.  We then cauterized and section the right tube at the cornua.  We then cauterize and section the right utero-ovarian ligament.  The right tube and ovary kept in the posterior cul-de-sac.  We then cauterized the small lesion of endometriosis at the right uterosacral ligament.  No other lesion of endometriosis is seen.  The decision is made to  proceed with myomectomies of the large left posterior myoma and the smaller right anterior myoma.  Further right anterior myoma we simply cauterized in section at about 1 cm from the base and the myoma is removed very easily.  We further cauterized the base with the PK.  Good hemostasis.  We then proceeded with the left posterior myomectomy.  Given the large base we infiltrate with vasopressin.  We cauterized and  section at about 2 cm from the base.  Myomectomy is easily completed.  Both myomas are placed in the posterior cul-de-sac.  The instruments are switched to a cutting needle driver in the first arm, the long tip in the second arm and the PK in the third arm.  We used a V lock 0 at 9 inches to close the serosa at the left posterior uterus.  We use a baseball stitch from left to right and come back halfway on our steps.  Hemostasis is adequate.  The needle is parked on the left abdominal wall.  We confirm hemostasis of all pedicles at the right adnexal area and at the anterior lower uterus where the myomectomy was done.  All robotic instruments are removed.  The robot is undocked.  We go to laparoscopy time.      We used and a 8 mm camera to remove the needle through the [place] port.  We confirm hemostasis at all levels once more.  He Sheryle Hail is then removed.  We insert the extraction bag system.  The Sheryle Hail is put back into place and the pneumoperitoneum is re-created.   The 2 myomas and the right ovary and right tube are placed in the extraction bag.  The Sheryle Hail is removed and the extraction bag is pulled at the supraumbilical incision.  All 3 specimens are removed, the larger fibroid is removed using a knife in a semicircular fashion and creating long stripes.  All 3 specimens are sent to pathology.  E then go backwith the [place] and re-create the pneumoperitoneum.  We irrigate and suction the abdominal pelvic cavities. We confirmed hemostasis.  Interceed is applied at the right adnexa and at the sites where the posterior myomectomy was done.  Pictures were taken at the beginning of surgery and at the end.  We removed all laparoscopy instruments.  All ports are removed. The CO2 was evacuated.  We closed the supraumbilical incision by closing the aponeurosis with a strong running suture of Vicryl 0.  We then closed the adipose tissue with separate stitches of Vicryl 4-0.  All skin incisions are closed with a  subcuticular stitch of Vicryl 4-0.  dermabond is applied on all skin incisions.  The Rumi and Kho-ring are removed from the vagina.  The patient is brought to recovery room in good and stable status.  ESTIMATED BLOOD LOSS: 50 cc   Intake/Output Summary (Last 24 hours) at 02/10/16 1204 Last data filed at 02/10/16 1150  Gross per 24 hour  Intake   1300 ml  Output    250 ml  Net   1050 ml     BLOOD ADMINISTERED:none   LOCAL MEDICATIONS USED:  MARCAINE     SPECIMEN:  Source of Specimen:  Right Ovary and Right Tube, Myomas  DISPOSITION OF SPECIMEN:  PATHOLOGY  COUNTS:  YES  PLAN OF CARE: Transfer to PACU  Princess Bruins MD  02/10/2016 at 12:05 pm

## 2016-02-10 NOTE — Transfer of Care (Signed)
Immediate Anesthesia Transfer of Care Note  Patient: Danielle Oneill  Procedure(s) Performed: Procedure(s): ROBOTIC ASSISTED RIGHT SALPINGO OOPHORECTOMY LYSIS OF ADHESIONS TREATMENT OF ENDOMETRIOSIS, MYOMECTOMY (Right)  Patient Location: PACU  Anesthesia Type:General  Level of Consciousness: awake, alert  and oriented  Airway & Oxygen Therapy: Patient Spontanous Breathing and Patient connected to nasal cannula oxygen  Post-op Assessment: Report given to RN and Post -op Vital signs reviewed and stable  Post vital signs: Reviewed and stable  Last Vitals:  Filed Vitals:   02/10/16 0707  BP: 135/81  Pulse: 84  Temp: 36.6 C  Resp: 16    Complications: No apparent anesthesia complications

## 2016-02-10 NOTE — OR Nursing (Signed)
Discharge instructions reviewed with patient  By dawn harvell rn. Discharged to home and wheeled out by dawn harvellrn

## 2016-02-10 NOTE — Discharge Instructions (Addendum)
Unilateral Salpingo-Oophorectomy, Care After Refer to this sheet in the next few weeks. These instructions provide you with information on caring for yourself after your procedure. Your health care provider may also give you more specific instructions. Your treatment has been planned according to current medical practices, but problems sometimes occur. Call your health care provider if you have any problems or questions after your procedure. WHAT TO EXPECT AFTER THE PROCEDURE After your procedure, it is typical to have the following:  Abdominal pain that can be controlled with pain medicine.  Vaginal spotting.  Constipation. HOME CARE INSTRUCTIONS   Get plenty of rest and sleep.  Only take over-the-counter or prescription medicines as directed by your health care provider. Do not take aspirin. It can cause bleeding.  Keep incision areas clean and dry. Remove or change any bandages (dressings) only as directed by your health care provider.  Follow your health care provider's advice regarding diet.  Drink enough fluids to keep your urine clear or pale yellow.  Limit exercise and activities as directed by your health care provider. Do not lift anything heavier than 5 pounds (2.3 kg) until your health care provider approves.  Do not drive until your health care provider approves.  Do not drink alcohol until your health care provider approves.  Do not have sexual intercourse until your health care provider says it is OK.  Take your temperature twice a day and write it down.  If you become constipated, you may:  Ask your health care provider about taking a mild laxative.  Add more fruit and bran to your diet.  Drink more fluids.  Follow up with your health care provider as directed. SEEK MEDICAL CARE IF:   You have swelling or redness in the incision area.  You develop a rash.  You feel lightheaded.  You have pain that is not controlled with medicine.  You have pain,  swelling, or redness where the IV access tube was placed. SEEK IMMEDIATE MEDICAL CARE IF:  You have a fever.  You develop increasing abdominal pain.  You see pus coming out of the incision, or the incision is separating.  You notice a bad smell coming from the wound or dressing.  You have excessive vaginal bleeding.  You feel sick to your stomach (nauseous) and vomit.  You have leg or chest pain.  You have pain when you urinate.  You develop shortness of breath.  You pass out.   This information is not intended to replace advice given to you by your health care provider. Make sure you discuss any questions you have with your health care provider.   Document Released: 09/01/2009 Document Revised: 08/26/2013 Document Reviewed: 04/29/2013 Elsevier Interactive Patient Education 2016 [place] INSTRUCTIONS: Laparoscopy  The following instructions have been prepared to help you care for yourself upon your return home today.  Wound care:  Do not get the incision wet for the first 24 hours. The incision should be kept clean and dry.  The Band-Aids or dressings may be removed the day after surgery.  Should the incision become sore, red, and swollen after the first week, check with your doctor.  Personal hygiene:  Shower the day after your procedure.  Activity and limitations:  Do NOT drive or operate any equipment today.  Do NOT lift anything more than 15 pounds for 2-3 weeks after surgery.  Do NOT rest in bed all day.  Walking is encouraged. Walk each day, starting slowly with 5-minute walks 3 or  4 times a day. Slowly increase the length of your walks.  Walk up and down stairs slowly.  Do NOT do strenuous activities, such as golfing, playing tennis, bowling, running, biking, weight lifting, gardening, mowing, or vacuuming for 2-4 weeks. Ask your doctor when it is okay to start.  Diet: Eat a light meal as desired this evening. You may resume your usual  diet tomorrow.  Return to work: This is dependent on the type of work you do. For the most part you can return to a desk job within a week of surgery. If you are more active at work, please discuss this with your doctor.  What to expect after your surgery: You may have a slight burning sensation when you urinate on the first day. You may have a very small amount of blood in the urine. Expect to have a small amount of vaginal discharge/light bleeding for 1-2 weeks. It is not unusual to have abdominal soreness and bruising for up to 2 weeks. You may be tired and need more rest for about 1 week. You may experience shoulder pain for 24-72 hours. Lying flat in bed may relieve it.  Call your doctor for any of the following:  Develop a fever of 100.4 or greater  Inability to urinate 6 hours after discharge from hospital  Severe pain not relieved by pain medications  Persistent of heavy bleeding at incision site  Redness or swelling around incision site after a week  Increasing nausea or vomiting  Patient Signature________________________________________ Nurse Signature_________________________________________DISCHARGE INSTRUCTIONS: Laparoscopy  The following instructions have been prepared to help you care for yourself upon your return home today.  Wound care:  Do not get the incision wet for the first 24 hours. The incision should be kept clean and dry.  The Band-Aids or dressings may be removed the day after surgery.  Should the incision become sore, red, and swollen after the first week, check with your doctor.  Personal hygiene:  Shower the day after your procedure.  Activity and limitations:  Do NOT drive or operate any equipment today.  Do NOT lift anything more than 15 pounds for 2-3 weeks after surgery.  Do NOT rest in bed all day.  Walking is encouraged. Walk each day, starting slowly with 5-minute walks 3 or 4 times a day. Slowly increase the length of your walks.   Walk up and down stairs slowly.  Do NOT do strenuous activities, such as golfing, playing tennis, bowling, running, biking, weight lifting, gardening, mowing, or vacuuming for 2-4 weeks. Ask your doctor when it is okay to start.  Diet: Eat a light meal as desired this evening. You may resume your usual diet tomorrow.  Return to work: This is dependent on the type of work you do. For the most part you can return to a desk job within a week of surgery. If you are more active at work, please discuss this with your doctor.  What to expect after your surgery: You may have a slight burning sensation when you urinate on the first day. You may have a very small amount of blood in the urine. Expect to have a small amount of vaginal discharge/light bleeding for 1-2 weeks. It is not unusual to have abdominal soreness and bruising for up to 2 weeks. You may be tired and need more rest for about 1 week. You may experience shoulder pain for 24-72 hours. Lying flat in bed may relieve it.  Call your doctor for any of the  following:  Develop a fever of 100.4 or greater  Inability to urinate 6 hours after discharge from hospital  Severe pain not relieved by pain medications  Persistent of heavy bleeding at incision site  Redness or swelling around incision site after a week  Increasing nausea or vomiting  Patient Signature________________________________________ Nurse Signature_________________________________________

## 2016-02-10 NOTE — Anesthesia Postprocedure Evaluation (Signed)
Anesthesia Post Note  Patient: Danielle Oneill  Procedure(s) Performed: Procedure(s) (LRB): ROBOTIC ASSISTED RIGHT SALPINGO OOPHORECTOMY LYSIS OF ADHESIONS TREATMENT OF ENDOMETRIOSIS, MYOMECTOMY (Right)  Patient location during evaluation: PACU Anesthesia Type: General Level of consciousness: awake Pain management: pain level controlled Vital Signs Assessment: post-procedure vital signs reviewed and stable Respiratory status: spontaneous breathing and respiratory function stable Cardiovascular status: stable Postop Assessment: no signs of nausea or vomiting Anesthetic complications: no    Last Vitals:  Filed Vitals:   02/10/16 1230 02/10/16 1245  BP: 115/65   Pulse: 75 80  Temp:    Resp: 8 16    Last Pain:  Filed Vitals:   02/10/16 1248  PainSc: 7                  Lesieli Bresee

## 2016-02-10 NOTE — Anesthesia Preprocedure Evaluation (Signed)
Anesthesia Evaluation  Patient identified by MRN, date of birth, ID band Patient awake    Reviewed: Allergy & Precautions, NPO status , Patient's Chart, lab work & pertinent test results  History of Anesthesia Complications Negative for: history of anesthetic complications  Airway Mallampati: I  TM Distance: >3 FB Neck ROM: Full    Dental  (+) Teeth Intact   Pulmonary neg shortness of breath, asthma , neg sleep apnea, neg recent URI,    breath sounds clear to auscultation       Cardiovascular negative cardio ROS   Rhythm:Regular     Neuro/Psych PSYCHIATRIC DISORDERS Anxiety Depression negative neurological ROS     GI/Hepatic negative GI ROS, Neg liver ROS,   Endo/Other  negative endocrine ROS  Renal/GU negative Renal ROS     Musculoskeletal   Abdominal   Peds  Hematology negative hematology ROS (+)   Anesthesia Other Findings   Reproductive/Obstetrics                             Anesthesia Physical Anesthesia Plan  ASA: II  Anesthesia Plan: General   Post-op Pain Management:    Induction: Intravenous  Airway Management Planned: Oral ETT  Additional Equipment: None  Intra-op Plan:   Post-operative Plan: Extubation in OR  Informed Consent: I have reviewed the patients History and Physical, chart, labs and discussed the procedure including the risks, benefits and alternatives for the proposed anesthesia with the patient or authorized representative who has indicated his/her understanding and acceptance.   Dental advisory given  Plan Discussed with: CRNA and Surgeon  Anesthesia Plan Comments:         Anesthesia Quick Evaluation

## 2016-02-10 NOTE — Discharge Summary (Signed)
  Physician Discharge Summary  Patient ID: Danielle Oneill MRN: [id number] DOB/AGE: 1971-01-20 45 y.o.  Admit date: 02/10/2016 Discharge date: 02/10/2016  Admission Diagnoses: Recurrent Severe Right Pelvic pain with Right Ovarian Cyst, History of Endometriosis, Uterine Myomas, Pelvic Adhesions   Discharge Diagnoses: Recurrent Severe Right Pelvic pain with Right Ovarian Cyst, History of Endometriosis, Uterine Myomas, Pelvic Adhesions         Active Problems:   * No active hospital problems. *   Discharged Condition: good  Hospital Course: Outpatient  Consults: None  Treatments: surgery: Robotic Right Salpingo-Oophorectomy, Lysis of Adhesions, Treatment of Endometriosis, Myomectomies (2)  Disposition: 01-Home or Self Care     Medication List    ASK your doctor about these medications        acyclovir 200 MG capsule  Commonly known as:  ZOVIRAX  Take 1 capsule (200 mg total) by mouth 4 (four) times daily. With outbreak     albuterol 108 (90 Base) MCG/ACT inhaler  Commonly known as:  PROAIR HFA  INHALE 2 PUFFS INTO THE LUNGS EVERY 4 (FOUR) HOURS AS NEEDED     ALPRAZolam 0.5 MG tablet  Commonly known as:  XANAX  2 po nightly prn insomnia and 1-2 tablets by mouth as needed for anxiety during the day.     BIOTIN PO  Take 1 tablet by mouth daily.     escitalopram 10 MG tablet  Commonly known as:  LEXAPRO  Take 1 tablet (10 mg total) by mouth daily.     Fish Oil 1000 MG Caps  Take 1 capsule by mouth daily.     multivitamin tablet  Take 1 tablet by mouth daily.     QVAR 40 MCG/ACT inhaler  Generic drug:  beclomethasone  Inhale 2 puffs into the lungs daily as needed (for asthma while sick).     Vitamin D2 2000 units Tabs  Take 1 tablet by mouth daily.           Follow-up Information    Follow up with Mellody Masri,MARIE-LYNE, MD In 3 weeks.   Specialty:  Obstetrics and Gynecology   Contact information:   [address] [address] [phone]        Signed: Princess Bruins, MD 02/10/2016, 12:36 PM
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
## 2016-02-10 NOTE — Anesthesia Procedure Notes (Signed)
Procedure Name: Intubation Date/Time: 02/10/2016 8:48 AM Performed by: Lorris Carducci G Pre-anesthesia Checklist: Patient identified, Timeout performed, Emergency Drugs available, Suction available and Patient being monitored Patient Re-evaluated:Patient Re-evaluated prior to inductionOxygen Delivery Method: Circle system utilized Preoxygenation: Pre-oxygenation with 100% oxygen Intubation Type: IV induction Ventilation: Mask ventilation without difficulty Laryngoscope Size: Mac and 3 Grade View: Grade II Tube type: Oral Number of attempts: 1 Airway Equipment and Method: Stylet Placement Confirmation: ETT inserted through vocal cords under direct vision,  breath sounds checked- equal and bilateral and positive ETCO2 Secured at: 22 cm Tube secured with: Tape Dental Injury: Teeth and Oropharynx as per pre-operative assessment

## 2016-02-13 ENCOUNTER — Encounter (HOSPITAL_COMMUNITY): Payer: Self-pay | Admitting: Obstetrics & Gynecology

## 2016-03-17 ENCOUNTER — Other Ambulatory Visit: Payer: Self-pay | Admitting: Family Medicine

## 2016-03-19 NOTE — Telephone Encounter (Signed)
Medication filled to pharmacy as requested.   

## 2016-05-28 ENCOUNTER — Ambulatory Visit (INDEPENDENT_AMBULATORY_CARE_PROVIDER_SITE_OTHER): Payer: 59 | Admitting: Family Medicine

## 2016-05-28 ENCOUNTER — Encounter: Payer: Self-pay | Admitting: Family Medicine

## 2016-05-28 VITALS — BP 123/81 | HR 81 | Temp 98.0°F | Resp 16 | Ht 65.0 in | Wt 165.4 lb

## 2016-05-28 DIAGNOSIS — Z0000 Encounter for general adult medical examination without abnormal findings: Secondary | ICD-10-CM

## 2016-05-28 LAB — HEPATIC FUNCTION PANEL
ALT: 16 U/L (ref 0–35)
AST: 20 U/L (ref 0–37)
Albumin: 4.4 g/dL (ref 3.5–5.2)
Alkaline Phosphatase: 39 U/L (ref 39–117)
Bilirubin, Direct: 0.1 mg/dL (ref 0.0–0.3)
Total Bilirubin: 0.6 mg/dL (ref 0.2–1.2)
Total Protein: 6.7 g/dL (ref 6.0–8.3)

## 2016-05-28 LAB — LIPID PANEL
Cholesterol: 231 mg/dL — ABNORMAL HIGH (ref 0–200)
HDL: 92.8 mg/dL (ref >39.00)
LDL Cholesterol: 126 mg/dL — ABNORMAL HIGH (ref 0–99)
NonHDL: 138.48
Total CHOL/HDL Ratio: 2
Triglycerides: 62 mg/dL (ref 0.0–149.0)
VLDL: 12.4 mg/dL (ref 0.0–40.0)

## 2016-05-28 LAB — BASIC METABOLIC PANEL
BUN: 20 mg/dL (ref 6–23)
CO2: 28 mEq/L (ref 19–32)
Calcium: 9.5 mg/dL (ref 8.4–10.5)
Chloride: 105 mEq/L (ref 96–112)
Creatinine, Ser: 0.64 mg/dL (ref 0.40–1.20)
GFR: 106.33 mL/min (ref >60.00)
Glucose, Bld: 86 mg/dL (ref 70–99)
Potassium: 4.7 mEq/L (ref 3.5–5.1)
Sodium: 140 mEq/L (ref 135–145)

## 2016-05-28 LAB — CBC WITH DIFFERENTIAL/PLATELET
Basophils Absolute: 0 10*3/uL (ref 0.0–0.1)
Basophils Relative: 0.6 % (ref 0.0–3.0)
Eosinophils Absolute: 0.7 10*3/uL (ref 0.0–0.7)
Eosinophils Relative: 11.4 % — ABNORMAL HIGH (ref 0.0–5.0)
HCT: 39.7 % (ref 36.0–46.0)
Hemoglobin: 13.3 g/dL (ref 12.0–15.0)
Lymphocytes Relative: 30.9 % (ref 12.0–46.0)
Lymphs Abs: 2 10*3/uL (ref 0.7–4.0)
MCHC: 33.5 g/dL (ref 30.0–36.0)
MCV: 95.6 fl (ref 78.0–100.0)
Monocytes Absolute: 0.5 10*3/uL (ref 0.1–1.0)
Monocytes Relative: 7.4 % (ref 3.0–12.0)
Neutro Abs: 3.2 10*3/uL (ref 1.4–7.7)
Neutrophils Relative %: 49.7 % (ref 43.0–77.0)
Platelets: 286 10*3/uL (ref 150.0–400.0)
RBC: 4.15 Mil/uL (ref 3.87–5.11)
RDW: 15.1 % (ref 11.5–15.5)
WBC: 6.5 10*3/uL (ref 4.0–10.5)

## 2016-05-28 LAB — VITAMIN D 25 HYDROXY (VIT D DEFICIENCY, FRACTURES): VITD: 57.79 ng/mL (ref 30.00–100.00)

## 2016-05-28 LAB — TSH: TSH: 0.92 u[IU]/mL (ref 0.35–4.50)

## 2016-05-28 MED ORDER — ALPRAZOLAM 0.5 MG PO TABS: ORAL_TABLET | ORAL | Status: DC

## 2016-05-28 MED ORDER — ALBUTEROL SULFATE HFA 108 (90 BASE) MCG/ACT IN AERS: INHALATION_SPRAY | RESPIRATORY_TRACT | Status: DC

## 2016-05-28 NOTE — Assessment & Plan Note (Signed)
Pt's PE WNL.  UTD on Tdap, GYN.  Check labs.  Anticipatory guidance provided.

## 2016-05-28 NOTE — Progress Notes (Signed)
Pre visit review using our clinic review tool, if applicable. No additional management support is needed unless otherwise documented below in the visit note. 

## 2016-05-28 NOTE — Patient Instructions (Signed)
Follow up in 1 year or as needed We'll notify you of your lab results and make any changes if needed Keep up the good work on healthy diet and regular exercise- you look great! Call with any questions or concerns Have a great summer!!! 

## 2016-05-28 NOTE — Progress Notes (Signed)
   Subjective:    Patient ID: Danielle Oneill, female    DOB: August 22, 1971, 45 y.o.   MRN: [id number]  HPI CPE- UTD on GYN w/ Dr Dellis Filbert.  Too young for colonoscopy.     Review of Systems Patient reports no vision/ hearing changes, adenopathy,fever, weight change,  persistant/recurrent hoarseness , swallowing issues, chest pain, palpitations, edema, persistant/recurrent cough, hemoptysis, dyspnea (rest/exertional/paroxysmal nocturnal), gastrointestinal bleeding (melena, rectal bleeding), abdominal pain, significant heartburn, bowel changes, GU symptoms (dysuria, hematuria, incontinence), Gyn symptoms (abnormal  bleeding, pain),  syncope, focal weakness, memory loss, numbness & tingling, skin/hair/nail changes, abnormal bruising or bleeding, anxiety, or depression.   + fatigue    Objective:   Physical Exam General Appearance:    Alert, cooperative, no distress, appears stated age  Head:    Normocephalic, without obvious abnormality, atraumatic  Eyes:    PERRL, conjunctiva/corneas clear, EOM's intact, fundi    benign, both eyes  Ears:    Normal TM's and external ear canals, both ears  Nose:   Nares normal, septum midline, mucosa normal, no drainage    or sinus tenderness  Throat:   Lips, mucosa, and tongue normal; teeth and gums normal  Neck:   Supple, symmetrical, trachea midline, no adenopathy;    Thyroid: no enlargement/tenderness/nodules  Back:     Symmetric, no curvature, ROM normal, no CVA tenderness  Lungs:     Clear to auscultation bilaterally, respirations unlabored  Chest Wall:    No tenderness or deformity   Heart:    Regular rate and rhythm, S1 and S2 normal, no murmur, rub   or gallop  Breast Exam:    Deferred to GYN  Abdomen:     Soft, non-tender, bowel sounds active all four quadrants,    no masses, no organomegaly  Genitalia:    Deferred to GYN  Rectal:    Extremities:   Extremities normal, atraumatic, no cyanosis or edema  Pulses:   2+ and symmetric all extremities    Skin:   Skin color, texture, turgor normal, no rashes or lesions  Lymph nodes:   Cervical, supraclavicular, and axillary nodes normal  Neurologic:   CNII-XII intact, normal strength, sensation and reflexes    throughout          Assessment & Plan:

## 2016-05-29 ENCOUNTER — Encounter: Payer: Self-pay | Admitting: General Practice

## 2016-06-04 ENCOUNTER — Other Ambulatory Visit: Payer: Self-pay | Admitting: Family Medicine

## 2016-06-04 NOTE — Telephone Encounter (Signed)
Medication filled to pharmacy as requested.   

## 2016-07-24 ENCOUNTER — Telehealth: Payer: Self-pay | Admitting: Emergency Medicine

## 2016-07-24 NOTE — Telephone Encounter (Signed)
Patient calling requesting a refill of her Xanax. Last rx was 05/28/16 #120 1 RF Last OV 05/28/16 Patient would like rx sent to [facility] in [facility] Patient also has questions if she needs a letter or form stating her current medications she is taking. She is leaving the country on Thursday. Please advise

## 2016-07-25 ENCOUNTER — Encounter: Payer: Self-pay | Admitting: General Practice

## 2016-07-25 MED ORDER — ALPRAZOLAM 0.5 MG PO TABS: ORAL_TABLET | ORAL | 1 refills | Status: DC

## 2016-07-25 NOTE — Telephone Encounter (Signed)
Ok for #120 w/ 1 refill but this is to be used as needed for anxiety- not 2 additional tabs every day (which is how she is taking it to require a refill).  No form needed if her name is on prescription bottles

## 2016-07-25 NOTE — Telephone Encounter (Signed)
Patient notified of PCP recommendations and is agreement and expresses an understanding.  Letter printed to notate her medication list.

## 2016-07-25 NOTE — Addendum Note (Signed)
Addended by: Davis Gourd on: 07/25/2016 09:59 AM   Modules accepted: Orders

## 2016-09-14 ENCOUNTER — Other Ambulatory Visit: Payer: Self-pay | Admitting: Family Medicine

## 2016-09-28 ENCOUNTER — Other Ambulatory Visit: Payer: Self-pay | Admitting: Family Medicine

## 2016-10-22 ENCOUNTER — Telehealth: Payer: Self-pay | Admitting: Family Medicine

## 2016-10-22 ENCOUNTER — Other Ambulatory Visit: Payer: Self-pay | Admitting: General Practice

## 2016-10-22 MED ORDER — ESCITALOPRAM OXALATE 10 MG PO TABS: 10.0000 mg | ORAL_TABLET | Freq: Every day | ORAL | 1 refills | Status: DC

## 2016-10-22 NOTE — Telephone Encounter (Signed)
Last OV 05/28/16 Alprazolam last filled 07/25/16 #120 with 1

## 2016-10-22 NOTE — Telephone Encounter (Signed)
Pt calling to request refill of ALPRAZolam (XANAX) 0.5 MG tablet.  [facility]  [facility] [place] [address] [phone] (Phone) [phone] (Fax)

## 2016-10-23 ENCOUNTER — Other Ambulatory Visit: Payer: Self-pay | Admitting: General Practice

## 2016-10-23 MED ORDER — ALPRAZOLAM 0.5 MG PO TABS: ORAL_TABLET | ORAL | 1 refills | Status: DC

## 2016-10-23 NOTE — Telephone Encounter (Signed)
[place] for #120, 1 refill

## 2016-10-23 NOTE — Telephone Encounter (Signed)
Medication filled to pharmacy as requested.   

## 2016-11-27 LAB — HM MAMMOGRAPHY: HM Mammogram: NORMAL (ref 0–4)

## 2016-11-27 LAB — HM PAP SMEAR

## 2017-01-04 ENCOUNTER — Other Ambulatory Visit: Payer: Self-pay | Admitting: General Practice

## 2017-01-04 MED ORDER — ALPRAZOLAM 0.5 MG PO TABS: ORAL_TABLET | ORAL | 0 refills | Status: DC

## 2017-01-04 NOTE — Telephone Encounter (Signed)
Last OV 05/28/16 Alprazolam last filled 10/23/16 #120 with 1

## 2017-01-31 ENCOUNTER — Other Ambulatory Visit: Payer: Self-pay | Admitting: Family Medicine

## 2017-03-15 ENCOUNTER — Other Ambulatory Visit: Payer: Self-pay | Admitting: General Practice

## 2017-03-15 MED ORDER — ALPRAZOLAM 0.5 MG PO TABS: ORAL_TABLET | ORAL | 0 refills | Status: DC

## 2017-03-15 NOTE — Telephone Encounter (Signed)
Medication filled to pharmacy as requested.   

## 2017-03-15 NOTE — Telephone Encounter (Signed)
Last OV 10/22/16 Alprazolam last filled 01/04/17

## 2017-05-17 ENCOUNTER — Telehealth: Payer: Self-pay | Admitting: Family Medicine

## 2017-05-17 MED ORDER — ALPRAZOLAM 0.5 MG PO TABS: ORAL_TABLET | ORAL | 0 refills | Status: DC

## 2017-05-17 NOTE — Telephone Encounter (Signed)
Pt needs [facility]ill on [facility]am[facility] [place]

## 2017-05-17 NOTE — Telephone Encounter (Signed)
Last OV 05/28/16 CPE, upcoming CPE scheduled for 05/31/17 Alprazolam last filled 03/15/17 #120 with 0

## 2017-05-17 NOTE — Telephone Encounter (Signed)
[place] for [facility]

## 2017-05-17 NOTE — Telephone Encounter (Signed)
Medication filled to pharmacy as requested.   

## 2017-05-31 ENCOUNTER — Ambulatory Visit (INDEPENDENT_AMBULATORY_CARE_PROVIDER_SITE_OTHER): Payer: 59 | Admitting: Family Medicine

## 2017-05-31 ENCOUNTER — Encounter: Payer: Self-pay | Admitting: Family Medicine

## 2017-05-31 ENCOUNTER — Encounter: Payer: Self-pay | Admitting: General Practice

## 2017-05-31 VITALS — BP 114/82 | HR 70 | Temp 97.8°F | Ht 65.0 in | Wt 169.0 lb

## 2017-05-31 DIAGNOSIS — Z0000 Encounter for general adult medical examination without abnormal findings: Secondary | ICD-10-CM

## 2017-05-31 LAB — CBC WITH DIFFERENTIAL/PLATELET
Basophils Absolute: 0.1 10*3/uL (ref 0.0–0.1)
Basophils Relative: 0.9 % (ref 0.0–3.0)
Eosinophils Absolute: 0.6 10*3/uL (ref 0.0–0.7)
Eosinophils Relative: 8.8 % — ABNORMAL HIGH (ref 0.0–5.0)
HCT: 38.7 % (ref 36.0–46.0)
Hemoglobin: 13.2 g/dL (ref 12.0–15.0)
Lymphocytes Relative: 25.9 % (ref 12.0–46.0)
Lymphs Abs: 1.7 10*3/uL (ref 0.7–4.0)
MCHC: 34 g/dL (ref 30.0–36.0)
MCV: 96.8 fl (ref 78.0–100.0)
Monocytes Absolute: 0.4 10*3/uL (ref 0.1–1.0)
Monocytes Relative: 6.6 % (ref 3.0–12.0)
Neutro Abs: 3.8 10*3/uL (ref 1.4–7.7)
Neutrophils Relative %: 57.8 % (ref 43.0–77.0)
Platelets: 313 10*3/uL (ref 150.0–400.0)
RBC: 4 Mil/uL (ref 3.87–5.11)
RDW: 13.6 % (ref 11.5–15.5)
WBC: 6.6 10*3/uL (ref 4.0–10.5)

## 2017-05-31 LAB — VITAMIN D 25 HYDROXY (VIT D DEFICIENCY, FRACTURES): VITD: 44.5 ng/mL (ref 30.00–100.00)

## 2017-05-31 LAB — BASIC METABOLIC PANEL
BUN: 16 mg/dL (ref 6–23)
CO2: 27 mEq/L (ref 19–32)
Calcium: 9.3 mg/dL (ref 8.4–10.5)
Chloride: 103 mEq/L (ref 96–112)
Creatinine, Ser: 0.68 mg/dL (ref 0.40–1.20)
GFR: 98.71 mL/min (ref >60.00)
Glucose, Bld: 88 mg/dL (ref 70–99)
Potassium: 4.4 mEq/L (ref 3.5–5.1)
Sodium: 138 mEq/L (ref 135–145)

## 2017-05-31 LAB — LIPID PANEL
Cholesterol: 203 mg/dL — ABNORMAL HIGH (ref 0–200)
HDL: 83.1 mg/dL (ref >39.00)
LDL Cholesterol: 97 mg/dL (ref 0–99)
NonHDL: 119.4
Total CHOL/HDL Ratio: 2
Triglycerides: 112 mg/dL (ref 0.0–149.0)
VLDL: 22.4 mg/dL (ref 0.0–40.0)

## 2017-05-31 LAB — HEPATIC FUNCTION PANEL
ALT: 13 U/L (ref 0–35)
AST: 17 U/L (ref 0–37)
Albumin: 4.3 g/dL (ref 3.5–5.2)
Alkaline Phosphatase: 38 U/L — ABNORMAL LOW (ref 39–117)
Bilirubin, Direct: 0.1 mg/dL (ref 0.0–0.3)
Total Bilirubin: 0.5 mg/dL (ref 0.2–1.2)
Total Protein: 6.6 g/dL (ref 6.0–8.3)

## 2017-05-31 LAB — TSH: TSH: 1.44 u[IU]/mL (ref 0.35–4.50)

## 2017-05-31 NOTE — Assessment & Plan Note (Signed)
Pt's PE WNL.  UTD on GYN, Tdap.  Check labs.  Anticipatory guidance provided.  

## 2017-05-31 NOTE — Patient Instructions (Signed)
Follow up in 1 year or as needed We'll notify you of your lab results and make any changes if needed Continue to work on healthy diet and regular exercise- you look great! Call with any questions or concerns Have a great time in [place]!!!

## 2017-05-31 NOTE — Progress Notes (Signed)
   Subjective:    Patient ID: Danielle Oneill, female    DOB: [birth year], 46 y.o.   MRN: [id number]  HPI CPE- UTD on GYN (pap and mammo), Tdap.     Review of Systems Patient reports no vision/ hearing changes, adenopathy,fever, weight change,  persistant/recurrent hoarseness , swallowing issues, chest pain, palpitations, edema, persistant/recurrent cough, hemoptysis, dyspnea (rest/exertional/paroxysmal nocturnal), gastrointestinal bleeding (melena, rectal bleeding), abdominal pain, significant heartburn, bowel changes, GU symptoms (dysuria, hematuria, incontinence), Gyn symptoms (abnormal  bleeding, pain),  syncope, focal weakness, memory loss, numbness & tingling, skin/hair/nail changes, abnormal bruising or bleeding, anxiety, or depression.   + Fatigue    Objective:   Physical Exam General Appearance:    Alert, cooperative, no distress, appears stated age  Head:    Normocephalic, without obvious abnormality, atraumatic  Eyes:    PERRL, conjunctiva/corneas clear, EOM's intact, fundi    benign, both eyes  Ears:    Normal TM's and external ear canals, both ears  Nose:   Nares normal, septum midline, mucosa normal, no drainage    or sinus tenderness  Throat:   Lips, mucosa, and tongue normal; teeth and gums normal  Neck:   Supple, symmetrical, trachea midline, no adenopathy;    Thyroid: no enlargement/tenderness/nodules  Back:     Symmetric, no curvature, ROM normal, no CVA tenderness  Lungs:     Clear to auscultation bilaterally, respirations unlabored  Chest Wall:    No tenderness or deformity   Heart:    Regular rate and rhythm, S1 and S2 normal, no murmur, rub   or gallop  Breast Exam:    Deferred to GYN  Abdomen:     Soft, non-tender, bowel sounds active all four quadrants,    no masses, no organomegaly  Genitalia:    Deferred to GYN  Rectal:    Extremities:   Extremities normal, atraumatic, no cyanosis or edema  Pulses:   2+ and symmetric all extremities  Skin:   Skin color,  texture, turgor normal, no rashes or lesions  Lymph nodes:   Cervical, supraclavicular, and axillary nodes normal  Neurologic:   CNII-XII intact, normal strength, sensation and reflexes    throughout          Assessment & Plan:

## 2017-06-06 ENCOUNTER — Telehealth: Payer: Self-pay | Admitting: Family Medicine

## 2017-06-06 NOTE — Telephone Encounter (Signed)
Pt call about labs results and had some questions, also pt asking if KT would call in something for motion sickness. Pt states that she will be going on a cruise and doesn't want to be sick

## 2017-06-07 ENCOUNTER — Other Ambulatory Visit: Payer: Self-pay

## 2017-06-07 DIAGNOSIS — T753XXA Motion sickness, initial encounter: Secondary | ICD-10-CM

## 2017-06-07 MED ORDER — SCOPOLAMINE 1 MG/3DAYS TD PT72: 1.0000 | MEDICATED_PATCH | TRANSDERMAL | 0 refills | Status: DC

## 2017-06-07 NOTE — Telephone Encounter (Signed)
LM with below advise from Dr. Birdie Riddle. [facility]

## 2017-06-07 NOTE — Telephone Encounter (Signed)
Ok for scopolamine patch for cruise There was no obvious cause of fatigue on labs so we may have to consider mood or stress as a cause of fatigue.  Recommend daily MVI, regular exercise, and healthy diet.

## 2017-06-07 NOTE — Telephone Encounter (Signed)
Spoke with patient she says that she received her lab results and did not understand. Everything looked good but she is so fatigued and can barely get out of bed in the mornings. Pt states that she drinks B12 drink in the morning, takes a multivitamin, and goes to bed early. What should she do?   Also pt would like something for motion sickness for her cruise in September.

## 2017-07-05 ENCOUNTER — Other Ambulatory Visit: Payer: Self-pay | Admitting: General Practice

## 2017-07-05 MED ORDER — ALPRAZOLAM 0.5 MG PO TABS: ORAL_TABLET | ORAL | 0 refills | Status: DC

## 2017-07-05 NOTE — Telephone Encounter (Signed)
Last OV 05/31/17 Alprazolam last filled 05/17/17 #120 with 0

## 2017-07-05 NOTE — Telephone Encounter (Signed)
Medication filled to pharmacy as requested.   

## 2017-07-30 ENCOUNTER — Other Ambulatory Visit: Payer: Self-pay | Admitting: Family Medicine

## 2017-08-30 ENCOUNTER — Other Ambulatory Visit: Payer: Self-pay | Admitting: Family Medicine

## 2017-09-02 ENCOUNTER — Other Ambulatory Visit: Payer: Self-pay | Admitting: General Practice

## 2017-09-02 MED ORDER — ALPRAZOLAM 0.5 MG PO TABS: ORAL_TABLET | ORAL | 0 refills | Status: DC

## 2017-09-02 NOTE — Telephone Encounter (Signed)
Medication filled to pharmacy as requested.   

## 2017-09-02 NOTE — Telephone Encounter (Signed)
Last OV 05/31/17 Alprazolam last filled 07/05/17 #120 with 0

## 2017-09-27 ENCOUNTER — Ambulatory Visit (INDEPENDENT_AMBULATORY_CARE_PROVIDER_SITE_OTHER): Payer: 59 | Admitting: Family Medicine

## 2017-09-27 ENCOUNTER — Encounter: Payer: Self-pay | Admitting: Family Medicine

## 2017-09-27 ENCOUNTER — Other Ambulatory Visit: Payer: Self-pay

## 2017-09-27 DIAGNOSIS — G43109 Migraine with aura, not intractable, without status migrainosus: Secondary | ICD-10-CM | POA: Diagnosis not present

## 2017-09-27 MED ORDER — SUMATRIPTAN SUCCINATE 50 MG PO TABS: 50.0000 mg | ORAL_TABLET | ORAL | 6 refills | Status: DC | PRN

## 2017-09-27 MED ORDER — PROMETHAZINE HCL 25 MG PO TABS: 25.0000 mg | ORAL_TABLET | Freq: Four times a day (QID) | ORAL | 0 refills | Status: DC | PRN

## 2017-09-27 NOTE — Assessment & Plan Note (Signed)
New.  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  She is currently asymptomatic but fearful that her headache will return.  Reviewed early interventions, lifestyle options.  Start Imitrex prn.  Phenergan.  Reviewed supportive care and red flags that should prompt return.  Pt expressed understanding and is in agreement w/ plan.

## 2017-09-27 NOTE — Progress Notes (Signed)
   Subjective:    Patient ID: Danielle Oneill, female    DOB: 1971-04-07, 46 y.o.   MRN: [id number]  HPI HA- pt has missed 3 days of work in the last 2 weeks 'b/c my head hurt so bad'.  Vomiting yesterday due to HA.  HA is R sided.  Pt reports HAs x1 yr and has been treating w/ massages and naproxen which worked until 2 weeks ago.  No relief w/ Vicodin prescribed by GYN.  Pt stopped hormones abruptly 2 weeks ago at recommendation of GYN and that is when intensity of HAs worsened.  + photophobia, no phonophobia.   Review of Systems For ROS see HPI     Objective:   Physical Exam  Constitutional: She is oriented to person, place, and time. She appears well-developed and well-nourished. No distress.  HENT:  Head: Normocephalic and atraumatic.  TMs WNL No TTP over sinuses Minimal nasal congestion  Eyes: Conjunctivae and EOM are normal. Pupils are equal, round, and reactive to light.  Neck: Normal range of motion. Neck supple.  Cardiovascular: Normal rate, regular rhythm, normal heart sounds and intact distal pulses.  Pulmonary/Chest: Effort normal and breath sounds normal. No respiratory distress. She has no wheezes. She has no rales.  Lymphadenopathy:    She has no cervical adenopathy.  Neurological: She is alert and oriented to person, place, and time. She has normal reflexes. No cranial nerve deficit. Coordination normal.  Psychiatric: She has a normal mood and affect. Her behavior is normal. Judgment and thought content normal.  Vitals reviewed.         Assessment & Plan:

## 2017-09-27 NOTE — Patient Instructions (Signed)
Follow up as needed or as scheduled At the first sign of a headache, take 2 Excedrin Migraine and drink plenty of fluids If no improvement in headache after 20-30 minutes, take an Imitrex (Sumatriptan) If nausea, take a phenergan and lie down to sleep it off Drink LOTS of water- dehydration is a headache trigger Call with any questions or concerns This should improve as your body re-regulates its hormone levels Hang in there!!!

## 2017-10-25 ENCOUNTER — Telehealth: Payer: Self-pay | Admitting: Family Medicine

## 2017-10-25 MED ORDER — ALPRAZOLAM 0.5 MG PO TABS: ORAL_TABLET | ORAL | 0 refills | Status: DC

## 2017-10-25 NOTE — Telephone Encounter (Signed)
Medication filled to pharmacy as requested.   

## 2017-10-25 NOTE — Telephone Encounter (Signed)
Alprazolam last filled 09/02/17 #120 with 0

## 2017-10-25 NOTE — Telephone Encounter (Signed)
Copied from [place] [phone]. Topic: Quick Communication - See Telephone Encounter >> Oct 25, 2017 10:42 AM Aurelio Brash B wrote: CRM for notification. See Telephone encounter for:  Refill  alpazolam  .5 mg   [facility]  10/25/17.

## 2017-10-25 NOTE — Telephone Encounter (Signed)
[place] for #120, no refills to local pharmacy

## 2017-10-25 NOTE — Telephone Encounter (Signed)
Requesting Alprazolam refill. Last visit 09/24/17. Thanks.

## 2017-11-08 ENCOUNTER — Other Ambulatory Visit: Payer: Self-pay | Admitting: Family Medicine

## 2017-11-08 ENCOUNTER — Telehealth: Payer: Self-pay | Admitting: Family Medicine

## 2017-11-08 MED ORDER — ESCITALOPRAM OXALATE 10 MG PO TABS: 10.0000 mg | ORAL_TABLET | Freq: Every day | ORAL | 0 refills | Status: DC

## 2017-11-08 NOTE — Telephone Encounter (Signed)
Copied from [place] [id number]. Topic: Quick Communication - See Telephone Encounter >> Nov 08, 2017 11:14 AM Conception Chancy, NT wrote: CRM for notification. See Telephone encounter for:  11/08/17.  Pt is calling and stating she is out of her lexapro and is wanting to know if anyway possible she could have 5 tablets [facility] until her [facility] scripts come in the mail.   Please advise. And contact patient if any questions

## 2017-11-08 NOTE — Telephone Encounter (Signed)
#  30 sent to local pharmacy and #90 sent to mail order today.

## 2017-11-08 NOTE — Telephone Encounter (Signed)
Please advise?   Last OV 09/27/17 (migraine) lexapro last filled 10/22/16 #90 with 1  Looks as though she would need a short term refill and then refill to mail order

## 2017-11-08 NOTE — Telephone Encounter (Signed)
Ok to send small Rx to local pharmacy and 90-day to mail-order. Further refills to come from PCP.

## 2017-11-14 ENCOUNTER — Other Ambulatory Visit: Payer: Self-pay | Admitting: Family Medicine

## 2017-11-14 NOTE — Telephone Encounter (Addendum)
RF request received from [facility] for Alprazolam.  Last OV 09/27/17 No upcoming OV Last RF 10/25/17 # 120, no RF  Please advise.

## 2017-11-15 NOTE — Telephone Encounter (Signed)
Patient states that she did not request this through [facility] so she is not sure why we have it. She states that she picked up her RX on 12/7 and she has plenty and does not need a refill.

## 2017-11-15 NOTE — Telephone Encounter (Signed)
This is a week early.  She can have a refill next week but she really should not be taking 4 alprazolam daily- this is an as needed medication

## 2017-11-18 ENCOUNTER — Telehealth: Payer: Self-pay | Admitting: Family Medicine

## 2017-11-18 NOTE — Telephone Encounter (Signed)
Copied from [place] [phone]. Topic: Quick Communication - See Telephone Encounter >> Nov 18, 2017 12:36 PM Synthia Innocent wrote: CRM for notification. See Telephone encounter for:  Requesting refill of QVar sent to [facility] in [facility] 11/18/17.

## 2017-11-20 MED ORDER — BECLOMETHASONE DIPROP HFA 40 MCG/ACT IN AERB: 1.0000 | INHALATION_SPRAY | Freq: Two times a day (BID) | RESPIRATORY_TRACT | 3 refills | Status: DC

## 2017-11-20 NOTE — Telephone Encounter (Signed)
Please advise, I do not see where qvar has been prescribed since 2016. [place] for refill?

## 2017-11-20 NOTE — Telephone Encounter (Signed)
Medication filled to pharmacy as requested.   

## 2017-11-20 NOTE — Addendum Note (Signed)
Addended by: Davis Gourd on: 11/20/2017 11:17 AM   Modules accepted: Orders

## 2017-11-20 NOTE — Telephone Encounter (Signed)
Ok to send 1 inhaler, no refills.  If symptoms worsen, she will need appt

## 2017-11-21 ENCOUNTER — Other Ambulatory Visit: Payer: Self-pay | Admitting: *Deleted

## 2017-11-21 ENCOUNTER — Telehealth: Payer: Self-pay | Admitting: Family Medicine

## 2017-11-21 MED ORDER — ALBUTEROL SULFATE HFA 108 (90 BASE) MCG/ACT IN AERS: INHALATION_SPRAY | RESPIRATORY_TRACT | 1 refills | Status: DC

## 2017-11-21 NOTE — Telephone Encounter (Signed)
Medication refilled (last visit 05/31/17)

## 2017-11-21 NOTE — Telephone Encounter (Signed)
Copied from [place] [id number]. Topic: Quick Communication - Rx Refill/Question >> Nov 21, 2017  3:44 PM Synthia Innocent wrote: Has the patient contacted their pharmacy? No, new pharmacy   (Agent: If no, request that the patient contact the pharmacy for the refill.)   Preferred Pharmacy (with phone number or street name): [facility], ph# [phone]   Agent: Please be advised that RX refills may take up to 3 business days. We ask that you follow-up with your pharmacy. Requesting refill on  PROAIR HFA 108 (90 Base) MCG/ACT inhaler  Wants to make [facility] as main pharmacy

## 2017-11-29 ENCOUNTER — Telehealth: Payer: Self-pay | Admitting: *Deleted

## 2017-11-29 MED ORDER — ALBUTEROL SULFATE HFA 108 (90 BASE) MCG/ACT IN AERS: INHALATION_SPRAY | RESPIRATORY_TRACT | 1 refills | Status: DC

## 2017-11-29 NOTE — Telephone Encounter (Signed)
Pharmacy has been corrected.   Copied from [place] [phone]. Topic: General - Other >> Nov 29, 2017  8:24 AM Carolyn Stare wrote:  The below med was sent to wrong pharmacy and need the med sent to [place]   . albuterol (PROAIR HFA) 108 (90 Base) MCG/ACT inhaler  [facility]    please add new pharmacy

## 2017-12-04 ENCOUNTER — Other Ambulatory Visit: Payer: Self-pay | Admitting: Family Medicine

## 2017-12-04 MED ORDER — ESCITALOPRAM OXALATE 10 MG PO TABS: 10.0000 mg | ORAL_TABLET | Freq: Every day | ORAL | 1 refills | Status: DC

## 2017-12-04 MED ORDER — SUMATRIPTAN SUCCINATE 50 MG PO TABS: 50.0000 mg | ORAL_TABLET | ORAL | 3 refills | Status: DC | PRN

## 2017-12-04 NOTE — Telephone Encounter (Signed)
Patient request Rx sent to [facility]

## 2017-12-04 NOTE — Telephone Encounter (Signed)
Pt informed

## 2017-12-04 NOTE — Telephone Encounter (Signed)
Copied from [place] [phone]. Topic: Quick Communication - See Telephone Encounter >> Dec 04, 2017 12:30 PM [name] wrote: CRM for notification. See Telephone encounter for:   Patient is needing prescription on escitalopram and Alaspam  SUMAtriptan (IMITREX) 50 MG tablet, and ALPRAZolam (XANAX) 0.5 MG , all her prescriptions have to [facility] to [facility] now.  90 days if possible and sent to [facility] to have ready so can have sent to her on time.     [place], [facility] [address] [place] [address] Phone: [phone] Fax: [phone]    12/04/17.

## 2017-12-04 NOTE — Telephone Encounter (Signed)
Last OV 09/27/17 Migraine Alprazolam last filled 10/25/17 #120 with 0 escitalopram last filled 11/08/17 #30 with 0 imitrex last filled 09/27/17 #10 with 6

## 2017-12-06 ENCOUNTER — Other Ambulatory Visit: Payer: Self-pay | Admitting: Physician Assistant

## 2017-12-06 ENCOUNTER — Telehealth: Payer: Self-pay | Admitting: Family Medicine

## 2017-12-06 MED ORDER — ALPRAZOLAM 0.5 MG PO TABS: ORAL_TABLET | ORAL | 0 refills | Status: DC

## 2017-12-06 NOTE — Telephone Encounter (Signed)
Please advise Last OV 09/27/17 Alprazolam last filled 10/25/17 #120 with 0

## 2017-12-06 NOTE — Telephone Encounter (Signed)
Will defer further refills of patient's medications to PCP  

## 2017-12-06 NOTE — Telephone Encounter (Signed)
Pt's Lexapro was sent to Mail Order on 1/16 and then local pharmacy today. Alprazolam sent to local pharmacy today

## 2017-12-06 NOTE — Telephone Encounter (Signed)
Medication filled to pharmacy as requested.  Pt was made aware. That lexapro was originally sent to mail order, was resent to local pharmacy in case mail order was slow to process. Alprazolam was sent to pharmacy this morning. Dr. Birdie Riddle will not send this to mail order due to quantity that pt takes.

## 2017-12-06 NOTE — Telephone Encounter (Signed)
Copied from [place] [phone]. Topic: Quick Communication - Rx Refill/Question >> Dec 06, 2017 10:48 AM Oliver Pila B wrote: Medication: ALPRAZolam Duanne Moron) 0.5 MG tablet [[id number]] and escitalopram (LEXAPRO) 10 MG tablet [[id number]]  Has the patient contacted their pharmacy? Yes.   (Agent: If no, request that the patient contact the pharmacy for the refill.) Preferred Pharmacy (with phone number or street name): [facility] Agent: Please be advised that RX refills may take up to 3 business days. We ask that you follow-up with your pharmacy.  Pt is highly upset, the Xanax has not been received [facility] which pt was [facility] yesterday it was ready, and she is still having an issue with her Lexapro which from her description may be an insurance issue but pt I wanting assistance to get this handled, pt would like a call back asap

## 2017-12-06 NOTE — Telephone Encounter (Signed)
Copied from [place] [phone]. Topic: Quick Communication - Rx Refill/Question >> Dec 06, 2017 10:51 AM Clack, Janett Billow D wrote: Medication: ALPRAZolam Duanne Moron) 0.5 MG tablet [[id number]]    Has the patient contacted their pharmacy? Yes.     (Agent: If no, request that the patient contact the pharmacy for the refill.)   Preferred Pharmacy (with phone number or street name): [facility] [place], [place] [address] [phone] (Phone) [phone] (Fax)  [facility]S states pt adv them she spoke to a nurse yday and was adv she will call her refill in to the [facility]. No notes in chart please f/u with pt if needed.   Agent: Please be advised that RX refills may take up to 3 business days. We ask that you follow-up with your pharmacy.

## 2017-12-06 NOTE — Telephone Encounter (Signed)
Nunzio Cory with [facility] states they recv'd the Lexapro for a refill but it needs to go to pt mail order on file.  [facility]

## 2017-12-07 LAB — HM MAMMOGRAPHY: HM Mammogram: NORMAL (ref 0–4)

## 2017-12-07 LAB — HM PAP SMEAR

## 2018-01-24 ENCOUNTER — Other Ambulatory Visit: Payer: Self-pay | Admitting: Family Medicine

## 2018-01-24 MED ORDER — ALPRAZOLAM 0.5 MG PO TABS: ORAL_TABLET | ORAL | 0 refills | Status: DC

## 2018-01-24 NOTE — Telephone Encounter (Signed)
Last refill: 12/06/17

## 2018-01-24 NOTE — Telephone Encounter (Signed)
Copied from [place] [id number]. Topic: Quick Communication - Rx Refill/Question >> Jan 24, 2018 12:11 PM Scherrie Gerlach wrote: Medication:  ALPRAZolam Duanne Moron) 0.5 MG tablet  Has the patient contacted their pharmacy? yes CVS told pt she HAS to contact her dr for this refill request.    [facility] [place], [place] - [address] [phone] (Phone) [phone] (Fax)

## 2018-01-24 NOTE — Telephone Encounter (Signed)
Last office visit 09/27/17 PCP:  Dr. Birdie Riddle [facility] [place]

## 2018-02-17 LAB — HM MAMMOGRAPHY: HM Mammogram: NORMAL (ref 0–4)

## 2018-03-10 ENCOUNTER — Other Ambulatory Visit: Payer: Self-pay | Admitting: Family Medicine

## 2018-03-10 NOTE — Telephone Encounter (Signed)
Last OV 09/27/17, Next OV 06/06/18  Last filled 01/24/18, #120 with 0 refills

## 2018-03-11 NOTE — Telephone Encounter (Signed)
LMOVM advising patient to call back concerning the Xanax. Since Dr Birdie Riddle is out of the office this week. Elyn Aquas can give a rx for #30 until Dr Birdie Riddle returns or if she can wait until Dr Birdie Riddle comes back for the full rx.

## 2018-03-11 NOTE — Telephone Encounter (Signed)
Please contact patient. Dr Birdie Riddle is out of office this week. I am happy to fill quantity 30 in her absence if she is ok with this. She can then request her regular amount once she has finished with this. She can also wait for Dr. Darene Lamer to return if she is adamant on getting 120 at a time.

## 2018-03-12 NOTE — Telephone Encounter (Signed)
Rx quantity 30 sent in. (partial fill in PCP absence)

## 2018-03-12 NOTE — Telephone Encounter (Signed)
PT is going on vacation  And needs to get filled now  [facility] [place] [place] - [address] [phone] (Phone) [phone] (Fax

## 2018-03-28 ENCOUNTER — Other Ambulatory Visit: Payer: Self-pay | Admitting: Family Medicine

## 2018-03-28 MED ORDER — ALPRAZOLAM 0.5 MG PO TABS: ORAL_TABLET | ORAL | 0 refills | Status: DC

## 2018-03-28 NOTE — Telephone Encounter (Signed)
Copied from [place] [id number]. Topic: Quick Communication - Rx Refill/Question >> Mar 28, 2018  9:25 AM Synthia Innocent wrote: Medication: ALPRAZolam Duanne Moron) 0.5 MG tablet  Has the patient contacted their pharmacy? Yes.  New pharmacy (Agent: If no, request that the patient contact the pharmacy for the refill.) Preferred Pharmacy (with phone number or street name): [facility] [address] in [place]: Please be advised that RX refills may take up to 3 business days. We ask that you follow-up with your pharmacy.

## 2018-03-28 NOTE — Telephone Encounter (Signed)
Request refill for xanax 0.5 MG Tab LR  03/12/18 for #30 tabs  Dr. Birdie Riddle  LOV  09/27/17 NOV  06/06/18  [place]

## 2018-04-24 ENCOUNTER — Other Ambulatory Visit: Payer: Self-pay | Admitting: Family Medicine

## 2018-05-05 ENCOUNTER — Other Ambulatory Visit: Payer: Self-pay | Admitting: Family Medicine

## 2018-05-05 MED ORDER — ALPRAZOLAM 0.5 MG PO TABS: ORAL_TABLET | ORAL | 0 refills | Status: DC

## 2018-05-05 NOTE — Telephone Encounter (Signed)
Xanax refill Last Refill:03/28/18 #120 Last OV: 05/31/17 PCP: Dr. Birdie Riddle Pharmacy:[facility]  [address]

## 2018-05-05 NOTE — Telephone Encounter (Signed)
Copied from [place] [id number]. Topic: Quick Communication - Rx Refill/Question >> May 05, 2018  9:51 AM Burchel, Abbi R wrote: Medication: ALPRAZolam Duanne Moron) 0.5 MG tablet Last filled 03/28/18  Has the patient contacted their pharmacy? No. (Agent: If no, request that the patient contact the pharmacy for the refill.) (Agent: If yes, when and what did the pharmacy advise?)  Preferred Pharmacy (with phone number or street name): [facility] [place], [address] [facility] [address] [phone] (Phone) [phone] (Fax)          Agent: Please be advised that RX refills may take up to 3 business days. We ask that you follow-up with your pharmacy.

## 2018-06-06 ENCOUNTER — Encounter: Payer: Self-pay | Admitting: Family Medicine

## 2018-06-06 ENCOUNTER — Other Ambulatory Visit: Payer: Self-pay

## 2018-06-06 ENCOUNTER — Ambulatory Visit (INDEPENDENT_AMBULATORY_CARE_PROVIDER_SITE_OTHER): Payer: 59 | Admitting: Family Medicine

## 2018-06-06 VITALS — BP 122/80 | HR 78 | Temp 98.1°F | Resp 16 | Ht 65.0 in | Wt 172.1 lb

## 2018-06-06 DIAGNOSIS — Z0000 Encounter for general adult medical examination without abnormal findings: Secondary | ICD-10-CM

## 2018-06-06 DIAGNOSIS — E663 Overweight: Secondary | ICD-10-CM

## 2018-06-06 DIAGNOSIS — E559 Vitamin D deficiency, unspecified: Secondary | ICD-10-CM | POA: Insufficient documentation

## 2018-06-06 LAB — CBC WITH DIFFERENTIAL/PLATELET
Basophils Absolute: 0.1 10*3/uL (ref 0.0–0.1)
Basophils Relative: 1 % (ref 0.0–3.0)
Eosinophils Absolute: 0.3 10*3/uL (ref 0.0–0.7)
Eosinophils Relative: 5.4 % — ABNORMAL HIGH (ref 0.0–5.0)
HCT: 40.6 % (ref 36.0–46.0)
Hemoglobin: 13.7 g/dL (ref 12.0–15.0)
Lymphocytes Relative: 31.3 % (ref 12.0–46.0)
Lymphs Abs: 2 10*3/uL (ref 0.7–4.0)
MCHC: 33.8 g/dL (ref 30.0–36.0)
MCV: 97.2 fl (ref 78.0–100.0)
Monocytes Absolute: 0.5 10*3/uL (ref 0.1–1.0)
Monocytes Relative: 7.2 % (ref 3.0–12.0)
Neutro Abs: 3.6 10*3/uL (ref 1.4–7.7)
Neutrophils Relative %: 55.1 % (ref 43.0–77.0)
Platelets: 309 10*3/uL (ref 150.0–400.0)
RBC: 4.18 Mil/uL (ref 3.87–5.11)
RDW: 13.7 % (ref 11.5–15.5)
WBC: 6.5 10*3/uL (ref 4.0–10.5)

## 2018-06-06 LAB — BASIC METABOLIC PANEL
BUN: 16 mg/dL (ref 6–23)
CO2: 31 mEq/L (ref 19–32)
Calcium: 9.5 mg/dL (ref 8.4–10.5)
Chloride: 99 mEq/L (ref 96–112)
Creatinine, Ser: 0.66 mg/dL (ref 0.40–1.20)
GFR: 101.72 mL/min (ref >60.00)
Glucose, Bld: 98 mg/dL (ref 70–99)
Potassium: 4.4 mEq/L (ref 3.5–5.1)
Sodium: 137 mEq/L (ref 135–145)

## 2018-06-06 LAB — HEPATIC FUNCTION PANEL
ALT: 18 U/L (ref 0–35)
AST: 17 U/L (ref 0–37)
Albumin: 4.7 g/dL (ref 3.5–5.2)
Alkaline Phosphatase: 48 U/L (ref 39–117)
Bilirubin, Direct: 0.1 mg/dL (ref 0.0–0.3)
Total Bilirubin: 0.7 mg/dL (ref 0.2–1.2)
Total Protein: 7.3 g/dL (ref 6.0–8.3)

## 2018-06-06 LAB — LIPID PANEL
Cholesterol: 251 mg/dL — ABNORMAL HIGH (ref 0–200)
HDL: 81.7 mg/dL (ref >39.00)
LDL Cholesterol: 147 mg/dL — ABNORMAL HIGH (ref 0–99)
NonHDL: 169.26
Total CHOL/HDL Ratio: 3
Triglycerides: 111 mg/dL (ref 0.0–149.0)
VLDL: 22.2 mg/dL (ref 0.0–40.0)

## 2018-06-06 LAB — TSH: TSH: 1.21 u[IU]/mL (ref 0.35–4.50)

## 2018-06-06 LAB — VITAMIN D 25 HYDROXY (VIT D DEFICIENCY, FRACTURES): VITD: 54.48 ng/mL (ref 30.00–100.00)

## 2018-06-06 NOTE — Assessment & Plan Note (Signed)
Pt has hx of this.  Check labs and replete prn. 

## 2018-06-06 NOTE — Progress Notes (Signed)
   Subjective:    Patient ID: Danielle Oneill, female    DOB: 02-23-1971, 47 y.o.   MRN: [id number]  HPI CPE- UTD on pap/mammo.  Exercising 4x/week.  Doesn't eat junk food or drink sodas, doesn't eat pasta.   Review of Systems Patient reports no vision/ hearing changes, adenopathy,fever, weight change,  persistant/recurrent hoarseness , swallowing issues, chest pain, palpitations, edema, persistant/recurrent cough, hemoptysis, dyspnea (rest/exertional/paroxysmal nocturnal), gastrointestinal bleeding (melena, rectal bleeding), abdominal pain, significant heartburn, bowel changes, GU symptoms (dysuria, hematuria, incontinence), Gyn symptoms (abnormal  bleeding, pain),  syncope, focal weakness, memory loss, numbness & tingling, skin/hair/nail changes, abnormal bruising or bleeding, anxiety, or depression.   + fatigue    Objective:   Physical Exam General Appearance:    Alert, cooperative, no distress, appears stated age  Head:    Normocephalic, without obvious abnormality, atraumatic  Eyes:    PERRL, conjunctiva/corneas clear, EOM's intact, fundi    benign, both eyes  Ears:    Normal TM's and external ear canals, both ears  Nose:   Nares normal, septum midline, mucosa normal, no drainage    or sinus tenderness  Throat:   Lips, mucosa, and tongue normal; teeth and gums normal  Neck:   Supple, symmetrical, trachea midline, no adenopathy;    Thyroid: no enlargement/tenderness/nodules  Back:     Symmetric, no curvature, ROM normal, no CVA tenderness  Lungs:     Clear to auscultation bilaterally, respirations unlabored  Chest Wall:    No tenderness or deformity   Heart:    Regular rate and rhythm, S1 and S2 normal, no murmur, rub   or gallop  Breast Exam:    Deferred to GYN  Abdomen:     Soft, non-tender, bowel sounds active all four quadrants,    no masses, no organomegaly  Genitalia:    Deferred to GYN  Rectal:    Extremities:   Extremities normal, atraumatic, no cyanosis or edema  Pulses:    2+ and symmetric all extremities  Skin:   Skin color, texture, turgor normal, no rashes or lesions  Lymph nodes:   Cervical, supraclavicular, and axillary nodes normal  Neurologic:   CNII-XII intact, normal strength, sensation and reflexes    throughout          Assessment & Plan:

## 2018-06-06 NOTE — Assessment & Plan Note (Signed)
Pt's PE WNL.  UTD on GYN.  Immunizations.  Check labs.  Anticipatory guidance provided.

## 2018-06-06 NOTE — Patient Instructions (Addendum)
Follow up in 1 year or as needed We'll notify you of your lab results and make any changes if needed Continue to work on healthy diet and regular exercise- you look great! Call with any questions or concerns Have a great summer!! 

## 2018-06-09 ENCOUNTER — Encounter: Payer: Self-pay | Admitting: General Practice

## 2018-06-18 ENCOUNTER — Other Ambulatory Visit: Payer: Self-pay

## 2018-06-18 ENCOUNTER — Encounter: Payer: Self-pay | Admitting: Emergency Medicine

## 2018-06-18 ENCOUNTER — Emergency Department (INDEPENDENT_AMBULATORY_CARE_PROVIDER_SITE_OTHER)
Admission: EM | Admit: 2018-06-18 | Discharge: 2018-06-18 | Disposition: A | Discharge status: Home / Self Care | Payer: Self-pay | Source: Home / Self Care | Attending: Family Medicine | Admitting: Family Medicine

## 2018-06-18 ENCOUNTER — Telehealth: Payer: Self-pay | Admitting: General Practice

## 2018-06-18 ENCOUNTER — Emergency Department (INDEPENDENT_AMBULATORY_CARE_PROVIDER_SITE_OTHER): Payer: 59

## 2018-06-18 DIAGNOSIS — M542 Cervicalgia: Secondary | ICD-10-CM | POA: Diagnosis not present

## 2018-06-18 DIAGNOSIS — S161XXA Strain of muscle, fascia and tendon at neck level, initial encounter: Secondary | ICD-10-CM
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
## 2018-06-18 NOTE — Telephone Encounter (Signed)
Called pt and LM for her to call me back if she has concerns she would like to discuss.

## 2018-06-18 NOTE — Telephone Encounter (Signed)
Will not order imaging without assessing patient.
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
## 2018-06-18 NOTE — Discharge Instructions (Signed)
°  You may try alternating cool and warm compresses and taking Tylenol and Advil as needed for pain.  Please follow up with family medicine next week if not improving.

## 2018-06-27 ENCOUNTER — Ambulatory Visit: Payer: 59 | Admitting: Family Medicine

## 2018-07-08 ENCOUNTER — Telehealth: Payer: Self-pay | Admitting: Family Medicine

## 2018-07-08 NOTE — Telephone Encounter (Signed)
Copied from [place] [id number]. Topic: Quick Communication - See Telephone Encounter >> Jul 08, 2018  4:56 PM Vernona Rieger wrote: CRM for notification. See Telephone encounter for: 07/08/18.  Patient said that [facility] has reached out twice for her medication and they are not getting a response.  ALPRAZolam (XANAX) 0.5 MG tablet She has to use [facility] now and not [facility]

## 2018-07-09 MED ORDER — ALPRAZOLAM 0.5 MG PO TABS: ORAL_TABLET | ORAL | 0 refills | Status: DC

## 2018-07-09 NOTE — Telephone Encounter (Signed)
Last OV 06/06/18, Next OV 06/12/2019  Last OV 05/05/18, # 120 with 0 refills  Pt requests 90 day Rx to [facility]

## 2018-07-09 NOTE — Telephone Encounter (Signed)
Prescription sent to [facility] 

## 2018-07-15 ENCOUNTER — Other Ambulatory Visit: Payer: Self-pay | Admitting: General Practice

## 2018-07-15 NOTE — Telephone Encounter (Signed)
Last OV 06/06/18 Alprazolam last filled 07/09/18 #120 with 0

## 2018-07-22 ENCOUNTER — Emergency Department (INDEPENDENT_AMBULATORY_CARE_PROVIDER_SITE_OTHER)
Admission: EM | Admit: 2018-07-22 | Discharge: 2018-07-22 | Disposition: A | Discharge status: Home / Self Care | Payer: 59 | Source: Home / Self Care | Attending: Family Medicine | Admitting: Family Medicine

## 2018-07-22 ENCOUNTER — Emergency Department (INDEPENDENT_AMBULATORY_CARE_PROVIDER_SITE_OTHER): Payer: 59

## 2018-07-22 ENCOUNTER — Other Ambulatory Visit: Payer: Self-pay

## 2018-07-22 DIAGNOSIS — J209 Acute bronchitis, unspecified: Secondary | ICD-10-CM | POA: Diagnosis not present

## 2018-07-22 DIAGNOSIS — R52 Pain, unspecified: Secondary | ICD-10-CM

## 2018-07-22 DIAGNOSIS — R05 Cough: Secondary | ICD-10-CM | POA: Diagnosis not present

## 2018-07-22 DIAGNOSIS — R5383 Other fatigue: Secondary | ICD-10-CM | POA: Diagnosis not present

## 2018-07-22 LAB — POCT INFLUENZA A/B
Influenza A, POC: NEGATIVE
Influenza B, POC: NEGATIVE

## 2018-07-22 MED ORDER — PREDNISONE 20 MG PO TABS: ORAL_TABLET | ORAL | 0 refills | Status: DC

## 2018-07-22 MED ORDER — BENZONATATE 100 MG PO CAPS: 100.0000 mg | ORAL_CAPSULE | Freq: Three times a day (TID) | ORAL | 0 refills | Status: DC

## 2018-07-22 MED ORDER — AZITHROMYCIN 250 MG PO TABS: 250.0000 mg | ORAL_TABLET | Freq: Every day | ORAL | 0 refills | Status: DC

## 2018-07-22 NOTE — Discharge Instructions (Signed)
°  You may take 500mg  acetaminophen every 4-6 hours or in combination with ibuprofen 400-600mg  every 6-8 hours as needed for pain, inflammation, and fever.  Be sure to drink at least eight 8oz glasses of water to stay well hydrated and get at least 8 hours of sleep at night, preferably more while sick.   Please take antibiotics as prescribed and be sure to complete entire course even if you start to feel better to ensure infection does not come back.  Please follow up with family medicine in 5-7 days if not improving ,sooner if worsening.

## 2018-07-22 NOTE — ED Provider Notes (Signed)
[facility]    CSN: [id number] Arrival date & time: 07/22/18  1851     History   Chief Complaint Chief Complaint  Patient presents with  . Cough  . Headache  . Generalized Body Aches    HPI Danielle Oneill is a 47 y.o. female.   HPI Danielle Oneill is a 47 y.o. female presenting to UC with c/o 6 days of worsening body aches, fatigue, cough, mild congestion, and posterior headache. She has taken OTC cough/cold medication with mild relief. 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 She is also fatigued. No known sick contacts or recent travel. No hx of flu or pneumonia. She has not received flu vaccine this year, she usually gets it in October.    Past Medical History:  Diagnosis Date  . Anxiety   . Asthma   . Depression   . Urinary incontinence     Patient Active Problem List   Diagnosis Date Noted  . Vitamin D deficiency 06/06/2018  . Migraine headache with aura 09/27/2017  . Patellar tendonitis 08/21/2012  . Pre-syncope 06/26/2012  . General medical examination 11/05/2011  . DYSMENORRHEA 09/14/2009  . Insomnia 09/14/2009  . Generalized anxiety disorder 10/24/2007  . HERPES, GENITAL NOS 06/03/2007  . ASTHMA 06/03/2007    Past Surgical History:  Procedure Laterality Date  . OOPHORECTOMY  2009   left  . ORTHOPEDIC SURGERY     shattered both elbows and left wrist, age 53  . PELVIC FRACTURE SURGERY  05/2009   patient denies  . ROBOTIC ASSISTED SALPINGO OOPHERECTOMY Right 02/10/2016   Procedure: ROBOTIC ASSISTED RIGHT SALPINGO OOPHORECTOMY LYSIS OF ADHESIONS TREATMENT OF ENDOMETRIOSIS, MYOMECTOMY;  Surgeon: Princess Bruins, MD;  Location: [facility];  Service: Gynecology;  Laterality: Right;    OB History   None      Home Medications    Prior to Admission medications   Medication Sig Start Date End Date Taking? Authorizing Provider  acyclovir  (ZOVIRAX) 200 MG capsule TAKE 1 CAPSULE FOUR TIMES A DAY WITH OUTBREAK Patient not taking: Reported on 09/27/2017 09/02/17   Midge Minium, MD  albuterol (PROVENTIL HFA;VENTOLIN HFA) 108 (90 Base) MCG/ACT inhaler USE 2 INHALATIONS EVERY 4  HOURS AS NEEDED 04/25/18   Midge Minium, MD  ALPRAZolam Duanne Moron) 0.5 MG tablet TAKE 2 TABS BY MOUTH NIGHTLY AS NEEDED INSOMNIA AND 1-2 TABS AS NEEDED FOR ANXIETY DURING THE DAY 07/09/18   Midge Minium, MD  azithromycin (ZITHROMAX) 250 MG tablet Take 1 tablet (250 mg total) by mouth daily. Take first 2 tablets together, then 1 every day until finished. 07/22/18   Noe Gens, PA-C  benzonatate (TESSALON) 100 MG capsule Take 1-2 capsules (100-200 mg total) by mouth every 8 (eight) hours. 07/22/18   Noe Gens, PA-C  BIOTIN PO Take 1 tablet by mouth daily.    [provider]  Ergocalciferol (VITAMIN D2) 2000 units TABS Take 1 tablet by mouth daily.    [provider]  escitalopram (LEXAPRO) 10 MG tablet TAKE 1 TABLET BY MOUTH  DAILY 04/25/18   Midge Minium, MD  guaiFENesin-codeine 100-10 MG/5ML syrup Take 48mL by mouth at bedtime as needed for cough.  May repeat dose in 4 to 6 hours. 07/24/18   Kandra Nicolas, MD  Multiple Vitamin (MULTIVITAMIN) tablet Take 1 tablet by mouth daily.    [provider]  predniSONE (DELTASONE) 20 MG tablet 3 tabs po day one, then 2 po daily x 4 days 07/22/18   Noe Gens, PA-C    Family History Family History  Problem Relation Age of Onset  . Thyroid disease Mother   . Cancer Mother        thyroid  . Asthma Father     Social History Social History   Tobacco Use  . Smoking status: Never Smoker  . Smokeless tobacco: Never Used  Substance Use Topics  . Alcohol use: Yes    Comment: occ. wine  . Drug use: No     Allergies   Patient has no known allergies.   Review of Systems Review of Systems  Constitutional: Positive for fatigue. Negative for chills and fever.    HENT: Positive for congestion. Negative for ear pain, sore throat, trouble swallowing and voice change.   Respiratory: Positive for cough. Negative for shortness of breath.   Cardiovascular: Negative for chest pain and palpitations.  Gastrointestinal: Negative for abdominal pain, diarrhea, nausea and vomiting.  Musculoskeletal: Positive for arthralgias, back pain and myalgias.  Skin: Negative for rash.  Neurological: Positive for headaches. Negative for dizziness and light-headedness.     Physical Exam Triage Vital Signs ED Triage Vitals  Enc Vitals Group     BP 07/22/18 1929 136/88     Pulse Rate 07/22/18 1929 92     Resp --      Temp 07/22/18 1929 98.4 F (36.9 C)     Temp Source 07/22/18 1929 Oral     SpO2 07/22/18 1929 97 %     Weight 07/22/18 1930 174 lb (78.9 kg)     Height 07/22/18 1930 5\' 5"  (1.651 m)     Head Circumference --      Peak Flow --      Pain Score 07/22/18 1929 5     Pain Loc --      Pain Edu? --      Excl. in [place]? --    No data found.  Updated Vital Signs BP 136/88 (BP Location: Right Arm)   Pulse 92   Temp 98.4 F (36.9 C) (Oral)   Ht 5\' 5"  (1.651 m)   Wt 174 lb (78.9 kg)   LMP 01/30/2016   SpO2 97%   BMI 28.96 kg/m   Visual Acuity Right Eye Distance:   Left Eye Distance:   Bilateral Distance:    Right Eye Near:   Left Eye Near:    Bilateral Near:     Physical Exam  Constitutional: She is oriented to person, place, and time. She appears well-developed and well-nourished.  Non-toxic appearance. She does not appear ill. No distress.  HENT:  Head: Normocephalic and atraumatic.  Right Ear: Tympanic membrane normal.  Left Ear: Tympanic membrane normal.  Nose: Nose normal. Right sinus exhibits no maxillary sinus tenderness and no frontal sinus tenderness. Left sinus exhibits no maxillary sinus tenderness and no frontal sinus tenderness.  Mouth/Throat: Uvula is midline, oropharynx is clear and moist and mucous membranes are normal.  Eyes:  EOM are normal.  Neck: Normal range of motion. Neck supple.  Cardiovascular: Normal rate and regular rhythm.  Pulmonary/Chest: Effort normal. No stridor. No respiratory distress. She has wheezes (faint ).  Musculoskeletal: Normal range of motion.  Neurological: She is alert and oriented to person, place, and time.  Skin: Skin is warm and dry.  Psychiatric: She has a normal mood and affect. Her behavior is normal.  Nursing note and vitals reviewed.    UC Treatments / Results  Labs (all labs ordered are listed, but only abnormal results are displayed) Labs Reviewed  POCT INFLUENZA A/B    EKG None  Radiology CLINICAL DATA:  Cough congestion and fatigue  EXAM: CHEST - 2 VIEW  COMPARISON:  10/12/2008  FINDINGS: Streaky right lung base atelectasis or minimal infiltrate. No pleural effusion. Normal heart size. No pneumothorax.  IMPRESSION: Streaky right base atelectasis or minimal infiltrate.   Electronically Signed   By: Donavan Foil M.D.   On: 07/22/2018 20:36   Procedures Procedures (including critical care time)  Medications Ordered in UC Medications - No data to display  Initial Impression / Assessment and Plan / UC Course  I have reviewed the triage vital signs and the nursing notes.  Pertinent labs & imaging results that were available during my care of the patient were reviewed by me and considered in my medical decision making (see chart for details).     Will cover for bacterial cause of bronchitis, question early pneumonia. Home care instructions provided.  Final Clinical Impressions(s) / UC Diagnoses   Final diagnoses:  Generalized body aches  Acute bronchitis, unspecified organism     Discharge Instructions      You may take 500mg  acetaminophen every 4-6 hours or in combination with ibuprofen 400-600mg  every 6-8 hours as needed for pain, inflammation, and fever.  Be sure to drink at least eight 8oz glasses of water to stay well  hydrated and get at least 8 hours of sleep at night, preferably more while sick.   Please take antibiotics as prescribed and be sure to complete entire course even if you start to feel better to ensure infection does not come back.  Please follow up with family medicine in 5-7 days if not improving ,sooner if worsening.     ED Prescriptions    Medication Sig Dispense Auth. Provider   azithromycin (ZITHROMAX) 250 MG tablet Take 1 tablet (250 mg total) by mouth daily. Take first 2 tablets together, then 1 every day until finished. 6 tablet Gerarda Fraction, Bernard Slayden O, PA-C   benzonatate (TESSALON) 100 MG capsule Take 1-2 capsules (100-200 mg total) by mouth every 8 (eight) hours. 21 capsule Gerarda Fraction, Tanyla Stege O, PA-C   predniSONE (DELTASONE) 20 MG tablet 3 tabs po day one, then 2 po daily x 4 days 11 tablet Noe Gens, PA-C     Controlled Substance Prescriptions [place] Controlled Substance Registry consulted? Not Applicable   Tyrell Antonio 07/25/18 8250

## 2018-07-22 NOTE — ED Triage Notes (Signed)
Started Wednesday or Thursday last week with cough, body aches, headache on the back of the right side

## 2018-07-24 ENCOUNTER — Telehealth: Payer: Self-pay

## 2018-07-24 ENCOUNTER — Encounter: Payer: Self-pay | Admitting: *Deleted

## 2018-07-24 ENCOUNTER — Emergency Department (INDEPENDENT_AMBULATORY_CARE_PROVIDER_SITE_OTHER)
Admission: EM | Admit: 2018-07-24 | Discharge: 2018-07-24 | Disposition: A | Discharge status: Home / Self Care | Payer: 59 | Source: Home / Self Care | Attending: Family Medicine | Admitting: Family Medicine

## 2018-07-24 DIAGNOSIS — J209 Acute bronchitis, unspecified: Secondary | ICD-10-CM | POA: Diagnosis not present

## 2018-07-24 LAB — POCT CBC W AUTO DIFF (K'VILLE URGENT CARE)

## 2018-07-24 MED ORDER — METHYLPREDNISOLONE ACETATE 80 MG/ML IJ SUSP
80.0000 mg | Freq: Once | INTRAMUSCULAR | Status: AC
  Administered 2018-07-24: 80 mg via INTRAMUSCULAR

## 2018-07-24 MED ORDER — GUAIFENESIN-CODEINE 100-10 MG/5ML PO SOLN: ORAL | 0 refills | Status: DC

## 2018-07-24 NOTE — ED Triage Notes (Signed)
Seen here 2 days ago, returns as symptoms worse. C/o headache, cough, hoarseness, and fatigue.

## 2018-07-24 NOTE — Telephone Encounter (Signed)
Spoke with patient and told her that Dr Assunta Found advised for her to use her inhaler every 4-6 hours , as well as Mucinex and tessalon.  Pt stated that she has been doing those things.  She is planning to come back in to see MD.

## 2018-07-24 NOTE — Discharge Instructions (Addendum)
Continue azithromycin. Take plain guaifenesin (1200mg  extended release tabs such as Mucinex) twice daily, with plenty of water, for cough and congestion.  May add Pseudoephedrine (30mg , one or two every 4 to 6 hours) for sinus congestion if needed.  Get adequate rest.   May continue Aleve, 2 tabs every 12 hours with food for fever, headache, body aches, etc. Try warm salt water gargles for sore throat.  Stop all antihistamines for now, and other non-prescription cough/cold preparations.

## 2018-07-24 NOTE — Telephone Encounter (Signed)
Not feeling much better.  Still coughing, cough keeping her up at night.  Told pt that I will discuss with Dr Assunta Found if any changes in tx.

## 2018-07-24 NOTE — ED Provider Notes (Signed)
[facility]    CSN: [id number] Arrival date & time: 07/24/18  1017     History   Chief Complaint Chief Complaint  Patient presents with  . Headache  . Cough  . Fatigue    HPI Danielle Oneill is a 47 y.o. female.   Patient reports no improvement since her previous visit two days ago.  Her symptoms had started one week ago with sudden onset of myalgias, fatigue, hoarseness, scratchy throat, and right side headache but no sinus congestion.  A non-productive cough started the next day.  She reports that she has had persistent chills/sweats during the past week.  She reports that she did not take the prednisone that was prescribed two days ago, although she has been taking azithromycin.  Her cough is worse at night and keeps her awake. She has a past history of childhood asthma, now generally quiescent.  She has a family history of asthma (father and brother).  She states that she generally does not need any medication for asthma when she is well.  The history is provided by the patient.    Past Medical History:  Diagnosis Date  . Anxiety   . Asthma   . Depression   . Urinary incontinence     Patient Active Problem List   Diagnosis Date Noted  . Vitamin D deficiency 06/06/2018  . Migraine headache with aura 09/27/2017  . Patellar tendonitis 08/21/2012  . Pre-syncope 06/26/2012  . General medical examination 11/05/2011  . DYSMENORRHEA 09/14/2009  . Insomnia 09/14/2009  . Generalized anxiety disorder 10/24/2007  . HERPES, GENITAL NOS 06/03/2007  . ASTHMA 06/03/2007    Past Surgical History:  Procedure Laterality Date  . OOPHORECTOMY  2009   left  . ORTHOPEDIC SURGERY     shattered both elbows and left wrist, age 59  . PELVIC FRACTURE SURGERY  05/2009   patient denies  . ROBOTIC ASSISTED SALPINGO OOPHERECTOMY Right 02/10/2016   Procedure: ROBOTIC ASSISTED RIGHT SALPINGO OOPHORECTOMY LYSIS OF ADHESIONS TREATMENT OF ENDOMETRIOSIS, MYOMECTOMY;  Surgeon:  Princess Bruins, MD;  Location: [facility];  Service: Gynecology;  Laterality: Right;    OB History   None      Home Medications    Prior to Admission medications   Medication Sig Start Date End Date Taking? Authorizing Provider  acyclovir (ZOVIRAX) 200 MG capsule TAKE 1 CAPSULE FOUR TIMES A DAY WITH OUTBREAK Patient not taking: Reported on 09/27/2017 09/02/17   Midge Minium, MD  albuterol (PROVENTIL HFA;VENTOLIN HFA) 108 (90 Base) MCG/ACT inhaler USE 2 INHALATIONS EVERY 4  HOURS AS NEEDED 04/25/18   Midge Minium, MD  ALPRAZolam Duanne Moron) 0.5 MG tablet TAKE 2 TABS BY MOUTH NIGHTLY AS NEEDED INSOMNIA AND 1-2 TABS AS NEEDED FOR ANXIETY DURING THE DAY 07/09/18   Midge Minium, MD  azithromycin (ZITHROMAX) 250 MG tablet Take 1 tablet (250 mg total) by mouth daily. Take first 2 tablets together, then 1 every day until finished. 07/22/18   Noe Gens, PA-C  benzonatate (TESSALON) 100 MG capsule Take 1-2 capsules (100-200 mg total) by mouth every 8 (eight) hours. 07/22/18   Noe Gens, PA-C  BIOTIN PO Take 1 tablet by mouth daily.    [provider]  Ergocalciferol (VITAMIN D2) 2000 units TABS Take 1 tablet by mouth daily.    [provider]  escitalopram (LEXAPRO) 10 MG tablet TAKE 1 TABLET BY MOUTH  DAILY 04/25/18   Midge Minium, MD  guaiFENesin-codeine 100-10 MG/5ML syrup Take  25mL by mouth at bedtime as needed for cough.  May repeat dose in 4 to 6 hours. 07/24/18   Kandra Nicolas, MD  Multiple Vitamin (MULTIVITAMIN) tablet Take 1 tablet by mouth daily.    [provider]  predniSONE (DELTASONE) 20 MG tablet 3 tabs po day one, then 2 po daily x 4 days 07/22/18   Noe Gens, PA-C    Family History Family History  Problem Relation Age of Onset  . Thyroid disease Mother   . Cancer Mother        thyroid  . Asthma Father     Social History Social History   Tobacco Use  . Smoking status: Never Smoker  . Smokeless tobacco: Never Used    Substance Use Topics  . Alcohol use: Yes    Comment: occ. wine  . Drug use: No     Allergies   Patient has no known allergies.   Review of Systems Review of Systems + sore throat, resolved + cough No pleuritic pain No wheezing No nasal congestion ? post-nasal drainage No sinus pain/pressure No itchy/red eyes No earache No hemoptysis No SOB No fever, + chills/sweats No nausea No vomiting No abdominal pain No diarrhea No urinary symptoms No skin rash + fatigue + myalgias + headache    Physical Exam Triage Vital Signs ED Triage Vitals [07/24/18 1031]  Enc Vitals Group     BP 97/66     Pulse Rate 76     Resp 16     Temp 98.1 F (36.7 C)     Temp Source Oral     SpO2 95 %     Weight      Height 5\' 5"  (1.651 m)     Head Circumference      Peak Flow      Pain Score 0     Pain Loc      Pain Edu?      Excl. in [place]?    No data found.  Updated Vital Signs BP 97/66 (BP Location: Left Arm)   Pulse 76   Temp 98.1 F (36.7 C) (Oral)   Resp 16   Ht 5\' 5"  (1.651 m)   LMP 01/30/2016   SpO2 95%   BMI 28.96 kg/m   Visual Acuity Right Eye Distance:   Left Eye Distance:   Bilateral Distance:    Right Eye Near:   Left Eye Near:    Bilateral Near:     Physical Exam Nursing notes and Vital Signs reviewed. Appearance:  Patient appears stated age, and in no acute distress Eyes:  Pupils are equal, round, and reactive to light and accomodation.  Extraocular movement is intact.  Conjunctivae are not inflamed  Ears:  Canals normal.  Tympanic membranes normal.  Nose:  Congested turbinates.  No sinus tenderness. Marland Kitchen  Pharynx:  Normal Neck:  Supple.  Enlarged tender posterior/lateral nodes are palpated bilaterally. Lungs:  Faint rhonchi posterior upper bilateral chest but no wheezes.  Breath sounds are equal.  Moving air well. Heart:  Regular rate and rhythm without murmurs, rubs, or gallops.  Abdomen:  Nontender without masses or hepatosplenomegaly.  Bowel  sounds are present.  No CVA or flank tenderness.  Extremities:  No edema.  Skin:  No rash present.    UC Treatments / Results  Labs (all labs ordered are listed, but only abnormal results are displayed) Labs Reviewed  POCT CBC W AUTO DIFF ([place]):  WBC 5.7; LY 32.9; MO  12.4; GR 54.7; Hgb 13.5; Platelets 283     EKG None  Radiology Dg Chest 2 View  Result Date: 07/22/2018 CLINICAL DATA:  Cough congestion and fatigue EXAM: CHEST - 2 VIEW COMPARISON:  10/12/2008 FINDINGS: Streaky right lung base atelectasis or minimal infiltrate. No pleural effusion. Normal heart size. No pneumothorax. IMPRESSION: Streaky right base atelectasis or minimal infiltrate. Electronically Signed   By: Donavan Foil M.D.   On: 07/22/2018 20:36    Procedures Procedures (including critical care time)  Medications Ordered in UC Medications  methylPREDNISolone acetate (DEPO-MEDROL) injection 80 mg (has no administration in time range)    Initial Impression / Assessment and Plan / UC Course  I have reviewed the triage vital signs and the nursing notes.  Pertinent labs & imaging results that were available during my care of the patient were reviewed by me and considered in my medical decision making (see chart for details).    Normal WBC reassuring.  Suspect resolving URI. Patient reassured. Administered Depo Medrol 80mg  IM. Rx for Robitussin AC for night time cough.  Controlled Substance Prescriptions I have consulted the [place] Controlled Substances Registry for this patient, and feel the risk/benefit ratio today is favorable for proceeding with this prescription for a controlled substance.   Followup with Family Doctor if not improved in one week.    Final Clinical Impressions(s) / UC Diagnoses   Final diagnoses:  Acute bronchitis, unspecified organism     Discharge Instructions     Continue azithromycin. Take plain guaifenesin (1200mg  extended release tabs such as Mucinex) twice  daily, with plenty of water, for cough and congestion.  May add Pseudoephedrine (30mg , one or two every 4 to 6 hours) for sinus congestion if needed.  Get adequate rest.   May continue Aleve, 2 tabs every 12 hours with food for fever, headache, body aches, etc. Try warm salt water gargles for sore throat.  Stop all antihistamines for now, and other non-prescription cough/cold preparations.      ED Prescriptions    Medication Sig Dispense Auth. Provider   guaiFENesin-codeine 100-10 MG/5ML syrup Take 80mL by mouth at bedtime as needed for cough.  May repeat dose in 4 to 6 hours. 100 mL Kandra Nicolas, MD        Kandra Nicolas, MD 07/24/18 [phone]

## 2018-08-14 ENCOUNTER — Other Ambulatory Visit: Payer: Self-pay | Admitting: Family Medicine

## 2018-08-14 NOTE — Telephone Encounter (Signed)
Last OV 06/06/18 Alprazolam last filled 07/09/18 #120 with 0

## 2018-09-18 ENCOUNTER — Other Ambulatory Visit: Payer: Self-pay | Admitting: Family Medicine

## 2018-10-06 ENCOUNTER — Telehealth: Payer: Self-pay | Admitting: Family Medicine

## 2018-10-06 ENCOUNTER — Other Ambulatory Visit: Payer: Self-pay | Admitting: Family Medicine

## 2018-10-06 NOTE — Telephone Encounter (Signed)
Copied from [place] [id number]. Topic: General - Other >> Oct 06, 2018  5:29 PM Cecelia Byars, NT wrote: Reason for CRM: Patient states the pharmacy told her she needs to have name brand  albuterol (PROVENTIL HFA;VENTOLIN HFA) 108 (90 Base) MCG/ACT inhaler  instead of generic called in ,there is currently no cost for the name brand inhaler  per the pharmacy

## 2018-10-07 MED ORDER — ALBUTEROL SULFATE HFA 108 (90 BASE) MCG/ACT IN AERS: 1.0000 | INHALATION_SPRAY | Freq: Four times a day (QID) | RESPIRATORY_TRACT | 1 refills | Status: DC | PRN

## 2018-10-07 NOTE — Telephone Encounter (Signed)
Medication filled to pharmacy as requested.   

## 2018-10-07 NOTE — Telephone Encounter (Signed)
Last OV 06/06/18 Alprazolam last filled 08/14/18 #120 with 0

## 2019-01-13 ENCOUNTER — Other Ambulatory Visit: Payer: Self-pay | Admitting: General Practice

## 2019-01-13 MED ORDER — ALPRAZOLAM 0.5 MG PO TABS: ORAL_TABLET | ORAL | 1 refills | Status: DC

## 2019-01-13 NOTE — Telephone Encounter (Signed)
Last OV 06/06/18 Alprazolam last filled 10/07/18 #120 with 0

## 2019-01-13 NOTE — Telephone Encounter (Signed)
Prescription filled.

## 2019-01-13 NOTE — Telephone Encounter (Signed)
Pt asked if rx sent to [facility] can be cancelled and instead sent to local [facility]. [facility] will charge pt $65 and [facility] will be $4. Please advise.  [place], [place] [facility] [place] [phone] (Phone) [phone] (Fax)

## 2019-01-13 NOTE — Telephone Encounter (Signed)
Patient is requesting that Rx be redirected to local pharmacy. Sent for PCP review of request.

## 2019-01-21 ENCOUNTER — Ambulatory Visit: Payer: Self-pay

## 2019-01-21 NOTE — Telephone Encounter (Signed)
Pt. Returned call. Given information Mrs. Ellington had collected from [employer]. Verbalizes understanding.

## 2019-01-21 NOTE — Telephone Encounter (Signed)
Called pt   Reason for Disposition . Health Information question, no triage required and triager able to answer question  Answer Assessment - Initial Assessment Questions 1. REASON FOR CALL or QUESTION: "What is your reason for calling today?" or "How can I best help you?" or "What question do you have that I can help answer?"     Note left by triage: "Patient called requesting to speak with nurse concerning corona virus. Patient states that she has an upcoming trip to [facility] and is worried about potentially being exposed on flight. Patient is requesting a call back with information/practices to keep her safe. Please advise."  Called pt 3 times and on the last call back informed the pt to have some alcohol gel handy, bring some Lysol or other disinfectant wipes to wipe down her arm rest, tray table. Wash hands with soap and water frequently especially before eating. Pt advised to wipe down lavatory before use and to wash hands with soap and water very well.  Sources: Guidelines located in [place] virus protocol information and CDC.gov Pt advised to call back with any further questions.  Protocols used: INFORMATION ONLY CALL-A-AH

## 2019-03-07 ENCOUNTER — Other Ambulatory Visit: Payer: Self-pay | Admitting: Family Medicine

## 2019-03-20 ENCOUNTER — Other Ambulatory Visit: Payer: Self-pay

## 2019-03-20 ENCOUNTER — Telehealth: Payer: Self-pay | Admitting: Family Medicine

## 2019-03-20 ENCOUNTER — Encounter: Payer: Self-pay | Admitting: Family Medicine

## 2019-03-20 ENCOUNTER — Other Ambulatory Visit: Payer: Self-pay | Admitting: Family Medicine

## 2019-03-20 ENCOUNTER — Ambulatory Visit (INDEPENDENT_AMBULATORY_CARE_PROVIDER_SITE_OTHER): Payer: 59 | Admitting: Family Medicine

## 2019-03-20 DIAGNOSIS — F411 Generalized anxiety disorder: Secondary | ICD-10-CM

## 2019-03-20 DIAGNOSIS — F329 Major depressive disorder, single episode, unspecified: Secondary | ICD-10-CM

## 2019-03-20 DIAGNOSIS — F419 Anxiety disorder, unspecified: Secondary | ICD-10-CM | POA: Diagnosis not present

## 2019-03-20 MED ORDER — ALPRAZOLAM 0.5 MG PO TABS: ORAL_TABLET | ORAL | 1 refills | Status: DC

## 2019-03-20 MED ORDER — ESCITALOPRAM OXALATE 20 MG PO TABS: 20.0000 mg | ORAL_TABLET | Freq: Every day | ORAL | 1 refills | Status: DC

## 2019-03-20 MED ORDER — ALBUTEROL SULFATE HFA 108 (90 BASE) MCG/ACT IN AERS: 1.0000 | INHALATION_SPRAY | Freq: Four times a day (QID) | RESPIRATORY_TRACT | 1 refills | Status: DC | PRN

## 2019-03-20 MED ORDER — ACYCLOVIR 200 MG PO CAPS: ORAL_CAPSULE | ORAL | 2 refills | Status: DC

## 2019-03-20 NOTE — Telephone Encounter (Signed)
Sent to PCP for FYI. Will discuss with pt at her appt this afternoon.

## 2019-03-20 NOTE — Telephone Encounter (Signed)
Last OV 06/06/18 Alprazolam last filled 01/13/19 #120 with 1

## 2019-03-20 NOTE — Progress Notes (Signed)
I have discussed the procedure for the virtual visit with the patient who has given consent to proceed with assessment and treatment.   Melven Stockard L Koby Hartfield, CMA     

## 2019-03-20 NOTE — Telephone Encounter (Signed)
Pt called in stating that with her Xananx she would like it to go to the [facility] in [place]. She states that the office office will never leave the medication at the door they don't even knock. I did advise her to call the post office to make them aware of this. Pt can be reached at the home # and she has an appt this afternoon.

## 2019-03-20 NOTE — Progress Notes (Signed)
   Virtual Visit via Video   I connected with patient on 03/20/19 at  2:00 PM EDT by a video enabled telemedicine application and verified that I am speaking with the correct person using two identifiers.  Location patient: Home Location provider: Acup[facility]ist, Office Persons participating in the virtual visit: Patient, Provider, [place] (Jess B)  I discussed the limitations of evaluation and management by telemedicine and the availability of in person appointments. The patient expressed understanding and agreed to proceed.  Subjective:   HPI:   Anxiety- ongoing issue for pt.  On Lexapro 10mg  daily and Alprazolam PRN.  'I just don't feel like myself'.  Under a lot of stress w/ possibility of job loss.  Unable to go to gym.  Trying to walk but low motivation and energy.  sxs have been worse x1 month.  Is doing self care- good support system- but 'I can't get out of this funk'.    ROS:   See pertinent positives and negatives per HPI.  Patient Active Problem List   Diagnosis Date Noted  . Vitamin D deficiency 06/06/2018  . Migraine headache with aura 09/27/2017  . Patellar tendonitis 08/21/2012  . Pre-syncope 06/26/2012  . General medical examination 11/05/2011  . DYSMENORRHEA 09/14/2009  . Insomnia 09/14/2009  . Generalized anxiety disorder 10/24/2007  . HERPES, GENITAL NOS 06/03/2007  . ASTHMA 06/03/2007    Social History   Tobacco Use  . Smoking status: Never Smoker  . Smokeless tobacco: Never Used  Substance Use Topics  . Alcohol use: Yes    Comment: occ. wine    Current Outpatient Medications:  .  acyclovir (ZOVIRAX) 200 MG capsule, TAKE 1 CAPSULE BY MOUTH  FOUR TIMES A DAY WITH  OUTBREAK, Disp: 120 capsule, Rfl: 2 .  albuterol (PROVENTIL HFA;VENTOLIN HFA) 108 (90 Base) MCG/ACT inhaler, Inhale 1-2 puffs into the lungs every 6 (six) hours as needed for wheezing or shortness of breath., Disp: 25.5 g, Rfl: 1 .  ALPRAZolam (XANAX) 0.5 MG tablet, 2 tabs QHS prn  insomnia and 1-2 tabs during the day as needed for anxiety, Disp: 120 tablet, Rfl: 1 .  BIOTIN PO, Take 1 tablet by mouth daily., Disp: , Rfl:  .  Ergocalciferol (VITAMIN D2) 2000 units TABS, Take 1 tablet by mouth daily., Disp: , Rfl:  .  escitalopram (LEXAPRO) 10 MG tablet, TAKE 1 TABLET BY MOUTH  DAILY, Disp: 90 tablet, Rfl: 0 .  Multiple Vitamin (MULTIVITAMIN) tablet, Take 1 tablet by mouth daily., Disp: , Rfl:   No Known Allergies  Objective:   LMP 01/30/2016   AAOx3, NAD NCAT, EOMI No obvious CN deficits Coloring WNL Pt is able to speak clearly, coherently without shortness of breath or increased work of breathing.  Thought process is linear.  Mood is appropriate.   Assessment and Plan:   Anxiety/Depression- deteriorated w/ COVID crisis and possible loss of job.  Will increased Lexapro to 20mg  daily and monitor closely for improvement.  Encouraged ongoing self care.  Pt expressed understanding and is in agreement w/ plan.    Annye Asa, MD 03/20/2019

## 2019-06-12 ENCOUNTER — Encounter: Payer: Self-pay | Admitting: Family Medicine

## 2019-06-12 ENCOUNTER — Encounter: Payer: 59 | Admitting: Family Medicine

## 2019-06-12 ENCOUNTER — Ambulatory Visit (INDEPENDENT_AMBULATORY_CARE_PROVIDER_SITE_OTHER): Payer: 59 | Admitting: Family Medicine

## 2019-06-12 ENCOUNTER — Other Ambulatory Visit: Payer: Self-pay

## 2019-06-12 VITALS — BP 121/81 | HR 71 | Temp 97.9°F | Resp 16 | Ht 65.0 in | Wt 177.4 lb

## 2019-06-12 DIAGNOSIS — Z0000 Encounter for general adult medical examination without abnormal findings: Secondary | ICD-10-CM

## 2019-06-12 DIAGNOSIS — E559 Vitamin D deficiency, unspecified: Secondary | ICD-10-CM

## 2019-06-12 DIAGNOSIS — E663 Overweight: Secondary | ICD-10-CM

## 2019-06-12 LAB — VITAMIN D 25 HYDROXY (VIT D DEFICIENCY, FRACTURES): VITD: 55.06 ng/mL (ref 30.00–100.00)

## 2019-06-12 LAB — CBC WITH DIFFERENTIAL/PLATELET
Basophils Absolute: 0.1 10*3/uL (ref 0.0–0.1)
Basophils Relative: 1 % (ref 0.0–3.0)
Eosinophils Absolute: 0.4 10*3/uL (ref 0.0–0.7)
Eosinophils Relative: 6.4 % — ABNORMAL HIGH (ref 0.0–5.0)
HCT: 41.7 % (ref 36.0–46.0)
Hemoglobin: 13.7 g/dL (ref 12.0–15.0)
Lymphocytes Relative: 25.4 % (ref 12.0–46.0)
Lymphs Abs: 1.6 10*3/uL (ref 0.7–4.0)
MCHC: 32.9 g/dL (ref 30.0–36.0)
MCV: 100 fl (ref 78.0–100.0)
Monocytes Absolute: 0.5 10*3/uL (ref 0.1–1.0)
Monocytes Relative: 7.5 % (ref 3.0–12.0)
Neutro Abs: 3.7 10*3/uL (ref 1.4–7.7)
Neutrophils Relative %: 59.7 % (ref 43.0–77.0)
Platelets: 294 10*3/uL (ref 150.0–400.0)
RBC: 4.17 Mil/uL (ref 3.87–5.11)
RDW: 14.1 % (ref 11.5–15.5)
WBC: 6.2 10*3/uL (ref 4.0–10.5)

## 2019-06-12 LAB — BASIC METABOLIC PANEL
BUN: 18 mg/dL (ref 6–23)
CO2: 29 mEq/L (ref 19–32)
Calcium: 9.7 mg/dL (ref 8.4–10.5)
Chloride: 103 mEq/L (ref 96–112)
Creatinine, Ser: 0.67 mg/dL (ref 0.40–1.20)
GFR: 93.66 mL/min (ref >60.00)
Glucose, Bld: 98 mg/dL (ref 70–99)
Potassium: 4.5 mEq/L (ref 3.5–5.1)
Sodium: 139 mEq/L (ref 135–145)

## 2019-06-12 LAB — HEPATIC FUNCTION PANEL
ALT: 18 U/L (ref 0–35)
AST: 19 U/L (ref 0–37)
Albumin: 4.8 g/dL (ref 3.5–5.2)
Alkaline Phosphatase: 48 U/L (ref 39–117)
Bilirubin, Direct: 0.1 mg/dL (ref 0.0–0.3)
Total Bilirubin: 0.6 mg/dL (ref 0.2–1.2)
Total Protein: 6.9 g/dL (ref 6.0–8.3)

## 2019-06-12 LAB — LIPID PANEL
Cholesterol: 241 mg/dL — ABNORMAL HIGH (ref 0–200)
HDL: 87.2 mg/dL (ref >39.00)
LDL Cholesterol: 134 mg/dL — ABNORMAL HIGH (ref 0–99)
NonHDL: 153.86
Total CHOL/HDL Ratio: 3
Triglycerides: 97 mg/dL (ref 0.0–149.0)
VLDL: 19.4 mg/dL (ref 0.0–40.0)

## 2019-06-12 LAB — TSH: TSH: 1.2 u[IU]/mL (ref 0.35–4.50)

## 2019-06-12 MED ORDER — ESCITALOPRAM OXALATE 10 MG PO TABS: 10.0000 mg | ORAL_TABLET | Freq: Every day | ORAL | 1 refills | Status: DC

## 2019-06-12 NOTE — Assessment & Plan Note (Signed)
Pt has gained 5 lbs since last visit.  Stressed need for healthy diet and regular exercise.  Check labs to risk stratify.

## 2019-06-12 NOTE — Progress Notes (Signed)
   Subjective:    Patient ID: Danielle Oneill, female    DOB: [birth year], 48 y.o.   MRN: [id number]  HPI CPE- UTD on pap smear, mammogram.  Too young for colonoscopy.  UTD on immunizations.  Pt has gained 5 lbs.   Review of Systems Patient reports no vision/ hearing changes, adenopathy,fever, weight change,  persistant/recurrent hoarseness , swallowing issues, chest pain, palpitations, edema, persistant/recurrent cough, hemoptysis, dyspnea (rest/exertional/paroxysmal nocturnal), gastrointestinal bleeding (melena, rectal bleeding), abdominal pain, significant heartburn, bowel changes, GU symptoms (dysuria, hematuria, incontinence), Gyn symptoms (abnormal  bleeding, pain),  syncope, focal weakness, memory loss, numbness & tingling, skin/hair/nail changes, abnormal bruising or bleeding, anxiety, or depression.     Objective:   Physical Exam General Appearance:    Alert, cooperative, no distress, appears stated age  Head:    Normocephalic, without obvious abnormality, atraumatic  Eyes:    PERRL, conjunctiva/corneas clear, EOM's intact, fundi    benign, both eyes  Ears:    Normal TM's and external ear canals, both ears  Nose:   Nares normal, septum midline, mucosa normal, no drainage    or sinus tenderness  Throat:   Lips, mucosa, and tongue normal; teeth and gums normal  Neck:   Supple, symmetrical, trachea midline, no adenopathy;    Thyroid: no enlargement/tenderness/nodules  Back:     Symmetric, no curvature, ROM normal, no CVA tenderness  Lungs:     Clear to auscultation bilaterally, respirations unlabored  Chest Wall:    No tenderness or deformity   Heart:    Regular rate and rhythm, S1 and S2 normal, no murmur, rub   or gallop  Breast Exam:    Deferred to GYN  Abdomen:     Soft, non-tender, bowel sounds active all four quadrants,    no masses, no organomegaly  Genitalia:    Deferred to GYN  Rectal:    Extremities:   Extremities normal, atraumatic, no cyanosis or edema  Pulses:   2+  and symmetric all extremities  Skin:   Skin color, texture, turgor normal, no rashes or lesions  Lymph nodes:   Cervical, supraclavicular, and axillary nodes normal  Neurologic:   CNII-XII intact, normal strength, sensation and reflexes    throughout          Assessment & Plan:

## 2019-06-12 NOTE — Assessment & Plan Note (Signed)
Pt's PE WNL w/ exception of being overweight.  Has GYN appt later today.  UTD on immunizations.  Check labs.  Anticipatory guidance provided.

## 2019-06-12 NOTE — Patient Instructions (Signed)
Follow up in 1 year or as needed We'll notify you of your lab results and make any changes if needed Continue to work on healthy diet and regular exercise- you can do it! Decrease the Lexapro to 10mg  daily Call with any questions or concerns Stay Safe!!

## 2019-06-12 NOTE — Assessment & Plan Note (Signed)
Check labs and replete prn. 

## 2019-07-19 ENCOUNTER — Other Ambulatory Visit: Payer: Self-pay | Admitting: Family Medicine

## 2019-08-03 ENCOUNTER — Other Ambulatory Visit: Payer: Self-pay | Admitting: Family Medicine

## 2019-08-03 NOTE — Telephone Encounter (Signed)
Pt also needs a refill on the Escitalopram, she uses [facility] in [place]

## 2019-08-10 ENCOUNTER — Telehealth: Payer: Self-pay | Admitting: Family Medicine

## 2019-08-10 ENCOUNTER — Other Ambulatory Visit: Payer: Self-pay | Admitting: Family Medicine

## 2019-08-10 MED ORDER — ALPRAZOLAM 0.5 MG PO TABS: ORAL_TABLET | ORAL | 1 refills | Status: DC

## 2019-08-10 NOTE — Telephone Encounter (Signed)
Last OV 06/12/19 Alprazolam last filled 03/20/19 #120 with 1

## 2019-08-10 NOTE — Telephone Encounter (Signed)
Pt called in asking for a refill on the Xanax, [facility] [place]

## 2019-08-10 NOTE — Telephone Encounter (Signed)
Pt called in asking for a refill on the Xanax, pt uses [facility] in [place]

## 2019-08-11 NOTE — Telephone Encounter (Signed)
Can we call mail order and cancel prescription?  If this was already sent out, we can not send additional medication to local pharmacy.  If we can cancel, I am happy to send locally

## 2019-08-11 NOTE — Telephone Encounter (Signed)
Called and spoke with mail order pharmacy. This Rx was canceled. Please can you send it to the local pharmacy.

## 2019-08-11 NOTE — Telephone Encounter (Signed)
Rx was sent to Mail order pharmacy and not the local pharmacy.   Can this be canceled and resent to [facility].

## 2019-08-11 NOTE — Addendum Note (Signed)
Addended by: Desmond Dike L on: 08/11/2019 11:00 AM   Modules accepted: Orders

## 2019-08-12 MED ORDER — ALPRAZOLAM 0.5 MG PO TABS: ORAL_TABLET | ORAL | 1 refills | Status: DC

## 2019-10-05 ENCOUNTER — Other Ambulatory Visit: Payer: Self-pay | Admitting: Family Medicine

## 2019-10-05 MED ORDER — ALPRAZOLAM 0.5 MG PO TABS: ORAL_TABLET | ORAL | 0 refills | Status: DC

## 2019-10-05 NOTE — Telephone Encounter (Signed)
[facility] and had them [facility] Rx completely in the system. [facility] and [facility] were able to [facility] for [facility].

## 2019-10-05 NOTE — Telephone Encounter (Signed)
Pt called in asking for a new script of the X[facility]ax [facility] [place].  She doesn't recall [facility]. Pt can be reached at the home #

## 2019-10-05 NOTE — Telephone Encounter (Signed)
Last OV 06/12/19 Alprazolam last filled 08/12/19 #120 with 1

## 2019-10-05 NOTE — Telephone Encounter (Signed)
Pt wants this to be sent to [facility] not [facility].

## 2019-10-29 ENCOUNTER — Other Ambulatory Visit: Payer: Self-pay | Admitting: Family Medicine

## 2019-10-29 ENCOUNTER — Other Ambulatory Visit: Payer: Self-pay | Admitting: General Practice

## 2019-10-29 MED ORDER — FLUTICASONE-SALMETEROL 250-50 MCG/DOSE IN AEPB: 1.0000 | INHALATION_SPRAY | Freq: Two times a day (BID) | RESPIRATORY_TRACT | 2 refills | Status: DC

## 2019-10-29 MED ORDER — ALBUTEROL SULFATE HFA 108 (90 BASE) MCG/ACT IN AERS: INHALATION_SPRAY | RESPIRATORY_TRACT | 2 refills | Status: DC

## 2019-10-29 NOTE — Telephone Encounter (Signed)
I pended the ventolin. Please advise on the [place] it is not on the patients med list

## 2019-10-29 NOTE — Telephone Encounter (Signed)
Pt called in asking asking Ventolin and the Wx[place]. Pt uses [facility] in [place]

## 2019-10-29 NOTE — Telephone Encounter (Signed)
Danielle Oneill is not a medication and I don't want to guess what she is asking for.  We will need to call her back and have her spell medication

## 2019-10-29 NOTE — Telephone Encounter (Signed)
Called pt and LMOVM to inform that ventolin was filled but that we need to name of the second medication.

## 2019-11-23 ENCOUNTER — Other Ambulatory Visit: Payer: Self-pay | Admitting: Family Medicine

## 2019-11-23 MED ORDER — ESCITALOPRAM OXALATE 10 MG PO TABS: 10.0000 mg | ORAL_TABLET | Freq: Every day | ORAL | 1 refills | Status: DC

## 2019-11-23 MED ORDER — ALPRAZOLAM 0.5 MG PO TABS: ORAL_TABLET | ORAL | 0 refills | Status: DC

## 2019-11-23 NOTE — Telephone Encounter (Signed)
Last OV 06/12/19  Alprazolam last filled 10/05/19 #120 with 0  Lexapro was sent in.

## 2019-11-23 NOTE — Telephone Encounter (Signed)
Pt called in asking for a [facility]ill [facility] [place] and the Lexapro to be sent to the mailorder. Pt can be reached at the home #

## 2019-11-26 ENCOUNTER — Encounter: Payer: Self-pay | Admitting: Physician Assistant

## 2019-11-26 ENCOUNTER — Other Ambulatory Visit: Payer: Self-pay

## 2019-11-26 ENCOUNTER — Ambulatory Visit (INDEPENDENT_AMBULATORY_CARE_PROVIDER_SITE_OTHER): Payer: 59 | Admitting: Physician Assistant

## 2019-11-26 DIAGNOSIS — T783XXA Angioneurotic edema, initial encounter: Secondary | ICD-10-CM | POA: Diagnosis not present

## 2019-11-26 MED ORDER — PREDNISONE 10 MG PO TABS: ORAL_TABLET | ORAL | 0 refills | Status: AC

## 2019-11-26 NOTE — Progress Notes (Signed)
Virtual Visit via Video   I connected with patient on 11/26/19 at 11:00 AM EST by a video enabled telemedicine application and verified that I am speaking with the correct person using two identifiers.  Location patient: Home Location provider: [place], Office Persons participating in the virtual visit: Patient, Provider, [place] (Patina Moore)  I discussed the limitations of evaluation and management by telemedicine and the availability of in person appointments. The patient expressed understanding and agreed to proceed.  Subjective:   HPI:        Patient presents via [facility] today c/o itching and swelling of her eyelids after having lash extensions glued on at the salon. Noted itching starting a few hours later with subsequent swelling. Called the salon and they gave her instructions on how to remove the adhesive and lashes. Notes doing this. Swelling has remained of upper eye lid, orbit and lower eyelid. Denies rash. Denies drainage from the eye. Denies fever, chills, shortness of breath or tongue swelling. Has taken Zyrtec and applied cold compresses to the area.    ROS:   See pertinent positives and negatives per HPI.  Patient Active Problem List   Diagnosis Date Noted  . Overweight (BMI 25.0-29.9) 06/12/2019  . Vitamin D deficiency 06/06/2018  . Migraine headache with aura 09/27/2017  . Patellar tendonitis 08/21/2012  . Pre-syncope 06/26/2012  . General medical examination 11/05/2011  . DYSMENORRHEA 09/14/2009  . Insomnia 09/14/2009  . Generalized anxiety disorder 10/24/2007  . HERPES, GENITAL NOS 06/03/2007  . ASTHMA 06/03/2007    Social History   Tobacco Use  . Smoking status: Never Smoker  . Smokeless tobacco: Never Used  Substance Use Topics  . Alcohol use: Yes    Comment: occ. wine    Current Outpatient Medications:  .  acyclovir (ZOVIRAX) 200 MG capsule, TAKE 1 CAPSULE BY MOUTH  FOUR TIMES A DAY WITH  OUTBREAK, Disp: 120 capsule, Rfl: 2 .   albuterol (VENTOLIN HFA) 108 (90 Base) MCG/ACT inhaler, USE 1-2 PUFFS BY MOUTH  EVERY 6 (SIX) HOURS AS  NEEDED FOR WHEEZING OR  SHORTNESS OF BREATH., Disp: 36 g, Rfl: 2 .  ALPRAZolam (XANAX) 0.5 MG tablet, 2 tabs QHS prn insomnia and 1-2 tabs during the day as needed for anxiety, Disp: 120 tablet, Rfl: 0 .  BIOTIN PO, Take 1 tablet by mouth daily., Disp: , Rfl:  .  Ergocalciferol (VITAMIN D2) 2000 units TABS, Take 1 tablet by mouth daily., Disp: , Rfl:  .  escitalopram (LEXAPRO) 10 MG tablet, Take 1 tablet (10 mg total) by mouth daily., Disp: 90 tablet, Rfl: 1 .  Fluticasone-Salmeterol (WIXELA INHUB) 250-50 MCG/DOSE AEPB, Inhale 1 puff into the lungs 2 (two) times daily., Disp: 60 each, Rfl: 2 .  Multiple Vitamin (MULTIVITAMIN) tablet, Take 1 tablet by mouth daily., Disp: , Rfl:   No Known Allergies  Objective:   LMP 01/30/2016   Patient is well-developed, well-nourished in no acute distress.  Resting comfortably at home.  Head is normocephalic, atraumatic.  No labored breathing.  Speech is clear and coherent with logical contest.  Patient is alert and oriented at baseline.  Swelling of upper and lower eyelids noted bilaterally without noted rash. Patient able to fully open and close eyes on video exam.  Assessment and Plan:   1. Angioedema of eyelid Absence of SOB, tongue swelling. Trigger has been removed. Continue cold compresses. Start Benadryl Q4-6 hours. She is aware this may make her drowsy. Will begin Prednisone taper. Strict return and  ER precautions reviewed with patient.    Leeanne Rio, PA-C 11/26/2019

## 2019-11-26 NOTE — Progress Notes (Signed)
I have discussed the procedure for the virtual visit with the patient who has given consent to proceed with assessment and treatment.   Danielle Oneill, CMA     

## 2019-12-28 ENCOUNTER — Other Ambulatory Visit: Payer: Self-pay | Admitting: General Practice

## 2019-12-28 MED ORDER — FLUTICASONE-SALMETEROL 250-50 MCG/DOSE IN AEPB: 1.0000 | INHALATION_SPRAY | Freq: Two times a day (BID) | RESPIRATORY_TRACT | 2 refills | Status: DC

## 2020-01-19 ENCOUNTER — Other Ambulatory Visit: Payer: Self-pay | Admitting: Family Medicine

## 2020-01-19 MED ORDER — ALPRAZOLAM 0.5 MG PO TABS: ORAL_TABLET | ORAL | 0 refills | Status: DC

## 2020-01-19 NOTE — Telephone Encounter (Signed)
Pt called in asking for a refill on the xanax, [facility] [place]

## 2020-01-19 NOTE — Telephone Encounter (Signed)
Last OV 11/26/19 Alprazolam last filled 11/23/19 #120 with 0

## 2020-03-07 ENCOUNTER — Other Ambulatory Visit: Payer: Self-pay | Admitting: Family Medicine

## 2020-03-07 MED ORDER — ALPRAZOLAM 0.5 MG PO TABS: ORAL_TABLET | ORAL | 0 refills | Status: DC

## 2020-03-07 NOTE — Telephone Encounter (Signed)
  LAST APPOINTMENT DATE: 01/19/2020   NEXT APPOINTMENT DATE:@7 /30/2021  MEDICATION:ALPRAZolam (XANAX) 0.5 MG tablet  [id number] [place] [place] - [address]  **Let patient know to contact pharmacy at the end of the day to make sure medication is ready. **  ** Please notify patient to allow 48-72 hours to process**  **Encourage patient to contact the pharmacy for refills or they can request refills through [facility]**  CLINICAL FILLS OUT ALL BELOW:   LAST REFILL:  QTY:  REFILL DATE:    OTHER COMMENTS:    Okay for refill?  Please advise

## 2020-03-07 NOTE — Telephone Encounter (Signed)
Last OV 01/19/20 Alprazolam last filled 01/19/20 #120 with 0

## 2020-04-25 ENCOUNTER — Other Ambulatory Visit: Payer: Self-pay | Admitting: Family Medicine

## 2020-04-25 MED ORDER — ALPRAZOLAM 0.5 MG PO TABS: ORAL_TABLET | ORAL | 0 refills | Status: DC

## 2020-04-25 NOTE — Telephone Encounter (Signed)
Last OV 11/26/19 Alprazolam last filled 03/07/20 #120 with 0

## 2020-04-25 NOTE — Telephone Encounter (Signed)
Patient called wanting a refill of her alprazolam [facility] [address] in [place] - Please just leave her a message because she is at work and will not be able to get it till after 5:30pm.
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
## 2020-05-18 ENCOUNTER — Other Ambulatory Visit: Payer: Self-pay | Admitting: Family Medicine

## 2020-05-25 ENCOUNTER — Other Ambulatory Visit: Payer: Self-pay | Admitting: Family Medicine

## 2020-06-17 ENCOUNTER — Other Ambulatory Visit: Payer: Self-pay

## 2020-06-17 ENCOUNTER — Encounter: Payer: Self-pay | Admitting: Family Medicine

## 2020-06-17 ENCOUNTER — Ambulatory Visit (INDEPENDENT_AMBULATORY_CARE_PROVIDER_SITE_OTHER): Payer: 59 | Admitting: Family Medicine

## 2020-06-17 ENCOUNTER — Other Ambulatory Visit: Payer: Self-pay | Admitting: Family Medicine

## 2020-06-17 VITALS — BP 125/80 | HR 81 | Temp 97.8°F | Resp 16 | Ht 65.0 in | Wt 175.1 lb

## 2020-06-17 DIAGNOSIS — Z1211 Encounter for screening for malignant neoplasm of colon: Secondary | ICD-10-CM | POA: Diagnosis not present

## 2020-06-17 DIAGNOSIS — E663 Overweight: Secondary | ICD-10-CM | POA: Diagnosis not present

## 2020-06-17 DIAGNOSIS — Z0000 Encounter for general adult medical examination without abnormal findings: Secondary | ICD-10-CM

## 2020-06-17 DIAGNOSIS — E559 Vitamin D deficiency, unspecified: Secondary | ICD-10-CM | POA: Diagnosis not present

## 2020-06-17 LAB — HEPATIC FUNCTION PANEL
ALT: 18 U/L (ref 0–35)
AST: 19 U/L (ref 0–37)
Albumin: 4.6 g/dL (ref 3.5–5.2)
Alkaline Phosphatase: 48 U/L (ref 39–117)
Bilirubin, Direct: 0.1 mg/dL (ref 0.0–0.3)
Total Bilirubin: 0.6 mg/dL (ref 0.2–1.2)
Total Protein: 6.9 g/dL (ref 6.0–8.3)

## 2020-06-17 LAB — CBC WITH DIFFERENTIAL/PLATELET
Basophils Absolute: 0.1 10*3/uL (ref 0.0–0.1)
Basophils Relative: 1.2 % (ref 0.0–3.0)
Eosinophils Absolute: 0.7 10*3/uL (ref 0.0–0.7)
Eosinophils Relative: 10.7 % — ABNORMAL HIGH (ref 0.0–5.0)
HCT: 40.9 % (ref 36.0–46.0)
Hemoglobin: 13.8 g/dL (ref 12.0–15.0)
Lymphocytes Relative: 29.9 % (ref 12.0–46.0)
Lymphs Abs: 1.9 10*3/uL (ref 0.7–4.0)
MCHC: 33.7 g/dL (ref 30.0–36.0)
MCV: 98.7 fl (ref 78.0–100.0)
Monocytes Absolute: 0.5 10*3/uL (ref 0.1–1.0)
Monocytes Relative: 7.7 % (ref 3.0–12.0)
Neutro Abs: 3.2 10*3/uL (ref 1.4–7.7)
Neutrophils Relative %: 50.5 % (ref 43.0–77.0)
Platelets: 278 10*3/uL (ref 150.0–400.0)
RBC: 4.14 Mil/uL (ref 3.87–5.11)
RDW: 13.9 % (ref 11.5–15.5)
WBC: 6.4 10*3/uL (ref 4.0–10.5)

## 2020-06-17 LAB — BASIC METABOLIC PANEL
BUN: 17 mg/dL (ref 6–23)
CO2: 28 mEq/L (ref 19–32)
Calcium: 9.6 mg/dL (ref 8.4–10.5)
Chloride: 105 mEq/L (ref 96–112)
Creatinine, Ser: 0.71 mg/dL (ref 0.40–1.20)
GFR: 87.23 mL/min (ref >60.00)
Glucose, Bld: 94 mg/dL (ref 70–99)
Potassium: 4.7 mEq/L (ref 3.5–5.1)
Sodium: 140 mEq/L (ref 135–145)

## 2020-06-17 LAB — LIPID PANEL
Cholesterol: 257 mg/dL — ABNORMAL HIGH (ref 0–200)
HDL: 81.2 mg/dL (ref >39.00)
LDL Cholesterol: 158 mg/dL — ABNORMAL HIGH (ref 0–99)
NonHDL: 175.43
Total CHOL/HDL Ratio: 3
Triglycerides: 88 mg/dL (ref 0.0–149.0)
VLDL: 17.6 mg/dL (ref 0.0–40.0)

## 2020-06-17 LAB — VITAMIN D 25 HYDROXY (VIT D DEFICIENCY, FRACTURES): VITD: 61.04 ng/mL (ref 30.00–100.00)

## 2020-06-17 LAB — TSH: TSH: 1.53 u[IU]/mL (ref 0.35–4.50)

## 2020-06-17 MED ORDER — ALPRAZOLAM 0.5 MG PO TABS: ORAL_TABLET | ORAL | 0 refills | Status: DC

## 2020-06-17 MED ORDER — ESCITALOPRAM OXALATE 20 MG PO TABS
20.0000 mg | ORAL_TABLET | Freq: Every day | ORAL | 1 refills | Status: DC
Start: 2020-06-17 — End: 2020-11-14

## 2020-06-17 NOTE — Assessment & Plan Note (Signed)
Pt's PE WNL w/ exception of being overweight.  UTD on vaccines.  Referred for colonoscopy.  Check labs.  Anticipatory guidance provided.

## 2020-06-17 NOTE — Telephone Encounter (Signed)
Last OV today Alprazolam last filled 04/25/20 #120 with 0

## 2020-06-17 NOTE — Assessment & Plan Note (Signed)
Ongoing issue for pt.  Stressed need for healthy diet and regular exercise.  Check labs to risk stratify.  Will follow 

## 2020-06-17 NOTE — Telephone Encounter (Signed)
Patient called because her alazopram has not been called in yet.  Last visit - today -  [facility] [place].

## 2020-06-17 NOTE — Assessment & Plan Note (Signed)
Pt has hx of this.  Check labs and replete prn. 

## 2020-06-17 NOTE — Progress Notes (Signed)
   Subjective:    Patient ID: Danielle Oneill, female    DOB: August 27, 1971, 49 y.o.   MRN: [id number]  HPI CPE- UTD on pap.  Has mammo scheduled for today.  UTD on Tdap, COVID vaccines.  Due to start colonoscopy.  Reviewed past medical, surgical, family and social histories.   Patient Care Team    Relationship Specialty Notifications Start End  Midge Minium, MD PCP - General Family Medicine  04/02/11   Princess Bruins, MD Consulting Physician Obstetrics and Gynecology  05/20/15      Health Maintenance  Topic Date Due  . MAMMOGRAM  02/18/2019  . Hepatitis C Screening  06/17/2021 (Originally 17-Apr-1971)  . HIV Screening  06/17/2021 (Originally 09/21/1985)  . INFLUENZA VACCINE  06/19/2020  . PAP SMEAR-Modifier  12/07/2020  . TETANUS/TDAP  09/04/2021  . COVID-19 Vaccine  Completed      Review of Systems Patient reports no vision/ hearing changes, adenopathy,fever, weight change,  persistant/recurrent hoarseness , swallowing issues, chest pain, palpitations, edema, persistant/recurrent cough, hemoptysis, dyspnea (rest/exertional/paroxysmal nocturnal), gastrointestinal bleeding (melena, rectal bleeding), abdominal pain, significant heartburn, bowel changes, GU symptoms (dysuria, hematuria, incontinence), Gyn symptoms (abnormal  bleeding, pain),  syncope, focal weakness, memory loss, numbness & tingling, skin/hair/nail changes, abnormal bruising or bleeding, anxiety, or depression.   This visit occurred during the SARS-CoV-2 public health emergency.  Safety protocols were in place, including screening questions prior to the visit, additional usage of staff PPE, and extensive cleaning of exam room while observing appropriate contact time as indicated for disinfecting solutions.       Objective:   Physical Exam        Assessment & Plan:

## 2020-06-17 NOTE — Patient Instructions (Signed)
Follow up in 1 year or as needed We'll notify you of your lab results and make any changes if needed Continue to work on healthy diet and regular exercise- you can do it! We'll call you with your GI appt for the colonoscopy Call with any questions or concerns Have a great weekend!

## 2020-06-20 ENCOUNTER — Encounter: Payer: Self-pay | Admitting: General Practice

## 2020-07-20 ENCOUNTER — Telehealth: Payer: Self-pay | Admitting: Family Medicine

## 2020-07-20 NOTE — Telephone Encounter (Signed)
I'm sorry that she is upset, but BMI is not a judgement or subjective measure.  It is a Biomedical scientist.  And our in our current system, BMI > 25 is flagged in everyone's chart as a risk factor and we are supposed to address it.  Which is why I always encourage and we ask about diet and exercise.  There are certain lab tests that are run to screen for abnormalities associated w/ weight (cholesterol, elevated glucose, thyroid disorders, etc).  If we associated the lipid panel w/ your physical exam (as we used to be able to do) it would not be covered at all and you would have to pay the entire cost out of pocket.  We now have to have a medical diagnosis in order to get any lab work done.  And most people don't feel they have had a complete physical w/o blood work

## 2020-07-20 NOTE — Telephone Encounter (Signed)
Patient called because at her physical there was another blood test done - A lipid panel  (She was charged $12.50) [employer] said that it was run because Dr. Birdie Riddle was concerned about patient's weight.  Patient is upset because no one here told her that.  She assumed that she is in good health with no problems - She is going to call billing about the charge, but she would like someone to call her and leave a detailed message concerning her health and if there is a problem that caused Dr. Birdie Riddle to run this extra test.

## 2020-07-20 NOTE — Telephone Encounter (Signed)
Please advise 

## 2020-07-20 NOTE — Telephone Encounter (Signed)
Called and LMOVM reading provider note verbatim.

## 2020-08-08 ENCOUNTER — Other Ambulatory Visit: Payer: Self-pay | Admitting: Family Medicine

## 2020-08-08 NOTE — Telephone Encounter (Signed)
..  Medication Refills  Medication:   Alprazolam 0.5 mg [facility] [address]  - [place] ** Let patient know to contact pharmacy at the end of the day to make sure medication is ready.**  ** Please notify patient to allow 48-72 hours to process.**  ** Encourage patient to contact the pharmacy for refills or they can request refills through [facility]**  Clinical Fills out below:   Last refill:  QTY:  Refill Date:    Other Comments:   Okay for refill?  Please advise

## 2020-08-08 NOTE — Telephone Encounter (Signed)
Last OV 06/17/20 Alprazolam last filled 06/17/20 #120 with 0

## 2020-08-10 ENCOUNTER — Other Ambulatory Visit: Payer: Self-pay | Admitting: Family Medicine

## 2020-08-10 ENCOUNTER — Telehealth: Payer: Self-pay | Admitting: Family Medicine

## 2020-08-10 NOTE — Telephone Encounter (Signed)
Last OV 06/17/20 Alprazolam last filled 06/17/20 #120 with 0

## 2020-08-10 NOTE — Telephone Encounter (Signed)
Danielle Oneill is calling in returning [place] phone call.

## 2020-08-10 NOTE — Telephone Encounter (Signed)
Med was filled

## 2020-08-10 NOTE — Telephone Encounter (Signed)
Patient is calling in wanting to know why this was previously denied by Dr.Tabori.

## 2020-08-10 NOTE — Telephone Encounter (Signed)
duplicate

## 2020-08-10 NOTE — Telephone Encounter (Signed)
Pt would like to know the status of her alprazolam refill? Why it was denied earlier.

## 2020-10-06 ENCOUNTER — Telehealth: Payer: Self-pay | Admitting: Family Medicine

## 2020-10-06 NOTE — Telephone Encounter (Signed)
..  Medication Refills  Medication:  Alprazolam 25mg   [facility] [address] ** Let patient know to contact pharmacy at the end of the day to make sure medication is ready.**  ** Please notify patient to allow 48-72 hours to process.**  ** Encourage patient to contact the pharmacy for refills or they can request refills through [facility]**  Clinical Fills out below:   Last refill:  QTY:  Refill Date:    Other Comments:   Okay for refill?  Please advise.

## 2020-10-06 NOTE — Telephone Encounter (Signed)
Patient is requesting the following  Alprazolam 25mg   120 tabs 0 refills   Last refill 08/10/2020

## 2020-10-07 MED ORDER — ALPRAZOLAM 0.5 MG PO TABS: ORAL_TABLET | ORAL | 0 refills | Status: DC

## 2020-10-07 NOTE — Telephone Encounter (Signed)
Prescription filled at pt request °

## 2020-10-27 ENCOUNTER — Other Ambulatory Visit: Payer: Self-pay | Admitting: Family Medicine

## 2020-11-01 ENCOUNTER — Telehealth: Payer: Self-pay | Admitting: Family Medicine

## 2020-11-01 NOTE — Telephone Encounter (Signed)
Patient called and states that [facility] said that Dr. Birdie Riddle refused to refill her Lexapro.  She has not asked for a refill, but she said that she will need one eventually and wants to know what is going on.  Please advise

## 2020-11-02 NOTE — Telephone Encounter (Signed)
This was requested automatically by pharmacy once you get your last refill.  It was denied by one of our medical assistants b/c it appeared to be requested early.  There will be no problem filling this when needed

## 2020-11-03 NOTE — Telephone Encounter (Signed)
Called patient in reference to medication. Patient voiced understanding.

## 2020-11-12 ENCOUNTER — Other Ambulatory Visit: Payer: Self-pay | Admitting: Family Medicine

## 2020-11-13 ENCOUNTER — Other Ambulatory Visit: Payer: Self-pay | Admitting: Family Medicine

## 2020-11-28 ENCOUNTER — Encounter: Payer: Self-pay | Admitting: Gastroenterology

## 2020-11-30 ENCOUNTER — Telehealth: Payer: Self-pay | Admitting: Family Medicine

## 2020-11-30 ENCOUNTER — Telehealth: Payer: Self-pay

## 2020-11-30 NOTE — Telephone Encounter (Signed)
Medication Refills  Medication:  [place]   [facility] [address] [facility] [place]   ** Let patient know to contact pharmacy at the end of the day to make sure medication is ready.**  ** Please notify patient to allow 48-72 hours to process.**  ** Encourage patient to contact the pharmacy for refills or they can request refills through [facility]**  Clinical Fills out below:   Last refill:  QTY:  Refill Date:    Other Comments:  Patient only has 10 left and she is worried about running out before Dr. Birdie Riddle is back on Monday   Okay for refill?  Please advise.

## 2020-11-30 NOTE — Telephone Encounter (Signed)
Pt does not have said medication on med list in chart. Called and left message for patient to return call to office to clarify med.

## 2020-11-30 NOTE — Telephone Encounter (Signed)
Called and left a message regarding Rx refill. Pt is to return call to office to clarify the medication.

## 2020-11-30 NOTE — Telephone Encounter (Signed)
Pt called back stating that the medication is Alprazolam not Lorazapam.   Please advise and pt is almost out of the medication.

## 2020-12-01 ENCOUNTER — Other Ambulatory Visit: Payer: Self-pay

## 2020-12-01 ENCOUNTER — Other Ambulatory Visit: Payer: Self-pay | Admitting: Family Medicine

## 2020-12-01 ENCOUNTER — Telehealth: Payer: Self-pay

## 2020-12-01 NOTE — Telephone Encounter (Signed)
Requesting:Xanax 0.5mg  Contract: UDS: Last Visit:06/17/20 Next Visit:06/19/20 Last Refill:10/07/20  Please Advise

## 2020-12-01 NOTE — Telephone Encounter (Signed)
Called patient in regards to Rx of Xanax. Rx sent to Dr. Birdie Riddle for approval.

## 2020-12-02 NOTE — Telephone Encounter (Signed)
Requesting:Xanax 0.5mg  Contract:11/24/12 UDS: Last Visit:06/17/20 Next Visit:06/19/21 Last Refill:10/07/20  Please Advise

## 2020-12-05 NOTE — Telephone Encounter (Signed)
This was filled by Einar Pheasant on Friday in my absence

## 2021-01-04 ENCOUNTER — Encounter: Payer: 59 | Admitting: Gastroenterology

## 2021-01-30 ENCOUNTER — Other Ambulatory Visit: Payer: Self-pay | Admitting: Family Medicine

## 2021-02-03 ENCOUNTER — Telehealth: Payer: Self-pay | Admitting: Family Medicine

## 2021-02-03 MED ORDER — ALPRAZOLAM 0.5 MG PO TABS: ORAL_TABLET | ORAL | 0 refills | Status: DC

## 2021-02-03 NOTE — Telephone Encounter (Signed)
..  Medication Refills  Last OV:  Medication:  Generic for Xanax - alprazolam  [facility] [address] [facility] [place]  Let patient know to contact pharmacy at the end of the day to make sure medication is ready.   Please notify patient to allow 48-72 hours to process.  Encourage patient to contact the pharmacy for refills or they can request refills through [facility] out below:   Last refill:  QTY:  Refill Date:    Other Comments:   Okay for refill?  Please advise.

## 2021-02-03 NOTE — Telephone Encounter (Signed)
Requesting:Xanax 0.5mg  Contract: UDS: Last Visit:06/17/20 Next Visit:06/19/21 Last Refill:12/02/20 120 tabs 0 refills   Please Advise

## 2021-02-03 NOTE — Telephone Encounter (Signed)
Prescription sent at pt's request 

## 2021-04-04 ENCOUNTER — Other Ambulatory Visit: Payer: Self-pay | Admitting: Family Medicine

## 2021-04-04 MED ORDER — ALPRAZOLAM 0.5 MG PO TABS: ORAL_TABLET | ORAL | 0 refills | Status: DC

## 2021-04-04 NOTE — Telephone Encounter (Signed)
..  Medication Refills  Last OV:  Medication:  Alprazolam [facility]  Let patient know to contact pharmacy at the end of the day to make sure medication is ready.   Please notify patient to allow 48-72 hours to process.  Encourage patient to contact the pharmacy for refills or they can request refills through [facility] out below:   Last refill:  QTY:  Refill Date:    Other Comments:   Okay for refill?  Please advise.

## 2021-04-04 NOTE — Telephone Encounter (Signed)
Patient is requesting a refill of the following medications: Requested Prescriptions   Pending Prescriptions Disp Refills  . ALPRAZolam (XANAX) 0.5 MG tablet 120 tablet 0    Sig: Take 2 tabs QHS prn insomnia and 1 tab BID prn anxiety    Date of patient request: 04/04/2021 Last office visit: 06/17/20 Date of last refill: 02/03/21 Last refill amount: 120 Follow up time period per chart: 1 year

## 2021-04-06 ENCOUNTER — Telehealth: Payer: Self-pay | Admitting: Family Medicine

## 2021-04-06 ENCOUNTER — Other Ambulatory Visit: Payer: Self-pay

## 2021-04-06 MED ORDER — ALBUTEROL SULFATE HFA 108 (90 BASE) MCG/ACT IN AERS: INHALATION_SPRAY | RESPIRATORY_TRACT | 6 refills | Status: DC

## 2021-04-06 NOTE — Telephone Encounter (Signed)
Medication sent to pharmacy  

## 2021-04-06 NOTE — Telephone Encounter (Signed)
..  Medication Refills  Last OV:  Medication:  Albuterol inhalers  Pharmacy:  [facility] Let patient know to contact pharmacy at the end of the day to make sure medication is ready.   Please notify patient to allow 48-72 hours to process.  Encourage patient to contact the pharmacy for refills or they can request refills through [facility] out below:   Last refill:  QTY:  Refill Date:    Other Comments:   Okay for refill?  Please advise.

## 2021-04-13 ENCOUNTER — Telehealth: Payer: Self-pay

## 2021-04-13 ENCOUNTER — Other Ambulatory Visit: Payer: Self-pay

## 2021-04-13 NOTE — Telephone Encounter (Signed)
Spoke with [facility] regarding patient PA foe ventolin inhaler. Patient insurance will no longer cover med but will cover the generic for the pro air and proventil. Is it ok to fill one of these?

## 2021-04-13 NOTE — Progress Notes (Signed)
I'm ok switching to generic Proventil.  Please send new script

## 2021-04-14 ENCOUNTER — Other Ambulatory Visit: Payer: Self-pay

## 2021-04-14 MED ORDER — ALBUTEROL SULFATE HFA 108 (90 BASE) MCG/ACT IN AERS: 2.0000 | INHALATION_SPRAY | Freq: Four times a day (QID) | RESPIRATORY_TRACT | 1 refills | Status: DC | PRN

## 2021-04-14 NOTE — Progress Notes (Signed)
Prescription sent to pharmacy.

## 2021-04-26 ENCOUNTER — Ambulatory Visit (AMBULATORY_SURGERY_CENTER): Payer: 59 | Admitting: *Deleted

## 2021-04-26 ENCOUNTER — Other Ambulatory Visit: Payer: Self-pay

## 2021-04-26 VITALS — Ht 65.0 in | Wt 175.0 lb

## 2021-04-26 DIAGNOSIS — Z1211 Encounter for screening for malignant neoplasm of colon: Secondary | ICD-10-CM

## 2021-04-26 MED ORDER — SUPREP BOWEL PREP KIT 17.5-3.13-1.6 GM/177ML PO SOLN: 1.0000 | Freq: Once | ORAL | 0 refills | Status: AC

## 2021-04-26 NOTE — Progress Notes (Signed)
Pt verified name, DOB, address and insurance during PV today. Pt mailed instruction packet to included paper to complete and mail back to [facility] with addressed and stamped envelope, Emmi video, copy of consent form to read and not return, and instructions.  PV completed PV over the phone. Pt encouraged to call with questions or issues.  My Chart instructions to pt as well    No egg or soy allergy known to patient  No issues with past sedation with any surgeries or procedures Patient denies ever being told they had issues or difficulty with intubation  No FH of Malignant Hyperthermia No diet pills per patient No home 02 use per patient  No blood thinners per patient  Pt denies issues with constipation  No A fib or A flutter  EMMI video to pt or via [place] 19 guidelines implemented in [place] today with Pt and RN  Pt is fully vaccinated  for Covid   Due to the COVID-19 pandemic we are asking patients to follow certain guidelines.  Pt aware of COVID protocols and [facility] guidelines

## 2021-04-27 ENCOUNTER — Telehealth: Payer: Self-pay | Admitting: Family Medicine

## 2021-04-27 NOTE — Telephone Encounter (Signed)
Pt going on a trip on Saturday with a friend that tested positive for covid today. She states that she started feeling bad on Sunday. She states that she doesn't have a fever just some minor cold symptoms. She wanted to know if it would be ok to trip with her? She states that has has her covid shots and buster.   Please advise pt can be reached at the home #

## 2021-04-28 NOTE — Telephone Encounter (Signed)
If she tested positive for COVID on Thursday, I would strongly advise AGAINST traveling with her unless it has been 10 days.  While the friend's sxs may be mild, she is still infectious and we have no way of knowing how were symptoms would be if she got sick.

## 2021-04-28 NOTE — Telephone Encounter (Signed)
Called patient with pcp recommendations. Patient voiced understanding. 

## 2021-05-09 ENCOUNTER — Encounter: Payer: Self-pay | Admitting: Certified Registered Nurse Anesthetist

## 2021-05-10 ENCOUNTER — Other Ambulatory Visit: Payer: Self-pay

## 2021-05-10 ENCOUNTER — Encounter: Payer: Self-pay | Admitting: Gastroenterology

## 2021-05-10 ENCOUNTER — Ambulatory Visit (AMBULATORY_SURGERY_CENTER): Payer: 59 | Admitting: Gastroenterology

## 2021-05-10 VITALS — BP 115/84 | HR 79 | Temp 96.7°F | Resp 14 | Ht 65.0 in | Wt 175.0 lb

## 2021-05-10 DIAGNOSIS — Z1211 Encounter for screening for malignant neoplasm of colon: Secondary | ICD-10-CM

## 2021-05-10 DIAGNOSIS — D123 Benign neoplasm of transverse colon: Secondary | ICD-10-CM

## 2021-05-10 DIAGNOSIS — D122 Benign neoplasm of ascending colon: Secondary | ICD-10-CM

## 2021-05-10 MED ORDER — SODIUM CHLORIDE 0.9 % IV SOLN: 500.0000 mL | Freq: Once | INTRAVENOUS | Status: DC

## 2021-05-10 NOTE — Progress Notes (Signed)
Report given to PACU, vss 

## 2021-05-10 NOTE — Progress Notes (Signed)
Pt's states no medical or surgical changes since previsit or office visit. 

## 2021-05-10 NOTE — Progress Notes (Signed)
4859 Robinul 0.2 mg IV given due large amount of secretions upon assessment. Patient experiencing nausea and vomiting.  MD updated and Zofran 4 mg IV given, vss

## 2021-05-10 NOTE — Patient Instructions (Signed)
Discharge instructions given. Handouts on polyps and hemorrhoids. Resume previous medications. YOU HAD AN ENDOSCOPIC PROCEDURE TODAY AT THE  ENDOSCOPY CENTER:   Refer to the procedure report that was given to you for any specific questions about what was found during the examination.  If the procedure report does not answer your questions, please call your gastroenterologist to clarify.  If you requested that your care partner not be given the details of your procedure findings, then the procedure report has been included in a sealed envelope for you to review at your convenience later.  YOU SHOULD EXPECT: Some feelings of bloating in the abdomen. Passage of more gas than usual.  Walking can help get rid of the air that was put into your GI tract during the procedure and reduce the bloating. If you had a lower endoscopy (such as a colonoscopy or flexible sigmoidoscopy) you may notice spotting of blood in your stool or on the toilet paper. If you underwent a bowel prep for your procedure, you may not have a normal bowel movement for a few days.  Please Note:  You might notice some irritation and congestion in your nose or some drainage.  This is from the oxygen used during your procedure.  There is no need for concern and it should clear up in a day or so.  SYMPTOMS TO REPORT IMMEDIATELY:  Following lower endoscopy (colonoscopy or flexible sigmoidoscopy):  Excessive amounts of blood in the stool  Significant tenderness or worsening of abdominal pains  Swelling of the abdomen that is new, acute  Fever of 100F or higher   For urgent or emergent issues, a gastroenterologist can be reached at any hour by calling [phone]. Do not use MyChart messaging for urgent concerns.    DIET:  We do recommend a small meal at first, but then you may proceed to your regular diet.  Drink plenty of fluids but you should avoid alcoholic beverages for 24 hours.  ACTIVITY:  You should plan to take it  easy for the rest of today and you should NOT DRIVE or use heavy machinery until tomorrow (because of the sedation medicines used during the test).    FOLLOW UP: Our staff will call the number listed on your records 48-72 hours following your procedure to check on you and address any questions or concerns that you may have regarding the information given to you following your procedure. If we do not reach you, we will leave a message.  We will attempt to reach you two times.  During this call, we will ask if you have developed any symptoms of COVID 19. If you develop any symptoms (ie: fever, flu-like symptoms, shortness of breath, cough etc.) before then, please call [phone].  If you test positive for Covid 19 in the 2 weeks post procedure, please call and report this information to us.    If any biopsies were taken you will be contacted by phone or by letter within the next 1-3 weeks.  Please call us at [phone] if you have not heard about the biopsies in 3 weeks.    SIGNATURES/CONFIDENTIALITY: You and/or your care partner have signed paperwork which will be entered into your electronic medical record.  These signatures attest to the fact that that the information above on your After Visit Summary has been reviewed and is understood.  Full responsibility of the confidentiality of this discharge information lies with you and/or your care-partner.  

## 2021-05-10 NOTE — Op Note (Signed)
[facility] Patient Name: Danielle Oneill Procedure Date: 05/10/2021 7:14 AM MRN: [id number] Endoscopist: Mauri Pole , MD Age: 50 Referring MD:  Date of Birth: 1971/06/08 Gender: Female Account #: [id number] Procedure:                Colonoscopy Indications:              Screening for colorectal malignant neoplasm Medicines:                Monitored Anesthesia Care Procedure:                Pre-Anesthesia Assessment:                           - Prior to the procedure, a History and Physical                            was performed, and patient medications and                            allergies were reviewed. The patient's tolerance of                            previous anesthesia was also reviewed. The risks                            and benefits of the procedure and the sedation                            options and risks were discussed with the patient.                            All questions were answered, and informed consent                            was obtained. Prior Anticoagulants: The patient has                            taken no previous anticoagulant or antiplatelet                            agents. ASA Grade Assessment: II - A patient with                            mild systemic disease. After reviewing the risks                            and benefits, the patient was deemed in                            satisfactory condition to undergo the procedure.                           After obtaining informed consent, the colonoscope  was passed under direct vision. Throughout the                            procedure, the patient's blood pressure, pulse, and                            oxygen saturations were monitored continuously. The                            Olympus PCF-H190DL (#[id number]) Colonoscope was                            introduced through the anus and advanced to the the                            cecum,  identified by appendiceal orifice and                            ileocecal valve. The colonoscopy was performed                            without difficulty. The patient tolerated the                            procedure well. The quality of the bowel                            preparation was excellent. The ileocecal valve,                            appendiceal orifice, and rectum were photographed. Scope In: 8:18:15 AM Scope Out: 8:32:28 AM Scope Withdrawal Time: 0 hours 10 minutes 13 seconds  Total Procedure Duration: 0 hours 14 minutes 13 seconds  Findings:                 The perianal and digital rectal examinations were                            normal.                           A 1 mm polyp was found in the ascending colon. The                            polyp was sessile. The polyp was removed with a                            cold biopsy forceps. Resection and retrieval were                            complete.                           A 5 mm polyp was found in the transverse colon. The  polyp was sessile. The polyp was removed with a                            cold snare. Resection and retrieval were complete.                           Non-bleeding external and internal hemorrhoids were                            found during retroflexion. The hemorrhoids were                            medium-sized. Complications:            No immediate complications. Estimated Blood Loss:     Estimated blood loss was minimal. Impression:               - One 1 mm polyp in the ascending colon, removed                            with a cold biopsy forceps. Resected and retrieved.                           - One 5 mm polyp in the transverse colon, removed                            with a cold snare. Resected and retrieved.                           - Non-bleeding external and internal hemorrhoids. Recommendation:           - Patient has a contact number available  for                            emergencies. The signs and symptoms of potential                            delayed complications were discussed with the                            patient. Return to normal activities tomorrow.                            Written discharge instructions were provided to the                            patient.                           - Resume previous diet.                           - Continue present medications.                           - Await pathology results.                           -  Repeat colonoscopy in 5-10 years for surveillance                            based on pathology results. Mauri Pole, MD 05/10/2021 8:40:12 AM This report has been signed electronically.

## 2021-05-10 NOTE — Progress Notes (Signed)
Called to room to assist during endoscopic procedure.  Patient ID and intended procedure confirmed with present staff. Received instructions for my participation in the procedure from the performing physician.  

## 2021-05-12 ENCOUNTER — Telehealth: Payer: Self-pay

## 2021-05-12 NOTE — Telephone Encounter (Signed)
  Follow up Call-  Call back number 05/10/2021  Post procedure Call Back phone  # [phone] cell  Permission to leave phone message Yes  Some recent data might be hidden     Patient questions:  Do you have a fever, pain , or abdominal swelling? No. Pain Score  0 *  Have you tolerated food without any problems? Yes.    Have you been able to return to your normal activities? Yes.    Do you have any questions about your discharge instructions: Diet   No. Medications  No. Follow up visit  No.  Do you have questions or concerns about your Care? No.  Actions: * If pain score is 4 or above: No action needed, pain <4.  Have you developed a fever since your procedure? no  2.   Have you had an respiratory symptoms (SOB or cough) since your procedure? no  3.   Have you tested positive for COVID 19 since your procedure no  4.   Have you had any family members/close contacts diagnosed with the COVID 19 since your procedure?  no   If yes to any of these questions please route to Joylene John, RN and Joella Prince, RN

## 2021-05-22 ENCOUNTER — Encounter: Payer: Self-pay | Admitting: Gastroenterology

## 2021-05-25 ENCOUNTER — Telehealth: Payer: Self-pay

## 2021-05-25 MED ORDER — ALPRAZOLAM 0.5 MG PO TABS: ORAL_TABLET | ORAL | 0 refills | Status: DC

## 2021-05-25 NOTE — Telephone Encounter (Signed)
Prescription filled at pt's request ?

## 2021-05-25 NOTE — Telephone Encounter (Signed)
Pt needs refill on ALPRAZolam (XANAX) 0.5 MG tablet  pt is not out she is going be on vacation a week from tomorrow and wants to be proactive and call in to make sure she has meds before she leaves.[facility] [place] [place] [facility] [address]   Pt call [phone]

## 2021-06-19 ENCOUNTER — Encounter: Payer: 59 | Admitting: Family Medicine

## 2021-06-19 LAB — HM MAMMOGRAPHY

## 2021-06-20 ENCOUNTER — Telehealth: Payer: Self-pay

## 2021-06-20 NOTE — Telephone Encounter (Signed)
Pt said that she had a colonoscopy in June and was asked about a pelvic fracture. She has never fractured her pelvis and is wondering if her medical information is wrong?   Pt call back [phone]

## 2021-06-21 NOTE — Telephone Encounter (Signed)
Left a voicemail message informing patient that their was no record of a pelvic fracture found in her record.

## 2021-06-23 ENCOUNTER — Other Ambulatory Visit: Payer: Self-pay | Admitting: Family Medicine

## 2021-06-26 NOTE — Telephone Encounter (Signed)
LFD 01/31/21 #360 with no refills LOV 06/17/20 NOV 08/16/21

## 2021-06-27 ENCOUNTER — Other Ambulatory Visit: Payer: Self-pay | Admitting: Family Medicine

## 2021-07-10 LAB — RESULTS CONSOLE HPV: CHL HPV: NEGATIVE

## 2021-07-10 LAB — HM PAP SMEAR

## 2021-07-20 ENCOUNTER — Other Ambulatory Visit: Payer: Self-pay

## 2021-07-20 ENCOUNTER — Telehealth: Payer: Self-pay | Admitting: Family Medicine

## 2021-07-20 MED ORDER — ALPRAZOLAM 0.5 MG PO TABS: ORAL_TABLET | ORAL | 0 refills | Status: DC

## 2021-07-20 NOTE — Telephone Encounter (Signed)
Request sent to provider for approval.  

## 2021-07-20 NOTE — Telephone Encounter (Signed)
Patient needs her alprazolam sent to [facility] in [place].  LFD 05/25/21 #120 with no refills LOV 06/17/20 NOV 10/10/21

## 2021-07-20 NOTE — Telephone Encounter (Signed)
Patient needs her alprazolam sent to [facility] in [place].

## 2021-08-16 ENCOUNTER — Encounter: Payer: 59 | Admitting: Family Medicine

## 2021-08-30 ENCOUNTER — Other Ambulatory Visit: Payer: Self-pay | Admitting: Family Medicine

## 2021-09-01 ENCOUNTER — Telehealth: Payer: Self-pay

## 2021-09-01 NOTE — Telephone Encounter (Signed)
Caller name:Danielle Oneill   On DPR? :yes  Call back number:[phone]  Provider they see: Birdie Riddle   Reason for call: Pt called the pharmacy [facility] and they have informed her they have come out with a combined shot or booster with Covid and Flu and when Pt went to the OBGYN and they have never heard of it and the pt is wanting advice which booster to get or what to do before she goes to her pharmacy>

## 2021-09-01 NOTE — Telephone Encounter (Signed)
Called and spoke with patient and informed he that per you it is ok to get both vaccines but in different arms but have not heard of a combined vaccine with flu-covid. Patient understood. No further concerns at this time.

## 2021-09-01 NOTE — Telephone Encounter (Signed)
As far as I know, there is not a combined vaccine.  You can have both vaccines administered at the same time- typically in different arms- and many pharmacies are doing that.  But a combined vaccine would be news to me.

## 2021-09-14 ENCOUNTER — Other Ambulatory Visit: Payer: Self-pay

## 2021-09-14 ENCOUNTER — Telehealth: Payer: Self-pay | Admitting: Family Medicine

## 2021-09-14 MED ORDER — ALPRAZOLAM 0.5 MG PO TABS: ORAL_TABLET | ORAL | 3 refills | Status: DC

## 2021-09-14 NOTE — Telephone Encounter (Signed)
Message sent to provider.  Girlie Veltri,cma

## 2021-09-14 NOTE — Telephone Encounter (Signed)
Patient is requesting a refill on Xanax. Last OV:  06/17/2021 Future OV:  10/20/2021 with  NP Antonietta Breach

## 2021-09-14 NOTE — Telephone Encounter (Signed)
..  Caller name:  Dee Paden  Caller callback #: [phone] Encourage patient to contact the pharmacy for refills or they can request refills through [facility]  (Please schedule appointment if patient has not been seen in over a year)  MEDICATION NAME & DOSE:  Alazopram   Notes/Comments from patient:  Patient states that it is the same thing as xanax  WHAT PHARMACY WOULD THEY LIKE THIS SENT TO:   [facility] [place]  Please notify patient: It takes 48-72 hours to process rx refill requests Ask patient to call pharmacy to ensure rx is ready before heading there.   (CLINICAL TO FILL OR ROUTE PER PROTOCOLS)

## 2021-09-17 ENCOUNTER — Other Ambulatory Visit: Payer: Self-pay | Admitting: Family Medicine

## 2021-10-10 ENCOUNTER — Encounter: Payer: 59 | Admitting: Family Medicine

## 2021-10-20 ENCOUNTER — Encounter: Payer: Self-pay | Admitting: Registered Nurse

## 2021-10-20 ENCOUNTER — Other Ambulatory Visit: Payer: Self-pay

## 2021-10-20 ENCOUNTER — Ambulatory Visit (INDEPENDENT_AMBULATORY_CARE_PROVIDER_SITE_OTHER): Payer: 59 | Admitting: Registered Nurse

## 2021-10-20 VITALS — BP 110/78 | HR 85 | Temp 98.2°F | Resp 18 | Ht 65.0 in

## 2021-10-20 DIAGNOSIS — Z1329 Encounter for screening for other suspected endocrine disorder: Secondary | ICD-10-CM | POA: Diagnosis not present

## 2021-10-20 DIAGNOSIS — Z13220 Encounter for screening for lipoid disorders: Secondary | ICD-10-CM

## 2021-10-20 DIAGNOSIS — Z130 Encounter for screening for diseases of the blood and blood-forming organs and certain disorders involving the immune mechanism: Secondary | ICD-10-CM | POA: Diagnosis not present

## 2021-10-20 DIAGNOSIS — E559 Vitamin D deficiency, unspecified: Secondary | ICD-10-CM | POA: Diagnosis not present

## 2021-10-20 DIAGNOSIS — Z0000 Encounter for general adult medical examination without abnormal findings: Secondary | ICD-10-CM

## 2021-10-20 DIAGNOSIS — Z23 Encounter for immunization: Secondary | ICD-10-CM

## 2021-10-20 DIAGNOSIS — Z1159 Encounter for screening for other viral diseases: Secondary | ICD-10-CM

## 2021-10-20 DIAGNOSIS — Z13228 Encounter for screening for other metabolic disorders: Secondary | ICD-10-CM

## 2021-10-20 LAB — COMPREHENSIVE METABOLIC PANEL
ALT: 21 U/L (ref 0–35)
AST: 22 U/L (ref 0–37)
Albumin: 4.7 g/dL (ref 3.5–5.2)
Alkaline Phosphatase: 47 U/L (ref 39–117)
BUN: 17 mg/dL (ref 6–23)
CO2: 28 mEq/L (ref 19–32)
Calcium: 9.6 mg/dL (ref 8.4–10.5)
Chloride: 103 mEq/L (ref 96–112)
Creatinine, Ser: 0.76 mg/dL (ref 0.40–1.20)
GFR: 90.95 mL/min (ref >60.00)
Glucose, Bld: 89 mg/dL (ref 70–99)
Potassium: 5.3 mEq/L — ABNORMAL HIGH (ref 3.5–5.1)
Sodium: 139 mEq/L (ref 135–145)
Total Bilirubin: 0.5 mg/dL (ref 0.2–1.2)
Total Protein: 7.4 g/dL (ref 6.0–8.3)

## 2021-10-20 LAB — CBC WITH DIFFERENTIAL/PLATELET
Basophils Absolute: 0.1 10*3/uL (ref 0.0–0.1)
Basophils Relative: 1.2 % (ref 0.0–3.0)
Eosinophils Absolute: 0.5 10*3/uL (ref 0.0–0.7)
Eosinophils Relative: 8 % — ABNORMAL HIGH (ref 0.0–5.0)
HCT: 42.6 % (ref 36.0–46.0)
Hemoglobin: 14 g/dL (ref 12.0–15.0)
Lymphocytes Relative: 31.5 % (ref 12.0–46.0)
Lymphs Abs: 1.9 10*3/uL (ref 0.7–4.0)
MCHC: 32.8 g/dL (ref 30.0–36.0)
MCV: 99 fl (ref 78.0–100.0)
Monocytes Absolute: 0.5 10*3/uL (ref 0.1–1.0)
Monocytes Relative: 8 % (ref 3.0–12.0)
Neutro Abs: 3.1 10*3/uL (ref 1.4–7.7)
Neutrophils Relative %: 51.3 % (ref 43.0–77.0)
Platelets: 293 10*3/uL (ref 150.0–400.0)
RBC: 4.3 Mil/uL (ref 3.87–5.11)
RDW: 13.7 % (ref 11.5–15.5)
WBC: 6 10*3/uL (ref 4.0–10.5)

## 2021-10-20 LAB — VITAMIN D 25 HYDROXY (VIT D DEFICIENCY, FRACTURES): VITD: 55.17 ng/mL (ref 30.00–100.00)

## 2021-10-20 LAB — LIPID PANEL
Cholesterol: 256 mg/dL — ABNORMAL HIGH (ref 0–200)
HDL: 94.5 mg/dL (ref >39.00)
LDL Cholesterol: 145 mg/dL — ABNORMAL HIGH (ref 0–99)
NonHDL: 161.81
Total CHOL/HDL Ratio: 3
Triglycerides: 84 mg/dL (ref 0.0–149.0)
VLDL: 16.8 mg/dL (ref 0.0–40.0)

## 2021-10-20 LAB — TSH: TSH: 1.67 u[IU]/mL (ref 0.35–5.50)

## 2021-10-20 LAB — HEMOGLOBIN A1C: Hgb A1c MFr Bld: 5.6 % (ref 4.6–6.5)

## 2021-10-20 NOTE — Progress Notes (Signed)
Established Patient Office Visit  Subjective:  Patient ID: Danielle Oneill, female    DOB: 1971-03-18  Age: 49 y.o. MRN: [id number]  CC:  Chief Complaint  Patient presents with  . Annual Exam    Patient states she is here for a physical. Pt has no other concerns.    HPI Danielle Oneill presents for CPE  No acute concerns  Histories reviewed and updated with patient.   Due for tdap booster. No AE in past.   Due for shingles shot #1. Hx of chicken pox. Not previously vaccinated.  Due for Hep C screening. Pt agreeable. Will collect today.   Past Medical History:  Diagnosis Date  . Anxiety   . Arthritis    elbows from accident age 55 per Ortho   . Asthma   . Depression   . Endometriosis   . GERD (gastroesophageal reflux disease)    OCC - rare     Past Surgical History:  Procedure Laterality Date  . OOPHORECTOMY  2009   left  . ORTHOPEDIC SURGERY     shattered both elbows and left wrist, age 14  . ROBOTIC ASSISTED SALPINGO OOPHERECTOMY Right 02/10/2016   Procedure: ROBOTIC ASSISTED RIGHT SALPINGO OOPHORECTOMY LYSIS OF ADHESIONS TREATMENT OF ENDOMETRIOSIS, MYOMECTOMY;  Surgeon: Princess Bruins, MD;  Location: [facility];  Service: Gynecology;  Laterality: Right;    Family History  Problem Relation Age of Onset  . Thyroid disease Mother   . Cancer Mother        thyroid  . Asthma Father   . Colon cancer Neg Hx   . Colon polyps Neg Hx   . Esophageal cancer Neg Hx   . Stomach cancer Neg Hx   . Rectal cancer Neg Hx     Social History   Socioeconomic History  . Marital status: Married    Spouse name: Not on file  . Number of children: Not on file  . Years of education: Not on file  . Highest education level: Not on file  Occupational History  . Not on file  Tobacco Use  . Smoking status: Never  . Smokeless tobacco: Never  Vaping Use  . Vaping Use: Never used  Substance and Sexual Activity  . Alcohol use: Yes    Comment: occ. wine  . Drug use: No  . Sexual  activity: Yes  Other Topics Concern  . Not on file  Social History Narrative  . Not on file   Social Determinants of Health   Financial Resource Strain: Not on file  Food Insecurity: Not on file  Transportation Needs: Not on file  Physical Activity: Not on file  Stress: Not on file  Social Connections: Not on file  Intimate Partner Violence: Not on file    Outpatient Medications Prior to Visit  Medication Sig Dispense Refill  . acyclovir (ZOVIRAX) 200 MG capsule TAKE 1 CAPSULE BY MOUTH 4  TIMES DAILY WITH OUTBREAK 360 capsule 0  . albuterol (VENTOLIN HFA) 108 (90 Base) MCG/ACT inhaler TAKE 2 PUFFS BY MOUTH EVERY 6 HOURS AS NEEDED FOR WHEEZE OR SHORTNESS OF BREATH 8.5 each 1  . alclomethasone (ACLOVATE) 5.39 % cream 1 application 2 (two) times daily.    Marland Kitchen ALPRAZolam (XANAX) 0.5 MG tablet Take 2 tabs QHS prn insomnia and 1 tab BID prn anxiety 120 tablet 3  . BIOTIN PO Take 1 tablet by mouth daily.    . Calcium-Magnesium-Vitamin D (CALCIUM MAGNESIUM PO) Take by mouth daily.    . Ergocalciferol (VITAMIN  D2) 2000 units TABS Take 1 tablet by mouth daily.    Marland Kitchen escitalopram (LEXAPRO) 20 MG tablet TAKE 1 TABLET BY MOUTH  DAILY 90 tablet 3  . Multiple Vitamin (MULTIVITAMIN) tablet Take 1 tablet by mouth daily.    . Multiple Vitamins-Minerals (EMERGEN-C IMMUNE PO) Take by mouth.     No facility-administered medications prior to visit.    No Known Allergies  ROS Review of Systems  Constitutional: Negative.   HENT: Negative.    Eyes: Negative.   Respiratory: Negative.    Cardiovascular: Negative.   Gastrointestinal: Negative.   Genitourinary: Negative.   Musculoskeletal: Negative.   Skin: Negative.   Neurological: Negative.   Psychiatric/Behavioral: Negative.    All other systems reviewed and are negative.    Objective:    Physical Exam Vitals and nursing note reviewed.  Constitutional:      General: She is not in acute distress.    Appearance: Normal appearance. She is  normal weight. She is not ill-appearing, toxic-appearing or diaphoretic.  HENT:     Head: Normocephalic and atraumatic.     Right Ear: Tympanic membrane, ear canal and external ear normal. There is no impacted cerumen.     Left Ear: Tympanic membrane, ear canal and external ear normal. There is no impacted cerumen.     Nose: Nose normal. No congestion or rhinorrhea.     Mouth/Throat:     Mouth: Mucous membranes are moist.     Pharynx: Oropharynx is clear. No oropharyngeal exudate or posterior oropharyngeal erythema.  Eyes:     General: No scleral icterus.       Right eye: No discharge.        Left eye: No discharge.     Extraocular Movements: Extraocular movements intact.     Conjunctiva/sclera: Conjunctivae normal.     Pupils: Pupils are equal, round, and reactive to light.  Cardiovascular:     Rate and Rhythm: Normal rate and regular rhythm.     Pulses: Normal pulses.     Heart sounds: Normal heart sounds. No murmur heard.   No friction rub. No gallop.  Pulmonary:     Effort: Pulmonary effort is normal. No respiratory distress.     Breath sounds: Normal breath sounds. No stridor. No wheezing, rhonchi or rales.  Chest:     Chest wall: No tenderness.  Abdominal:     General: Abdomen is flat. Bowel sounds are normal. There is no distension.     Palpations: Abdomen is soft. There is no mass.     Tenderness: There is no abdominal tenderness. There is no right CVA tenderness, left CVA tenderness, guarding or rebound.     Hernia: No hernia is present.  Musculoskeletal:        General: No swelling, tenderness, deformity or signs of injury. Normal range of motion.     Right lower leg: No edema.     Left lower leg: No edema.  Skin:    General: Skin is warm and dry.     Capillary Refill: Capillary refill takes less than 2 seconds.     Coloration: Skin is not jaundiced or pale.     Findings: No bruising, erythema, lesion or rash.  Neurological:     General: No focal deficit present.      Mental Status: She is alert and oriented to person, place, and time. Mental status is at baseline.     Cranial Nerves: No cranial nerve deficit.     Sensory: No sensory  deficit.     Motor: No weakness.     Coordination: Coordination normal.     Gait: Gait normal.     Deep Tendon Reflexes: Reflexes normal.  Psychiatric:        Mood and Affect: Mood normal.        Behavior: Behavior normal.        Thought Content: Thought content normal.        Judgment: Judgment normal.    BP 110/78   Pulse 85   Temp 98.2 F (36.8 C) (Temporal)   Resp 18   Ht 5\' 5"  (1.651 m)   LMP 01/30/2016   SpO2 100%   BMI 29.12 kg/m  Wt Readings from Last 3 Encounters:  05/10/21 175 lb (79.4 kg)  04/26/21 175 lb (79.4 kg)  06/17/20 175 lb 2 oz (79.4 kg)     Health Maintenance Due  Topic Date Due  . Hepatitis C Screening  Never done  . Zoster Vaccines- Shingrix (1 of 2) Never done  . PAP SMEAR-Modifier  12/07/2020  . COVID-19 Vaccine (4 - Booster for Moderna series) 01/17/2021  . TETANUS/TDAP  09/04/2021    There are no preventive care reminders to display for this patient.  Lab Results  Component Value Date   TSH 1.53 06/17/2020   Lab Results  Component Value Date   WBC 6.4 06/17/2020   HGB 13.8 06/17/2020   HCT 40.9 06/17/2020   MCV 98.7 06/17/2020   PLT 278.0 06/17/2020   Lab Results  Component Value Date   NA 140 06/17/2020   K 4.7 06/17/2020   CO2 28 06/17/2020   GLUCOSE 94 06/17/2020   BUN 17 06/17/2020   CREATININE 0.71 06/17/2020   BILITOT 0.6 06/17/2020   ALKPHOS 48 06/17/2020   AST 19 06/17/2020   ALT 18 06/17/2020   PROT 6.9 06/17/2020   ALBUMIN 4.6 06/17/2020   CALCIUM 9.6 06/17/2020   GFR 87.23 06/17/2020   Lab Results  Component Value Date   CHOL 257 (H) 06/17/2020   Lab Results  Component Value Date   HDL 81.20 06/17/2020   Lab Results  Component Value Date   LDLCALC 158 (H) 06/17/2020   Lab Results  Component Value Date   TRIG 88.0 06/17/2020    Lab Results  Component Value Date   CHOLHDL 3 06/17/2020   No results found for: HGBA1C    Assessment & Plan:   Problem List Items Addressed This Visit   None   No orders of the defined types were placed in this encounter.   Follow-up: No follow-ups on file.   PLAN Exam unremarkable Vaccines given Labs collected. Will follow up with the patient as warranted. Patient encouraged to call clinic with any questions, comments, or concerns.  Maximiano Coss, NP

## 2021-10-20 NOTE — Patient Instructions (Addendum)
[name] -   Danielle Oneill to meet you  No concerns on exam  Vaccines given today - tdap repeat in 10 years, shingles shot #2 in 2-6 mo (then done!).  I'll be in touch with labs  MyChart: Username: [phone] Password: [id number]   Thank you!  Rich    If you have lab work done today you will be contacted with your lab results within the next 2 weeks.  If you have not heard from [place] then please contact us. The fastest way to get your results is to register for My Chart.   IF you received an x-ray today, you will receive an invoice from [facility]. Please contact [facility] at [phone] with questions or concerns regarding your invoice.   IF you received labwork today, you will receive an invoice from [facility]. Please contact [facility] at [phone] with questions or concerns regarding your invoice.   Our billing staff will not be able to assist you with questions regarding bills from these companies.  You will be contacted with the lab results as soon as they are available. The fastest way to get your results is to activate your My Chart account. Instructions are located on the last page of this paperwork. If you have not heard from [place] regarding the results in 2 weeks, please contact this office.

## 2021-10-23 ENCOUNTER — Other Ambulatory Visit: Payer: Self-pay | Admitting: Registered Nurse

## 2021-10-23 DIAGNOSIS — E875 Hyperkalemia: Secondary | ICD-10-CM

## 2021-10-23 LAB — HEPATITIS C ANTIBODY
Hepatitis C Ab: NONREACTIVE
SIGNAL TO CUT-OFF: 0.04 (ref <1.00)

## 2021-11-01 ENCOUNTER — Telehealth: Payer: Self-pay

## 2021-11-01 NOTE — Telephone Encounter (Signed)
Caller name:Cruzita Vicente Serene   On DPR? :Yes  Call back number:[phone]  Provider they see: Birdie Riddle   Reason for call:Pt needs refill on ALPRAZolam (XANAX) 0.5 MG tablet   [id number] [place],  - [address]  [address], [place] [address]

## 2021-11-02 NOTE — Telephone Encounter (Signed)
Spoke to patient and let her know there should be refills on her xanax. She stated she would call the pharmacy. Patient also wanted her labs to be explained better to her she didn't understand them in my chart. I let her know that I would ask Dr Birdie Riddle to help with this.

## 2021-11-02 NOTE — Telephone Encounter (Signed)
She was given #120 tabs on 10/27 w/ 3 refills.  She should not need a refill at this time

## 2021-11-02 NOTE — Telephone Encounter (Signed)
LVM for patient to return my call 

## 2021-11-02 NOTE — Telephone Encounter (Signed)
Richard has commented on all of pt's labs but it looks like she has not viewed these.  Can we call her and review?

## 2021-11-06 NOTE — Telephone Encounter (Signed)
Called and discussed lab results with the patient she is coming for lab appointment on 11/21/2021+

## 2021-11-16 ENCOUNTER — Other Ambulatory Visit: Payer: Self-pay | Admitting: Family

## 2021-11-21 ENCOUNTER — Other Ambulatory Visit: Payer: 59

## 2021-12-05 ENCOUNTER — Other Ambulatory Visit: Payer: 59

## 2021-12-07 ENCOUNTER — Other Ambulatory Visit: Payer: Self-pay

## 2021-12-07 MED ORDER — ALBUTEROL SULFATE HFA 108 (90 BASE) MCG/ACT IN AERS: INHALATION_SPRAY | RESPIRATORY_TRACT | 1 refills | Status: DC

## 2021-12-14 ENCOUNTER — Other Ambulatory Visit: Payer: Self-pay

## 2021-12-14 ENCOUNTER — Telehealth: Payer: Self-pay

## 2021-12-14 MED ORDER — ACYCLOVIR 200 MG PO CAPS: ORAL_CAPSULE | ORAL | 0 refills | Status: DC

## 2021-12-14 NOTE — Telephone Encounter (Signed)
Caller name:Kevionna Honora Searson callback [phone]  Encourage patient to contact the pharmacy for refills or they can request refills through [facility]  (Please schedule appointment if patient has not been seen in over a year)  MEDICATION NAME & DOSE:acyclovir (ZOVIRAX) 200 MG capsule   Notes/Comments from patient:  [place] TO: [facility] ([place], [place] [facility]  [address], [place] [address]   Please notify patient: It takes 48-72 hours to process rx refill requests Ask patient to call pharmacy to ensure rx is ready before heading there.   (CLINICAL TO FILL OR ROUTE PER PROTOCOLS)

## 2021-12-25 ENCOUNTER — Other Ambulatory Visit: Payer: 59

## 2021-12-25 ENCOUNTER — Other Ambulatory Visit: Payer: Self-pay

## 2021-12-25 MED ORDER — ALBUTEROL SULFATE HFA 108 (90 BASE) MCG/ACT IN AERS: INHALATION_SPRAY | RESPIRATORY_TRACT | 1 refills | Status: DC

## 2022-01-01 ENCOUNTER — Telehealth: Payer: Self-pay

## 2022-01-01 MED ORDER — ALPRAZOLAM 0.5 MG PO TABS: ORAL_TABLET | ORAL | 3 refills | Status: DC

## 2022-01-01 MED ORDER — SCOPOLAMINE 1 MG/3DAYS TD PT72: 1.0000 | MEDICATED_PATCH | TRANSDERMAL | 1 refills | Status: DC

## 2022-01-01 NOTE — Telephone Encounter (Signed)
Patient aware.

## 2022-01-01 NOTE — Telephone Encounter (Signed)
Prescription for Alprazolam and Scopolamine patches sent to pharmacy

## 2022-01-01 NOTE — Telephone Encounter (Signed)
Encourage patient to contact the pharmacy for refills or they can request refills through [facility]  (Please schedule appointment if patient has not been seen in over a year)    WHAT PHARMACY WOULD THEY LIKE THIS SENT TO:   [facility] (XANAX) 0.5 MG tablet , Motion sickness patch pt is going on a trip    NOTES/COMMENTS FROM PATIENT:      [place] office please notify patient: It takes 48-72 hours to process rx refill requests Ask patient to call pharmacy to ensure rx is ready before heading there.

## 2022-01-12 ENCOUNTER — Other Ambulatory Visit: Payer: Self-pay | Admitting: Registered Nurse

## 2022-01-27 ENCOUNTER — Encounter: Payer: Self-pay | Admitting: Physician Assistant

## 2022-01-27 ENCOUNTER — Telehealth: Payer: 59 | Admitting: Physician Assistant

## 2022-01-27 DIAGNOSIS — J40 Bronchitis, not specified as acute or chronic: Secondary | ICD-10-CM | POA: Diagnosis not present

## 2022-01-27 MED ORDER — BENZONATATE 200 MG PO CAPS: 200.0000 mg | ORAL_CAPSULE | Freq: Two times a day (BID) | ORAL | 0 refills | Status: DC | PRN

## 2022-01-27 MED ORDER — PREDNISONE 10 MG PO TABS: ORAL_TABLET | ORAL | 0 refills | Status: AC

## 2022-01-27 NOTE — Patient Instructions (Signed)
?Danielle Oneill, thank you for joining Kennieth Rad, PA-C for today's virtual visit.  While this provider is not your primary care provider (PCP), if your PCP is located in our provider database this encounter information will be shared with them immediately following your visit. ? ?Consent: ?(Patient) Danielle Oneill provided verbal consent for this virtual visit at the beginning of the encounter. ? ?Current Medications: ? ?Current Outpatient Medications:  ?  benzonatate (TESSALON) 200 MG capsule, Take 1 capsule (200 mg total) by mouth 2 (two) times daily as needed for cough., Disp: 20 capsule, Rfl: 0 ?  predniSONE (DELTASONE) 10 MG tablet, Take 4 tablets (40 mg total) by mouth daily with breakfast for 3 days, THEN 3 tablets (30 mg total) daily with breakfast for 3 days, THEN 2 tablets (20 mg total) daily with breakfast for 3 days, THEN 1 tablet (10 mg total) daily with breakfast for 3 days., Disp: 30 tablet, Rfl: 0 ?  acyclovir (ZOVIRAX) 200 MG capsule, Take 1 capsule by mouth 4 times daily with outbreak, Disp: 360 capsule, Rfl: 0 ?  albuterol (VENTOLIN HFA) 108 (90 Base) MCG/ACT inhaler, TAKE 2 PUFFS BY MOUTH EVERY 6 HOURS AS NEEDED FOR WHEEZE OR SHORTNESS OF BREATH, Disp: 8.5 each, Rfl: 1 ?  alclomethasone (ACLOVATE) 1.82 % cream, 1 application 2 (two) times daily., Disp: , Rfl:  ?  ALPRAZolam (XANAX) 0.5 MG tablet, Take 2 tabs QHS prn insomnia and 1 tab BID prn anxiety, Disp: 120 tablet, Rfl: 3 ?  BIOTIN PO, Take 1 tablet by mouth daily., Disp: , Rfl:  ?  Calcium-Magnesium-Vitamin D (CALCIUM MAGNESIUM PO), Take by mouth daily., Disp: , Rfl:  ?  Ergocalciferol (VITAMIN D2) 2000 units TABS, Take 1 tablet by mouth daily., Disp: , Rfl:  ?  escitalopram (LEXAPRO) 20 MG tablet, TAKE 1 TABLET BY MOUTH  DAILY, Disp: 90 tablet, Rfl: 3 ?  Multiple Vitamin (MULTIVITAMIN) tablet, Take 1 tablet by mouth daily., Disp: , Rfl:  ?  Multiple Vitamins-Minerals (EMERGEN-C IMMUNE PO), Take by mouth., Disp: , Rfl:  ?  scopolamine  (TRANSDERM-SCOP) 1 MG/3DAYS, Place 1 patch (1.5 mg total) onto the skin every 3 (three) days., Disp: 4 patch, Rfl: 1  ? ?Medications ordered in this encounter:  ?Meds ordered this encounter  ?Medications  ? predniSONE (DELTASONE) 10 MG tablet  ?  Sig: Take 4 tablets (40 mg total) by mouth daily with breakfast for 3 days, THEN 3 tablets (30 mg total) daily with breakfast for 3 days, THEN 2 tablets (20 mg total) daily with breakfast for 3 days, THEN 1 tablet (10 mg total) daily with breakfast for 3 days.  ?  Dispense:  30 tablet  ?  Refill:  0  ?  Order Specific Question:   Supervising Provider  ?  Answer:   Noemi Chapel [[id number]]  ? benzonatate (TESSALON) 200 MG capsule  ?  Sig: Take 1 capsule (200 mg total) by mouth 2 (two) times daily as needed for cough.  ?  Dispense:  20 capsule  ?  Refill:  0  ?  Order Specific Question:   Supervising Provider  ?  Answer:   Noemi Chapel [[id number]]  ?  ? ?*If you need refills on other medications prior to your next appointment, please contact your pharmacy* ? ?Follow-Up: ?Call back or seek an in-person evaluation if the symptoms worsen or if the condition fails to improve as anticipated. ? ?Other Instructions ?I strongly encourage you to take a home COVID test as well.  Please report any positive findings.  Make sure that you are staying very well-hydrated and getting plenty of rest.  I hope that you feel better soon. ? ? ?If you have been instructed to have an in-person evaluation today at a local Urgent Care facility, please use the link below. It will take you to a list of all of our available [facility], including address, phone number and hours of operation. Please do not delay care.  ?[facility] ? ?If you or a family member do not have a primary care provider, use the link below to schedule a visit and establish care. When you choose a [facility] primary care physician or advanced practice provider, you gain a long-term partner in health. ?Find a  Primary Care Provider ? ?Learn more about [facility] in-office and virtual care options: ?[facility] ? ?

## 2022-01-27 NOTE — Progress Notes (Signed)
?Virtual Visit Consent  ? ?[name], you are scheduled for a virtual visit with a [facility] provider today.   ?  ?Just as with appointments in the office, your consent must be obtained to participate.  Your consent will be active for this visit and any virtual visit you may have with one of our providers in the next 365 days.   ?  ?If you have a MyChart account, a copy of this consent can be sent to you electronically.  All virtual visits are billed to your insurance company just like a traditional visit in the office.   ? ?As this is a virtual visit, video technology does not allow for your provider to perform a traditional examination.  This may limit your provider's ability to fully assess your condition.  If your provider identifies any concerns that need to be evaluated in person or the need to arrange testing (such as labs, EKG, etc.), we will make arrangements to do so.   ?  ?Although advances in technology are sophisticated, we cannot ensure that it will always work on either your end or our end.  If the connection with a video visit is poor, the visit may have to be switched to a telephone visit.  With either a video or telephone visit, we are not always able to ensure that we have a secure connection.    ? ?I need to obtain your verbal consent now.   Are you willing to proceed with your visit today?  ?  ?Kember Boch has provided verbal consent on 01/27/2022 for a virtual visit (video or telephone). ?  ?Kennieth Rad, PA-C  ? ?Date: 01/27/2022 10:52 AM ? ? ?Virtual Visit via Video Note  ? ?I, Kennieth Rad, connected with  Danielle Oneill  ([id number], [birth year]) on 01/27/22 at 10:30 AM EST by a video-enabled telemedicine application and verified that I am speaking with the correct person using two identifiers. ? ?Location: ?Patient: Virtual Visit Location Patient: Mobile ?Provider: Virtual Visit Location Provider: Home Office ?  ?I discussed the limitations of evaluation and management by telemedicine  and the availability of in person appointments. The patient expressed understanding and agreed to proceed.   ? ?History of Present Illness: ?Danielle Oneill is a 51 y.o. who identifies as a female who was assigned female at birth, states that she started having a productive cough with yellow sputum and yellow nasal discharge on Monday 6 days ago.  Denies any other URI symptoms.  States that she did recently have airplane travel to and  from [place], otherwise unknown sick contacts.  Has not taken any COVID test since her symptoms began. ? ?Reports the cough is keeping her awake at night, states that the cough feels "violent".  States that she does have a history of asthma, has been using her albuterol inhaler several times a day without relief.  States that she has also been taking over-the-counter cough medications without relief.  Is eating and drinking okay. ? ? ?HPI: HPI  ?Problems:  ?Patient Active Problem List  ? Diagnosis Date Noted  ? Overweight (BMI 25.0-29.9) 06/12/2019  ? Vitamin D deficiency 06/06/2018  ? Migraine headache with aura 09/27/2017  ? Patellar tendonitis 08/21/2012  ? Pre-syncope 06/26/2012  ? General medical examination 11/05/2011  ? DYSMENORRHEA 09/14/2009  ? Insomnia 09/14/2009  ? Generalized anxiety disorder 10/24/2007  ? HERPES, GENITAL NOS 06/03/2007  ? ASTHMA 06/03/2007  ?  ?Allergies: No Known Allergies ?Medications:  ?Current Outpatient Medications:  ?  benzonatate (TESSALON) 200 MG capsule, Take 1 capsule (200 mg total) by mouth 2 (two) times daily as needed for cough., Disp: 20 capsule, Rfl: 0 ?  predniSONE (DELTASONE) 10 MG tablet, Take 4 tablets (40 mg total) by mouth daily with breakfast for 3 days, THEN 3 tablets (30 mg total) daily with breakfast for 3 days, THEN 2 tablets (20 mg total) daily with breakfast for 3 days, THEN 1 tablet (10 mg total) daily with breakfast for 3 days., Disp: 30 tablet, Rfl: 0 ?  acyclovir (ZOVIRAX) 200 MG capsule, Take 1 capsule by mouth 4 times daily  with outbreak, Disp: 360 capsule, Rfl: 0 ?  albuterol (VENTOLIN HFA) 108 (90 Base) MCG/ACT inhaler, TAKE 2 PUFFS BY MOUTH EVERY 6 HOURS AS NEEDED FOR WHEEZE OR SHORTNESS OF BREATH, Disp: 8.5 each, Rfl: 1 ?  alclomethasone (ACLOVATE) 5.42 % cream, 1 application 2 (two) times daily., Disp: , Rfl:  ?  ALPRAZolam (XANAX) 0.5 MG tablet, Take 2 tabs QHS prn insomnia and 1 tab BID prn anxiety, Disp: 120 tablet, Rfl: 3 ?  BIOTIN PO, Take 1 tablet by mouth daily., Disp: , Rfl:  ?  Calcium-Magnesium-Vitamin D (CALCIUM MAGNESIUM PO), Take by mouth daily., Disp: , Rfl:  ?  Ergocalciferol (VITAMIN D2) 2000 units TABS, Take 1 tablet by mouth daily., Disp: , Rfl:  ?  escitalopram (LEXAPRO) 20 MG tablet, TAKE 1 TABLET BY MOUTH  DAILY, Disp: 90 tablet, Rfl: 3 ?  Multiple Vitamin (MULTIVITAMIN) tablet, Take 1 tablet by mouth daily., Disp: , Rfl:  ?  Multiple Vitamins-Minerals (EMERGEN-C IMMUNE PO), Take by mouth., Disp: , Rfl:  ?  scopolamine (TRANSDERM-SCOP) 1 MG/3DAYS, Place 1 patch (1.5 mg total) onto the skin every 3 (three) days., Disp: 4 patch, Rfl: 1 ? ?Observations/Objective: ?Patient is well-developed, well-nourished in no acute distress.  ?Resting comfortably  at home.  ?Head is normocephalic, atraumatic.  ?No labored breathing.  ?Speech is clear and coherent with logical content.  ?Patient is alert and oriented at baseline.  ? ? ?Assessment and Plan: ?1. Bronchitis ?- predniSONE (DELTASONE) 10 MG tablet; Take 4 tablets (40 mg total) by mouth daily with breakfast for 3 days, THEN 3 tablets (30 mg total) daily with breakfast for 3 days, THEN 2 tablets (20 mg total) daily with breakfast for 3 days, THEN 1 tablet (10 mg total) daily with breakfast for 3 days.  Dispense: 30 tablet; Refill: 0 ?- benzonatate (TESSALON) 200 MG capsule; Take 1 capsule (200 mg total) by mouth 2 (two) times daily as needed for cough.  Dispense: 20 capsule; Refill: 0 ? ?Patient encouraged to continue using albuterol as needed to resume her Zyrtec.   Patient strongly encouraged to take home COVID test, patient understands and agrees. ? ?Follow Up Instructions: ?I discussed the assessment and treatment plan with the patient. The patient was provided an opportunity to ask questions and all were answered. The patient agreed with the plan and demonstrated an understanding of the instructions.  A copy of instructions were sent to the patient via MyChart unless otherwise noted below.  ? ? ?The patient was advised to call back or seek an in-person evaluation if the symptoms worsen or if the condition fails to improve as anticipated. ? ?Time:  ?I spent 12 minutes with the patient via telehealth technology discussing the above problems/concerns.   ? ?Marcile Fuquay S Mayers, PA-C ? ?

## 2022-01-29 ENCOUNTER — Encounter: Payer: Self-pay | Admitting: Family Medicine

## 2022-01-29 ENCOUNTER — Telehealth (INDEPENDENT_AMBULATORY_CARE_PROVIDER_SITE_OTHER): Payer: 59 | Admitting: Family Medicine

## 2022-01-29 DIAGNOSIS — R051 Acute cough: Secondary | ICD-10-CM

## 2022-01-29 MED ORDER — GUAIFENESIN-CODEINE 100-10 MG/5ML PO SYRP: 10.0000 mL | ORAL_SOLUTION | Freq: Three times a day (TID) | ORAL | 0 refills | Status: DC | PRN

## 2022-01-29 NOTE — Progress Notes (Signed)
? ?Virtual Visit via Video  ? ?I connected with patient on 01/29/22 at  2:30 PM EDT by a video enabled telemedicine application and verified that I am speaking with the correct person using two identifiers. ? ?Location patient: Home ?Location provider: [facility], Office ?Persons participating in the virtual visit: Patient, Provider, [place] Claiborne Billings C) ? ?I discussed the limitations of evaluation and management by telemedicine and the availability of in person appointments. The patient expressed understanding and agreed to proceed. ? ?Subjective:  ? ?HPI:  ? ?Cough- 'i feel like crap since last Sunday'.  Cough- productive of yellow sputum.  Did a virtual visit on Saturday and was given Prednisone and cough pills.  Pt reports cough is most bothersome at night.  Was told to take OTC cough meds along w/ Tessalon.  'i need something stronger'.  Denies sinus pain/pressure.  No HA.  No fevers.  Having to use her inhaler more frequently ? ?ROS:  ? ?See pertinent positives and negatives per HPI. ? ?Patient Active Problem List  ? Diagnosis Date Noted  ? Overweight (BMI 25.0-29.9) 06/12/2019  ? Vitamin D deficiency 06/06/2018  ? Migraine headache with aura 09/27/2017  ? Patellar tendonitis 08/21/2012  ? Pre-syncope 06/26/2012  ? General medical examination 11/05/2011  ? DYSMENORRHEA 09/14/2009  ? Insomnia 09/14/2009  ? Generalized anxiety disorder 10/24/2007  ? HERPES, GENITAL NOS 06/03/2007  ? ASTHMA 06/03/2007  ?  ?Social History  ? ?Tobacco Use  ? Smoking status: Never  ? Smokeless tobacco: Never  ?Substance Use Topics  ? Alcohol use: Yes  ?  Comment: occ. wine  ? ? ?Current Outpatient Medications:  ?  acyclovir (ZOVIRAX) 200 MG capsule, Take 1 capsule by mouth 4 times daily with outbreak, Disp: 360 capsule, Rfl: 0 ?  albuterol (VENTOLIN HFA) 108 (90 Base) MCG/ACT inhaler, TAKE 2 PUFFS BY MOUTH EVERY 6 HOURS AS NEEDED FOR WHEEZE OR SHORTNESS OF BREATH, Disp: 8.5 each, Rfl: 1 ?  alclomethasone (ACLOVATE) 3.76 % cream,  1 application 2 (two) times daily., Disp: , Rfl:  ?  ALPRAZolam (XANAX) 0.5 MG tablet, Take 2 tabs QHS prn insomnia and 1 tab BID prn anxiety, Disp: 120 tablet, Rfl: 3 ?  benzonatate (TESSALON) 200 MG capsule, Take 1 capsule (200 mg total) by mouth 2 (two) times daily as needed for cough., Disp: 20 capsule, Rfl: 0 ?  BIOTIN PO, Take 1 tablet by mouth daily., Disp: , Rfl:  ?  Calcium-Magnesium-Vitamin D (CALCIUM MAGNESIUM PO), Take by mouth daily., Disp: , Rfl:  ?  Ergocalciferol (VITAMIN D2) 2000 units TABS, Take 1 tablet by mouth daily., Disp: , Rfl:  ?  escitalopram (LEXAPRO) 20 MG tablet, TAKE 1 TABLET BY MOUTH  DAILY, Disp: 90 tablet, Rfl: 3 ?  Multiple Vitamin (MULTIVITAMIN) tablet, Take 1 tablet by mouth daily., Disp: , Rfl:  ?  Multiple Vitamins-Minerals (EMERGEN-C IMMUNE PO), Take by mouth., Disp: , Rfl:  ?  predniSONE (DELTASONE) 10 MG tablet, Take 4 tablets (40 mg total) by mouth daily with breakfast for 3 days, THEN 3 tablets (30 mg total) daily with breakfast for 3 days, THEN 2 tablets (20 mg total) daily with breakfast for 3 days, THEN 1 tablet (10 mg total) daily with breakfast for 3 days., Disp: 30 tablet, Rfl: 0 ?  scopolamine (TRANSDERM-SCOP) 1 MG/3DAYS, Place 1 patch (1.5 mg total) onto the skin every 3 (three) days. (Patient not taking: Reported on 01/29/2022), Disp: 4 patch, Rfl: 1 ? ?No Known Allergies ? ?Objective:  ? ?  LMP 01/30/2016  ? ?AAOx3, NAD ?NCAT, EOMI ?No obvious CN deficits ?Coloring WNL ?Pt is able to speak clearly, coherently without shortness of breath or increased work of breathing.  ?Thought process is linear.  Mood is appropriate.  ? ?Assessment and Plan:  ? ?Cough- new.  Pt just had virtual visit on Saturday and was prescribed Prednisone.  She feels it is helping but the nighttime cough persists.  Will start Cheratussin as needed for cough and sleep.  Encouraged TID use of Tessalon combined w/ OTC cough syrup for daytime cough.  Reviewed supportive care and red flags that should  prompt return.  Pt expressed understanding and is in agreement w/ plan.  ? ?Annye Asa, MD ?01/29/2022 ? ? ?

## 2022-01-30 ENCOUNTER — Other Ambulatory Visit: Payer: 59

## 2022-01-30 ENCOUNTER — Other Ambulatory Visit: Payer: Self-pay

## 2022-01-30 MED ORDER — ALBUTEROL SULFATE HFA 108 (90 BASE) MCG/ACT IN AERS: INHALATION_SPRAY | RESPIRATORY_TRACT | 1 refills | Status: DC

## 2022-02-02 ENCOUNTER — Telehealth: Payer: Self-pay | Admitting: Family Medicine

## 2022-02-02 NOTE — Telephone Encounter (Signed)
Pt called in stating that she is still having the coughing fits not being able to sleep at night. She is states that she feels like she should  be feeling better and she isn't. She states this will be 2wks on Sunday.  ? ?She wanted to know what else to do. ? ? ?

## 2022-02-02 NOTE — Telephone Encounter (Signed)
She is still on her Prednisone taper, she should use her albuterol inhaler for coughing fits, make sure she is taking daily allergy medication (claritin or zyrtec), drink plenty of water, try and prop herself up somewhat at night, and use cough medicine as needed.  Post infectious coughs can last 4-6 weeks but should slowly improve w/ time. ?

## 2022-02-02 NOTE — Telephone Encounter (Signed)
Pt became very frustrated and states she is doing all of these things and not improving notes she would like something else to be prescribed or another treatment plan. I did ask about scheduling a follow up but pt was resistant noting that she lives 40 min away and that she has to work asked if this could be a virtual I did express concern a physical evaluation may be more beneficial with her not improving but pt wanted to see if you would change treatment without visit first.  ?

## 2022-02-02 NOTE — Telephone Encounter (Signed)
Should pt return for follow up given continued sxs from last visit? ?

## 2022-02-05 NOTE — Telephone Encounter (Signed)
If pt isn't feeling better, she will need a chest xray and a follow up visit. ?

## 2022-02-05 NOTE — Telephone Encounter (Signed)
LVM for patient asking for her to return my call ?

## 2022-02-06 NOTE — Telephone Encounter (Signed)
Communicated via mychart

## 2022-02-19 ENCOUNTER — Other Ambulatory Visit: Payer: 59

## 2022-03-01 ENCOUNTER — Telehealth: Payer: Self-pay

## 2022-03-01 NOTE — Telephone Encounter (Signed)
Received fax from pharmacy, patient's albuterol isn't covered. Alternative that is covered is proair HFA. Can you please [facility] this [facility] ?

## 2022-03-10 ENCOUNTER — Other Ambulatory Visit: Payer: Self-pay | Admitting: Registered Nurse

## 2022-03-10 DIAGNOSIS — J40 Bronchitis, not specified as acute or chronic: Secondary | ICD-10-CM

## 2022-03-10 MED ORDER — ALBUTEROL SULFATE HFA 108 (90 BASE) MCG/ACT IN AERS: 2.0000 | INHALATION_SPRAY | Freq: Four times a day (QID) | RESPIRATORY_TRACT | 0 refills | Status: DC | PRN

## 2022-03-10 NOTE — Telephone Encounter (Signed)
Sent ? ?Thanks, ? ?Rich

## 2022-04-11 ENCOUNTER — Other Ambulatory Visit: Payer: Self-pay | Admitting: Registered Nurse

## 2022-04-11 DIAGNOSIS — J40 Bronchitis, not specified as acute or chronic: Secondary | ICD-10-CM

## 2022-05-08 ENCOUNTER — Telehealth: Payer: Self-pay | Admitting: Family Medicine

## 2022-05-08 NOTE — Telephone Encounter (Signed)
Pt called stating that she is going on cruise tomorrow. PT wants know if she suppose to put the sea sick patch on today or tomorrow before boarding.

## 2022-05-08 NOTE — Telephone Encounter (Signed)
Spoke w/ pt and informed her to place patch on 30 minutes before boarding .

## 2022-05-08 NOTE — Telephone Encounter (Signed)
As long as it's 30+ minutes before boarding she should be fine

## 2022-08-01 ENCOUNTER — Other Ambulatory Visit: Payer: Self-pay | Admitting: Family Medicine

## 2022-08-01 NOTE — Telephone Encounter (Signed)
Patient is requesting a refill of the following medications: Requested Prescriptions   Pending Prescriptions Disp Refills   ALPRAZolam (XANAX) 0.5 MG tablet [Pharmacy Med Name: ALPRAZOLAM 0.5 MG TABLET] 120 tablet     Sig: TAKE 2 TABLETS BY MOUTH NIGHTLY AT BEDTIME AS NEEDED FOR INSOMNIA AND 1 TABLET TWICE DAILY AS NEEDED FOR ANXIETY    Date of patient request: 08/01/22 Last office visit: 01/29/22 Date of last refill: 01/01/22 Last refill amount: 120

## 2022-08-02 NOTE — Telephone Encounter (Signed)
Patient is requesting a refill of the following medications: Requested Prescriptions   Pending Prescriptions Disp Refills   escitalopram (LEXAPRO) 20 MG tablet [Pharmacy Med Name: Escitalopram Oxalate 20 MG Oral Tablet] 90 tablet 3    Sig: TAKE 1 TABLET BY MOUTH  DAILY    Date of patient request: 08/02/2022 Last office visit: 10/20/2022 Date of last refill: 08/31/2021 Last refill amount: 90 tablets 3 refills Follow up time period per chart: 10/25/2022

## 2022-08-25 LAB — HM MAMMOGRAPHY

## 2022-08-28 ENCOUNTER — Encounter: Payer: Self-pay | Admitting: Family Medicine

## 2022-10-04 ENCOUNTER — Other Ambulatory Visit: Payer: Self-pay

## 2022-10-04 ENCOUNTER — Other Ambulatory Visit: Payer: Self-pay | Admitting: Family Medicine

## 2022-10-04 DIAGNOSIS — J40 Bronchitis, not specified as acute or chronic: Secondary | ICD-10-CM

## 2022-10-04 MED ORDER — ALBUTEROL SULFATE HFA 108 (90 BASE) MCG/ACT IN AERS: INHALATION_SPRAY | RESPIRATORY_TRACT | 11 refills | Status: DC

## 2022-10-04 NOTE — Telephone Encounter (Signed)
Called patient and informed her this has been sent

## 2022-10-04 NOTE — Telephone Encounter (Signed)
Patient is requesting a refill of the following medications: Requested Prescriptions   Pending Prescriptions Disp Refills   albuterol (VENTOLIN HFA) 108 (90 Base) MCG/ACT inhaler 8.5 each 11    Sig: TAKE 2 PUFFS BY MOUTH EVERY 6 HOURS AS NEEDED FOR WHEEZE OR SHORTNESS OF BREATH    Date of patient request: 10/04/22 Last office visit: 01/29/22 Date of last refill: 04/11/22 Last refill amount: 8.5  Follow up time period per chart: 1year    Rx under bronchitis dx is refill appropriate or should she have a visit first?

## 2022-10-04 NOTE — Telephone Encounter (Signed)
Encourage patient to contact the pharmacy for refills or they can request refills through [facility]  (Please schedule appointment if patient has not been seen in over a year)    WHAT PHARMACY WOULD THEY LIKE THIS SENT TO: [facility] ([place], [place] NAME & DOSE: Albuterol inhaler   NOTES/COMMENTS FROM PATIENT: Pt was told by pharmacy that she is out of refills.      [place] office please notify patient: It takes 48-72 hours to process rx refill requests Ask patient to call pharmacy to ensure rx is ready before heading there.

## 2022-10-08 ENCOUNTER — Other Ambulatory Visit: Payer: Self-pay

## 2022-10-08 ENCOUNTER — Telehealth: Payer: Self-pay

## 2022-10-08 MED ORDER — ALBUTEROL SULFATE HFA 108 (90 BASE) MCG/ACT IN AERS: 2.0000 | INHALATION_SPRAY | Freq: Four times a day (QID) | RESPIRATORY_TRACT | 1 refills | Status: DC | PRN

## 2022-10-08 NOTE — Telephone Encounter (Signed)
Sent in Albuterol inhaler to pharmacy

## 2022-10-08 NOTE — Telephone Encounter (Signed)
Pharmacy states the pt Rx Ventolin inhaler is not covered by insurance . They are asking if we can send in albuterol 90 mcg . Can we change the Rx ?

## 2022-10-08 NOTE — Telephone Encounter (Signed)
Ok to change to whatever generic albuterol is covered.  We have had this problem in the past where Epic defaults generic albuterol to Ventolin.  We can either call the pharmacy or make note on the prescription that 'please substitute preferred generic albuterol'

## 2022-10-25 ENCOUNTER — Encounter: Payer: Self-pay | Admitting: Family Medicine

## 2022-10-25 ENCOUNTER — Ambulatory Visit (INDEPENDENT_AMBULATORY_CARE_PROVIDER_SITE_OTHER): Payer: 59 | Admitting: Family Medicine

## 2022-10-25 ENCOUNTER — Telehealth: Payer: Self-pay | Admitting: Family Medicine

## 2022-10-25 ENCOUNTER — Telehealth: Payer: Self-pay

## 2022-10-25 ENCOUNTER — Other Ambulatory Visit: Payer: Self-pay

## 2022-10-25 VITALS — BP 124/80 | HR 84 | Temp 98.8°F | Resp 17 | Ht 65.0 in

## 2022-10-25 DIAGNOSIS — E663 Overweight: Secondary | ICD-10-CM

## 2022-10-25 DIAGNOSIS — Z0000 Encounter for general adult medical examination without abnormal findings: Secondary | ICD-10-CM | POA: Diagnosis not present

## 2022-10-25 DIAGNOSIS — E559 Vitamin D deficiency, unspecified: Secondary | ICD-10-CM

## 2022-10-25 LAB — VITAMIN D 25 HYDROXY (VIT D DEFICIENCY, FRACTURES): VITD: 54.25 ng/mL (ref 30.00–100.00)

## 2022-10-25 LAB — HEPATIC FUNCTION PANEL
ALT: 18 U/L (ref 0–35)
AST: 20 U/L (ref 0–37)
Albumin: 4.7 g/dL (ref 3.5–5.2)
Alkaline Phosphatase: 45 U/L (ref 39–117)
Bilirubin, Direct: 0.1 mg/dL (ref 0.0–0.3)
Total Bilirubin: 0.5 mg/dL (ref 0.2–1.2)
Total Protein: 7.3 g/dL (ref 6.0–8.3)

## 2022-10-25 LAB — CBC WITH DIFFERENTIAL/PLATELET
Basophils Absolute: 0.1 10*3/uL (ref 0.0–0.1)
Basophils Relative: 1 % (ref 0.0–3.0)
Eosinophils Absolute: 0.4 10*3/uL (ref 0.0–0.7)
Eosinophils Relative: 6.9 % — ABNORMAL HIGH (ref 0.0–5.0)
HCT: 42.5 % (ref 36.0–46.0)
Hemoglobin: 14.3 g/dL (ref 12.0–15.0)
Lymphocytes Relative: 34.4 % (ref 12.0–46.0)
Lymphs Abs: 2.2 10*3/uL (ref 0.7–4.0)
MCHC: 33.6 g/dL (ref 30.0–36.0)
MCV: 97.1 fl (ref 78.0–100.0)
Monocytes Absolute: 0.5 10*3/uL (ref 0.1–1.0)
Monocytes Relative: 7.4 % (ref 3.0–12.0)
Neutro Abs: 3.2 10*3/uL (ref 1.4–7.7)
Neutrophils Relative %: 50.3 % (ref 43.0–77.0)
Platelets: 323 10*3/uL (ref 150.0–400.0)
RBC: 4.37 Mil/uL (ref 3.87–5.11)
RDW: 13.6 % (ref 11.5–15.5)
WBC: 6.4 10*3/uL (ref 4.0–10.5)

## 2022-10-25 LAB — LIPID PANEL
Cholesterol: 253 mg/dL — ABNORMAL HIGH (ref 0–200)
HDL: 87.3 mg/dL (ref >39.00)
LDL Cholesterol: 137 mg/dL — ABNORMAL HIGH (ref 0–99)
NonHDL: 165.36
Total CHOL/HDL Ratio: 3
Triglycerides: 142 mg/dL (ref 0.0–149.0)
VLDL: 28.4 mg/dL (ref 0.0–40.0)

## 2022-10-25 LAB — BASIC METABOLIC PANEL
BUN: 21 mg/dL (ref 6–23)
CO2: 28 mEq/L (ref 19–32)
Calcium: 9.1 mg/dL (ref 8.4–10.5)
Chloride: 102 mEq/L (ref 96–112)
Creatinine, Ser: 0.73 mg/dL (ref 0.40–1.20)
GFR: 94.77 mL/min (ref >60.00)
Glucose, Bld: 96 mg/dL (ref 70–99)
Potassium: 4.5 mEq/L (ref 3.5–5.1)
Sodium: 138 mEq/L (ref 135–145)

## 2022-10-25 LAB — TSH: TSH: 1.44 u[IU]/mL (ref 0.35–5.50)

## 2022-10-25 MED ORDER — ALBUTEROL SULFATE HFA 108 (90 BASE) MCG/ACT IN AERS: 2.0000 | INHALATION_SPRAY | Freq: Four times a day (QID) | RESPIRATORY_TRACT | 0 refills | Status: DC | PRN

## 2022-10-25 NOTE — Telephone Encounter (Signed)
error 

## 2022-10-25 NOTE — Telephone Encounter (Signed)
Informed pt of lab results  

## 2022-10-25 NOTE — Assessment & Plan Note (Signed)
Pt's PE WNL.  UTD on pap, mammo, colonoscopy, Tdap, shingles, flu.  Check labs.  Anticipatory guidance provided.

## 2022-10-25 NOTE — Progress Notes (Signed)
   Subjective:    Patient ID: Danielle Oneill, female    DOB: March 03, 1971, 51 y.o.   MRN: [id number]  HPI CPE- UTD on mammo, colonoscopy, Tdap, flu  Patient Care Team    Relationship Specialty Notifications Start End  Midge Minium, MD PCP - General Family Medicine  04/02/11   Princess Bruins, MD Consulting Physician Obstetrics and Gynecology  05/20/15     Health Maintenance  Topic Date Due   PAP SMEAR-Modifier  12/07/2020   COVID-19 Vaccine (5 - 2023-24 season) 11/21/2022   MAMMOGRAM  08/26/2023   COLONOSCOPY (Pts 45-60yr Insurance coverage will need to be confirmed)  05/10/2028   DTaP/Tdap/Td (3 - Td or Tdap) 10/21/2031   INFLUENZA VACCINE  Completed   Hepatitis C Screening  Completed   Zoster Vaccines- Shingrix  Completed   HPV VACCINES  Aged Out   HIV Screening  Discontinued      Review of Systems Patient reports no vision/ hearing changes, adenopathy,fever, weight change,  persistant/recurrent hoarseness , swallowing issues, chest pain, palpitations, edema, persistant/recurrent cough, hemoptysis, dyspnea (rest/exertional/paroxysmal nocturnal), gastrointestinal bleeding (melena, rectal bleeding), abdominal pain, significant heartburn, bowel changes, GU symptoms (dysuria, hematuria, incontinence), Gyn symptoms (abnormal  bleeding, pain),  syncope, focal weakness, memory loss, numbness & tingling, skin/hair/nail changes, abnormal bruising or bleeding, anxiety, or depression.     Objective:   Physical Exam General Appearance:    Alert, cooperative, no distress, appears stated age  Head:    Normocephalic, without obvious abnormality, atraumatic  Eyes:    PERRL, conjunctiva/corneas clear, EOM's intact both eyes  Ears:    Normal TM's and external ear canals, both ears  Nose:   Nares normal, septum midline, mucosa normal, no drainage    or sinus tenderness  Throat:   Lips, mucosa, and tongue normal; teeth and gums normal  Neck:   Supple, symmetrical, trachea midline, no  adenopathy;    Thyroid: no enlargement/tenderness/nodules  Back:     Symmetric, no curvature, ROM normal, no CVA tenderness  Lungs:     Clear to auscultation bilaterally, respirations unlabored  Chest Wall:    No tenderness or deformity   Heart:    Regular rate and rhythm, S1 and S2 normal, no murmur, rub   or gallop  Breast Exam:    Deferred to GYN  Abdomen:     Soft, non-tender, bowel sounds active all four quadrants,    no masses, no organomegaly  Genitalia:    Deferred to GYN  Rectal:    Extremities:   Extremities normal, atraumatic, no cyanosis or edema  Pulses:   2+ and symmetric all extremities  Skin:   Skin color, texture, turgor normal, no rashes or lesions  Lymph nodes:   Cervical, supraclavicular, and axillary nodes normal  Neurologic:   CNII-XII intact, normal strength, sensation and reflexes    throughout          Assessment & Plan:

## 2022-10-25 NOTE — Assessment & Plan Note (Signed)
Check labs and replete prn. 

## 2022-10-25 NOTE — Patient Instructions (Signed)
Follow up in 1 year or as needed We'll notify you of your lab results and make any changes if needed Keep up the good work on healthy diet and regular exercise- you look great! Call with any questions or concerns Stay Safe! Stay Healthy! Happy Holidays!!!

## 2022-10-25 NOTE — Assessment & Plan Note (Signed)
Ongoing issue for pt.  Refused to weigh today.  Encouraged healthy diet and regular exercise.  Check labs to risk stratify.  Will follow.

## 2022-10-25 NOTE — Telephone Encounter (Signed)
-----   Message from Midge Minium, MD sent at 10/25/2022  4:08 PM EST ----- Total cholesterol is again high but misleading b/c your HDL is so good!  The ratio of good to bad is excellent so no medication needed.  Remainder of labs look great!  No changes at this time

## 2022-10-31 ENCOUNTER — Telehealth: Payer: Self-pay

## 2022-10-31 MED ORDER — ALBUTEROL SULFATE HFA 108 (90 BASE) MCG/ACT IN AERS: 2.0000 | INHALATION_SPRAY | Freq: Four times a day (QID) | RESPIRATORY_TRACT | 2 refills | Status: DC | PRN

## 2022-10-31 NOTE — Telephone Encounter (Signed)
Spoke with pharmacist and she states insurance will cover Proventil

## 2022-10-31 NOTE — Telephone Encounter (Signed)
Prescription sent to pharmacy w/ note indicating they are to fill proventil b/c our system keeps defaulting to Ventolin.

## 2022-10-31 NOTE — Telephone Encounter (Signed)
No mam they do specify which inhaler they would cover they just stated not albuterol

## 2022-10-31 NOTE — Telephone Encounter (Signed)
Pharmacy is asking if we can send in an alternative inhaler they will not cover the Albuterol HFA 90 mcg

## 2022-10-31 NOTE — Telephone Encounter (Signed)
Albuterol is the only rescue inhaler available.  She needs to call her pharmacy and see if they have any recommendations.  But they should not decline something like Albuterol which is needed for wheezing/shortness of breath.  Maybe they just have a preferred manufacturer for the Albuterol

## 2022-10-31 NOTE — Telephone Encounter (Signed)
Do they have a preferred inhaler?  Any generic Albuterol inhaler would be appropriate (same directions)

## 2022-10-31 NOTE — Telephone Encounter (Signed)
Left pt a Vm stating we sent in Proventil

## 2022-11-13 ENCOUNTER — Other Ambulatory Visit: Payer: Self-pay | Admitting: Family Medicine

## 2022-11-13 NOTE — Telephone Encounter (Signed)
Request reviewed in Dr. Virgil Benedict absence.  Recent physical December 7.  Controlled substance database reviewed.  Last filled #120 on 09/19/2022, previously 08/01/2022.  Refill ordered.

## 2022-11-13 NOTE — Telephone Encounter (Signed)
Xanax 0.5 mg LOV: 10/25/22 Last Refill:+08/01/22 Upcoming appt: none

## 2022-11-22 ENCOUNTER — Telehealth: Payer: Self-pay | Admitting: Family Medicine

## 2022-11-22 ENCOUNTER — Other Ambulatory Visit: Payer: Self-pay

## 2022-11-22 MED ORDER — ALBUTEROL SULFATE HFA 108 (90 BASE) MCG/ACT IN AERS: 2.0000 | INHALATION_SPRAY | Freq: Four times a day (QID) | RESPIRATORY_TRACT | 2 refills | Status: DC | PRN

## 2022-11-22 NOTE — Telephone Encounter (Signed)
Rx has been sent to [facility]. 

## 2022-11-22 NOTE — Telephone Encounter (Signed)
Encourage patient to contact the pharmacy for refills or they can request refills through [facility]  (Please schedule appointment if patient has not been seen in over a year)    WHAT PHARMACY WOULD THEY LIKE THIS SENT TO: [place], [place]: Albuterol inhaler  NOTES/COMMENTS FROM PATIENT: pt want her inhaler to be sent to [place], [place] office please notify patient: It takes 48-72 hours to process rx refill requests Ask patient to call pharmacy to ensure rx is ready before heading there.

## 2023-01-11 ENCOUNTER — Telehealth: Payer: Self-pay | Admitting: Family Medicine

## 2023-01-11 MED ORDER — ALPRAZOLAM 0.5 MG PO TABS: ORAL_TABLET | ORAL | 0 refills | Status: DC

## 2023-01-11 NOTE — Telephone Encounter (Signed)
Left VM stating Rx has been sent in

## 2023-01-11 NOTE — Telephone Encounter (Signed)
Encourage patient to contact the pharmacy for refills or they can request refills through [place]: [facility] [place] [place] [facility] 0.5 MG tablet   NOTES/COMMENTS FROM PATIENT:      [place] office please notify patient: It takes 48-72 hours to process rx refill requests Ask patient to call pharmacy to ensure rx is ready before heading there.

## 2023-01-11 NOTE — Telephone Encounter (Signed)
Prescription sent to requested pharmacy. 

## 2023-02-25 ENCOUNTER — Telehealth: Payer: Self-pay | Admitting: Family Medicine

## 2023-02-25 NOTE — Telephone Encounter (Signed)
Encourage patient to contact the pharmacy for refills or they can request refills through [employer]   WHAT PHARMACY WOULD THEY LIKE THIS SENT TO:  [facility] - [place], [place] - [address] [facility] - [place], [place] - [address]   MEDICATION NAME & DOSE:  albuterol (VENTOLIN HFA) 108 (90 Base) MCG/ACT inhaler  NOTES/COMMENTS FROM PATIENT:      Front office please notify patient: It takes 48-72 hours to process rx refill requests Ask patient to call pharmacy to ensure rx is ready before heading there.

## 2023-03-01 ENCOUNTER — Other Ambulatory Visit: Payer: Self-pay | Admitting: Family Medicine

## 2023-03-01 MED ORDER — ALPRAZOLAM 0.5 MG PO TABS: ORAL_TABLET | ORAL | 0 refills | Status: DC

## 2023-03-01 NOTE — Telephone Encounter (Signed)
Pt aware the Rx has been sent in

## 2023-03-01 NOTE — Telephone Encounter (Signed)
Encourage patient to contact the pharmacy for refills or they can request refills through [facility]   WHAT PHARMACY WOULD THEY LIKE THIS SENT TO: [id number] [place], [place] - [address]   MEDICATION NAME & DOSE:ALPRAZolam (XANAX) 0.5 MG tablet   NOTES/COMMENTS FROM PATIENT:      Front office please notify patient: It takes 48-72 hours to process rx refill requests Ask patient to call pharmacy to ensure rx is ready before heading there.    

## 2023-03-01 NOTE — Telephone Encounter (Signed)
Xanax 0.5 mg LOV: 10/25/22 Danielle Oneill /Refill:01/11/23 Upcoming appt: no apt

## 2023-04-24 ENCOUNTER — Other Ambulatory Visit: Payer: Self-pay | Admitting: Family Medicine

## 2023-04-24 NOTE — Telephone Encounter (Signed)
Xanax 0.5 mg LOV: 10/25/22 Last Refill:03/01/23 Upcoming appt: none

## 2023-04-24 NOTE — Telephone Encounter (Signed)
Left VM stating Rx has been sent in  

## 2023-06-19 ENCOUNTER — Other Ambulatory Visit: Payer: Self-pay | Admitting: Family Medicine

## 2023-06-20 NOTE — Telephone Encounter (Signed)
Patient is requesting a refill of the following medications: Requested Prescriptions   Pending Prescriptions Disp Refills   ALPRAZolam (XANAX) 0.5 MG tablet [Pharmacy Med Name: ALPRAZOLAM 0.5 MG TABLET] 120 tablet 0    Sig: TAKE 2 TABS BY MOUTH AT BEDTIME AS NEEDED FOR INSOMNIA & 1 TAB TWICE DAILY AS NEEDED FOR ANXIETY    Date of patient request: 06/19/23 Last office visit: 10/25/22 Date of last refill: 04/24/23 Last refill amount: 120 Follow up time period per chart: 1 year

## 2023-07-05 ENCOUNTER — Other Ambulatory Visit: Payer: Self-pay | Admitting: Family Medicine

## 2023-07-24 ENCOUNTER — Other Ambulatory Visit: Payer: Self-pay

## 2023-07-24 MED ORDER — ACYCLOVIR 200 MG PO CAPS: ORAL_CAPSULE | ORAL | 0 refills | Status: AC

## 2023-08-13 ENCOUNTER — Other Ambulatory Visit: Payer: Self-pay | Admitting: Family Medicine

## 2023-08-13 NOTE — Telephone Encounter (Signed)
Left vm to make pt aware.

## 2023-08-13 NOTE — Telephone Encounter (Signed)
Patient is requesting a refill of the following medications: Requested Prescriptions   Pending Prescriptions Disp Refills   ALPRAZolam (XANAX) 0.5 MG tablet [Pharmacy Med Name: ALPRAZOLAM 0.5 MG TABLET] 120 tablet 0    Sig: TAKE 2 TABS BY MOUTH AT BEDTIME AS NEEDED FOR INSOMNIA & 1 TAB TWICE DAILY AS NEEDED FOR ANXIETY    Date of patient request: 08/13/23 Last office visit: 10/25/22 Date of last refill: 06/20/23 Last refill amount: 120  Follow up time period per chart: 1 year

## 2023-08-13 NOTE — Telephone Encounter (Signed)
Last office visit 12/072023 Upcoming 10/31/2023 Last refill 06/20/2023

## 2023-09-06 ENCOUNTER — Telehealth: Payer: Self-pay

## 2023-09-06 LAB — HM MAMMOGRAPHY

## 2023-09-06 NOTE — Telephone Encounter (Addendum)
Patient is very distraught as she states she was medically induced into menopause several years ago and feels her medical history was not appropriately reviewed as her surgical history is also incorrect due to oophorectomy reported in 2009 and it was not done that year she was still having periods in 2010 Per patient "if she really cares about her patients she would actually review my chart"  She states she will also be calling GYN who performed her surgery and be getting those records sent so we have the correct information

## 2023-09-06 NOTE — Telephone Encounter (Signed)
We typically start these once someone is menopausal.  Often ordered by GYN but we can order at upcoming visit if needed

## 2023-09-06 NOTE — Telephone Encounter (Signed)
Patient was relieved to hear that the CDC recommendation was not till 78 she states she just really wants to maintain her health and she wonders if the gap in care may have been caused when her GYN left the practice almost immediately after her surgeries and notes she feels much better after having discussed this again and will discuss when she comes in in December    No further action

## 2023-09-06 NOTE — Telephone Encounter (Signed)
I am sorry she is upset.  In her surgical hx we have noted a L oophorectomy in 2009 and her other surgery done in 2017.  So we do have the correct information.  Unfortunately I try and answer these questions in between pt visits and do so to the best of my ability without fully reviewing each chart as there is not enough time to do so and still respond within an acceptable time period.  I apologize.  But yes, if she is post menopausal we can order a DEXA scan.  The CDC recommendation is 65 but I often do them sooner in postmenopausal women.

## 2023-09-06 NOTE — Telephone Encounter (Signed)
Pt asking if she should have a Bone density scan someone told her she should have one in her 8s   Please advise

## 2023-09-09 ENCOUNTER — Encounter: Payer: Self-pay | Admitting: Family Medicine

## 2023-09-17 ENCOUNTER — Other Ambulatory Visit: Payer: Self-pay | Admitting: Family Medicine

## 2023-10-15 ENCOUNTER — Other Ambulatory Visit: Payer: Self-pay

## 2023-10-15 MED ORDER — ALBUTEROL SULFATE HFA 108 (90 BASE) MCG/ACT IN AERS: 2.0000 | INHALATION_SPRAY | Freq: Four times a day (QID) | RESPIRATORY_TRACT | 2 refills | Status: DC | PRN

## 2023-10-31 ENCOUNTER — Ambulatory Visit (INDEPENDENT_AMBULATORY_CARE_PROVIDER_SITE_OTHER): Payer: 59 | Admitting: Family Medicine

## 2023-10-31 ENCOUNTER — Encounter: Payer: Self-pay | Admitting: Family Medicine

## 2023-10-31 VITALS — BP 102/68 | HR 74 | Temp 97.8°F | Ht 65.0 in

## 2023-10-31 DIAGNOSIS — Z0000 Encounter for general adult medical examination without abnormal findings: Secondary | ICD-10-CM | POA: Diagnosis not present

## 2023-10-31 DIAGNOSIS — E663 Overweight: Secondary | ICD-10-CM | POA: Diagnosis not present

## 2023-10-31 DIAGNOSIS — E559 Vitamin D deficiency, unspecified: Secondary | ICD-10-CM

## 2023-10-31 LAB — VITAMIN D 25 HYDROXY (VIT D DEFICIENCY, FRACTURES): VITD: 65.86 ng/mL (ref 30.00–100.00)

## 2023-10-31 LAB — CBC WITH DIFFERENTIAL/PLATELET
Basophils Absolute: 0 10*3/uL (ref 0.0–0.1)
Basophils Relative: 0.6 % (ref 0.0–3.0)
Eosinophils Absolute: 0.3 10*3/uL (ref 0.0–0.7)
Eosinophils Relative: 4.3 % (ref 0.0–5.0)
HCT: 41.5 % (ref 36.0–46.0)
Hemoglobin: 13.7 g/dL (ref 12.0–15.0)
Lymphocytes Relative: 24.3 % (ref 12.0–46.0)
Lymphs Abs: 1.9 10*3/uL (ref 0.7–4.0)
MCHC: 32.9 g/dL (ref 30.0–36.0)
MCV: 98.9 fL (ref 78.0–100.0)
Monocytes Absolute: 0.6 10*3/uL (ref 0.1–1.0)
Monocytes Relative: 7.2 % (ref 3.0–12.0)
Neutro Abs: 5 10*3/uL (ref 1.4–7.7)
Neutrophils Relative %: 63.6 % (ref 43.0–77.0)
Platelets: 309 10*3/uL (ref 150.0–400.0)
RBC: 4.2 Mil/uL (ref 3.87–5.11)
RDW: 14.1 % (ref 11.5–15.5)
WBC: 7.8 10*3/uL (ref 4.0–10.5)

## 2023-10-31 LAB — LIPID PANEL
Cholesterol: 204 mg/dL — ABNORMAL HIGH (ref 0–200)
HDL: 78.3 mg/dL (ref >39.00)
LDL Cholesterol: 104 mg/dL — ABNORMAL HIGH (ref 0–99)
NonHDL: 126
Total CHOL/HDL Ratio: 3
Triglycerides: 112 mg/dL (ref 0.0–149.0)
VLDL: 22.4 mg/dL (ref 0.0–40.0)

## 2023-10-31 LAB — BASIC METABOLIC PANEL
BUN: 19 mg/dL (ref 6–23)
CO2: 26 meq/L (ref 19–32)
Calcium: 8.7 mg/dL (ref 8.4–10.5)
Chloride: 103 meq/L (ref 96–112)
Creatinine, Ser: 0.63 mg/dL (ref 0.40–1.20)
GFR: 101.5 mL/min (ref >60.00)
Glucose, Bld: 95 mg/dL (ref 70–99)
Potassium: 4.1 meq/L (ref 3.5–5.1)
Sodium: 136 meq/L (ref 135–145)

## 2023-10-31 LAB — HEPATIC FUNCTION PANEL
ALT: 16 U/L (ref 0–35)
AST: 18 U/L (ref 0–37)
Albumin: 4 g/dL (ref 3.5–5.2)
Alkaline Phosphatase: 46 U/L (ref 39–117)
Bilirubin, Direct: 0.1 mg/dL (ref 0.0–0.3)
Total Bilirubin: 0.6 mg/dL (ref 0.2–1.2)
Total Protein: 6.9 g/dL (ref 6.0–8.3)

## 2023-10-31 LAB — TSH: TSH: 0.79 u[IU]/mL (ref 0.35–5.50)

## 2023-10-31 NOTE — Assessment & Plan Note (Signed)
Pt's PE WNL.  UTD on pap, mammo, colonoscopy, Tdap.  Declines flu.  Check labs.  Anticipatory guidance provided.  

## 2023-10-31 NOTE — Progress Notes (Signed)
   Subjective:    Patient ID: Danielle Oneill, female    DOB: [birth year], 52 y.o.   MRN: [id number]  HPI CPE- UTD on pap, mammo, colonoscopy, Tdap.  Declines flu, HIV.  No concerns today.  Patient Care Team    Relationship Specialty Notifications Start End  Sheliah Hatch, MD PCP - General Family Medicine  04/02/11   Ilda Mori, NP  Nurse Practitioner  10/31/23      Health Maintenance  Topic Date Due   HIV Screening  Never done   INFLUENZA VACCINE  06/20/2023   COVID-19 Vaccine (5 - 2024-25 season) 07/21/2023   MAMMOGRAM  09/05/2024   Cervical Cancer Screening (HPV/Pap Cotest)  07/10/2026   Colonoscopy  05/10/2028   DTaP/Tdap/Td (3 - Td or Tdap) 10/21/2031   Hepatitis C Screening  Completed   Zoster Vaccines- Shingrix  Completed   HPV VACCINES  Aged Out     Review of Systems Patient reports no vision/ hearing changes, adenopathy,fever, weight change,  persistant/recurrent hoarseness , swallowing issues, chest pain, palpitations, edema, persistant/recurrent cough, hemoptysis, dyspnea (rest/exertional/paroxysmal nocturnal), gastrointestinal bleeding (melena, rectal bleeding), abdominal pain, significant heartburn, bowel changes, GU symptoms (dysuria, hematuria, incontinence), Gyn symptoms (abnormal  bleeding, pain),  syncope, focal weakness, memory loss, numbness & tingling, skin/hair/nail changes, abnormal bruising or bleeding, anxiety, or depression.     Objective:   Physical Exam General Appearance:    Alert, cooperative, no distress, appears stated age  Head:    Normocephalic, without obvious abnormality, atraumatic  Eyes:    PERRL, conjunctiva/corneas clear, EOM's intact both eyes  Ears:    Normal TM's and external ear canals, both ears  Nose:   Nares normal, septum midline, mucosa normal, no drainage    or sinus tenderness  Throat:   Lips, mucosa, and tongue normal; teeth and gums normal  Neck:   Supple, symmetrical, trachea midline, no adenopathy;    Thyroid: no  enlargement/tenderness/nodules  Back:     Symmetric, no curvature, ROM normal, no CVA tenderness  Lungs:     Clear to auscultation bilaterally, respirations unlabored  Chest Wall:    No tenderness or deformity   Heart:    Regular rate and rhythm, S1 and S2 normal, no murmur, rub   or gallop  Breast Exam:    Deferred to GYN  Abdomen:     Soft, non-tender, bowel sounds active all four quadrants,    no masses, no organomegaly  Genitalia:    Deferred to GYN  Rectal:    Extremities:   Extremities normal, atraumatic, no cyanosis or edema  Pulses:   2+ and symmetric all extremities  Skin:   Skin color, texture, turgor normal, no rashes or lesions  Lymph nodes:   Cervical, supraclavicular, and axillary nodes normal  Neurologic:   CNII-XII intact, normal strength, sensation and reflexes    throughout          Assessment & Plan:

## 2023-10-31 NOTE — Patient Instructions (Signed)
Follow up in year or as needed We'll notify you of your lab results and make any changes if needed Keep up the good work on healthy diet and regular exercise- you look great! Call with any questions or concerns Stay Safe!  Stay Healthy! Happy Holidays!!!

## 2023-10-31 NOTE — Assessment & Plan Note (Signed)
Check labs and replete prn. 

## 2023-11-01 ENCOUNTER — Telehealth: Payer: Self-pay

## 2023-11-01 NOTE — Telephone Encounter (Signed)
-----   Message from Neena Rhymes sent at 11/01/2023  7:39 AM EST ----- Labs look great!  No changes at this time

## 2023-11-01 NOTE — Telephone Encounter (Signed)
Patient is aware of labs, no questions at this time.

## 2023-11-04 ENCOUNTER — Other Ambulatory Visit: Payer: Self-pay | Admitting: Family Medicine

## 2023-11-04 NOTE — Telephone Encounter (Signed)
Encourage patient to contact the pharmacy for refills or they can request refills through [facility]  WHAT PHARMACY WOULD THEY LIKE THIS SENT TO:  [facility] [place], [place] - [address]   MEDICATION NAME & DOSE: ALPRAZolam (XANAX) 0.5 MG tablet   NOTES/COMMENTS FROM PATIENT:  Encourage patient to contact the pharmacy for refills or they can request refills through [facility]    WHAT PHARMACY WOULD THEY LIKE THIS SENT TO:  [facility] - [place], [place] - [address]   MEDICATION NAME & DOSE: Fluticasone-Salmeterol Fluticasone-Salmeterol (ADVAIR DISKUS) 100-50 MCG/DOSE AEPB   NOTES/COMMENTS FROM PATIENT: Pt states at last visit she spoke to you about this med.       Front office please notify patient: It takes 48-72 hours to process rx refill requests Ask patient to call pharmacy to ensure rx is ready before heading there.      Front office please notify patient: It takes 48-72 hours to process rx refill requests Ask patient to call pharmacy to ensure rx is ready before heading there.

## 2023-11-04 NOTE — Telephone Encounter (Signed)
Requested Prescriptions   Pending Prescriptions Disp Refills   ALPRAZolam (XANAX) 0.5 MG tablet 120 tablet 0    Sig: TAKE 2 TABS BY MOUTH AT BEDTIME AS NEEDED FOR INSOMNIA & 1 TAB TWICE DAILY AS NEEDED FOR ANXIETY     Date of patient request: 11/04/2023 Last office visit: 10/31/2023 Upcoming visit: Visit date not found Date of last refill: 09/18/2023 Last refill amount: 120

## 2023-11-05 MED ORDER — ALPRAZOLAM 0.5 MG PO TABS: ORAL_TABLET | ORAL | 0 refills | Status: DC

## 2023-11-05 NOTE — Telephone Encounter (Signed)
Called and LM "Prescription requested yesterday has been sent in"

## 2023-11-15 ENCOUNTER — Other Ambulatory Visit: Payer: Self-pay | Admitting: Family Medicine

## 2023-11-15 NOTE — Telephone Encounter (Signed)
Copied from CRM [id number]. Topic: Clinical - Medication Refill >> Nov 15, 2023 12:30 PM Danielle Oneill wrote: Most Recent Primary Care Visit:  Provider: Sheliah Hatch  Department: [facility]  Visit Type: PHYSICAL  Date: 10/31/2023  Medication: Fluticasone-Salmeterol (ADVAIR DISKUS) 100-50 MCG/DOSE AEPB   Has the patient contacted their pharmacy? Yes (Agent: If no, request that the patient contact the pharmacy for the refill. If patient does not wish to contact the pharmacy document the reason why and proceed with request.) (Agent: If yes, when and what did the pharmacy advise?)  Is this the correct pharmacy for this prescription? Yes If no, delete pharmacy and type the correct one.  This is the patient's preferred pharmacy:   [facility] - [place], [place] - [address] [place] [address] Phone: [phone] Fax: [phone]   Has the prescription been filled recently? No  Is the patient out of the medication? Yes  Has the patient been seen for an appointment in the last year OR does the patient have an upcoming appointment? Yes  Can we respond through MyChart? Yes  Agent: Please be advised that Rx refills may take up to 3 business days. We ask that you follow-up with your pharmacy.

## 2023-11-18 ENCOUNTER — Other Ambulatory Visit: Payer: Self-pay

## 2023-11-18 ENCOUNTER — Ambulatory Visit: Payer: 59 | Admitting: Family Medicine

## 2023-11-18 MED ORDER — FLUTICASONE-SALMETEROL 100-50 MCG/ACT IN AEPB: 1.0000 | INHALATION_SPRAY | Freq: Two times a day (BID) | RESPIRATORY_TRACT | 3 refills | Status: DC

## 2023-11-18 NOTE — Telephone Encounter (Signed)
Copied from CRM [id number]. Topic: Clinical - Prescription Issue >> Nov 18, 2023  8:58 AM Orinda Kenner C wrote: Reason for CRM: Pt is really frustrated that she's contact the office many times for fluticasone-salmeterol (ADVAIR) 100-50 MCG/ACT AEPB rf. Pt has not received rx, pls c/b [phone]. Pt wants Dr. Beverely Low, pt has been a long time pt and is very upset and doesn't understand why it's so difficult to get a hold of Dr. Beverely Low, she feels like black-list. Pt has met her deductible and needs to have this med refill. It's cold outside and the med is helpful. Pt is [id number] [place], [place] - [address] [place] [address] Phone:[phone] because she needs it now. And then also to [facility]). Pt has to work today, if she doesn't answer pls leave a message.

## 2023-11-18 NOTE — Telephone Encounter (Signed)
Prescription was sent to local pharmacy in [place].  I apologize for the delay, the first message I have regarding this prescription was on Friday 12/27 and I was out of the office.  This is the first opportunity I have had to respond.  This is why it mentions it can take up to 3 business days to fill medications.  No one is black listed but given the size of my patient panel, I do not call patients directly.  If I did, I would have no time to see the people in the office.  We are also implementing a new call center and there have definitely been some bumps in the road.  Please be patient with [place] as we navigate this.

## 2023-11-18 NOTE — Telephone Encounter (Signed)
Pt is upset that this has been delayed, looks like a historical provider, please advise if this is appropriate.  Patient also requesting a call from you directly

## 2023-12-04 ENCOUNTER — Telehealth: Payer: Self-pay | Admitting: Family Medicine

## 2023-12-04 ENCOUNTER — Telehealth (INDEPENDENT_AMBULATORY_CARE_PROVIDER_SITE_OTHER): Payer: 59 | Admitting: Family Medicine

## 2023-12-04 ENCOUNTER — Encounter: Payer: Self-pay | Admitting: Family Medicine

## 2023-12-04 DIAGNOSIS — E663 Overweight: Secondary | ICD-10-CM | POA: Diagnosis not present

## 2023-12-04 NOTE — Progress Notes (Signed)
 Virtual Visit via Video   I connected with patient on 12/04/23 at  8:20 AM EST by a video enabled telemedicine application and verified that I am speaking with the correct person using two identifiers.  Location patient: Home Location provider: [place], Office Persons participating in the virtual visit: Patient, Provider, CMA Lynnie Saucier H)  I discussed the limitations of evaluation and management by telemedicine and the availability of in person appointments. The patient expressed understanding and agreed to proceed.  Subjective:   HPI:   Weight loss- pt has friends at work who are on Ozempic and have had good results.  Pt reports she exercises regularly, eats well but feels that she is stuck.  Pt's BMI 29.  ROS:   See pertinent positives and negatives per HPI.  Patient Active Problem List   Diagnosis Date Noted   Overweight (BMI 25.0-29.9) 06/12/2019   Vitamin D  deficiency 06/06/2018   Migraine headache with aura 09/27/2017   Patellar tendonitis 08/21/2012   Pre-syncope 06/26/2012   General medical examination 11/05/2011   DYSMENORRHEA 09/14/2009   Insomnia 09/14/2009   Generalized anxiety disorder 10/24/2007   Genital herpes 06/03/2007   Asthma 06/03/2007    Social History   Tobacco Use   Smoking status: Never   Smokeless tobacco: Never  Substance Use Topics   Alcohol use: Yes    Comment: occ. wine    Current Outpatient Medications:    acyclovir  (ZOVIRAX ) 200 MG capsule, Take 1 capsule by mouth 4 times daily with outbreak, Disp: 360 capsule, Rfl: 0   albuterol  (VENTOLIN  HFA) 108 (90 Base) MCG/ACT inhaler, Inhale 2 puffs into the lungs every 6 (six) hours as needed for wheezing or shortness of breath., Disp: 8 g, Rfl: 2   alclomethasone (ACLOVATE) 0.05 % cream, 1 application 2 (two) times daily., Disp: , Rfl:    ALPRAZolam  (XANAX ) 0.5 MG tablet, TAKE 2 TABS BY MOUTH AT BEDTIME AS NEEDED FOR INSOMNIA & 1 TAB TWICE DAILY AS NEEDED FOR ANXIETY, Disp: 120  tablet, Rfl: 0   ARMOUR THYROID  60 MG tablet, SMARTSIG:1.0 Tablet(s) By Mouth Daily, Disp: , Rfl:    BIOTIN PO, Take 1 tablet by mouth daily., Disp: , Rfl:    Calcium-Magnesium-Vitamin D  (CALCIUM MAGNESIUM PO), Take by mouth daily., Disp: , Rfl:    Ergocalciferol  (VITAMIN D2) 2000 units TABS, Take 1 tablet by mouth daily., Disp: , Rfl:    escitalopram  (LEXAPRO ) 20 MG tablet, TAKE 1 TABLET BY MOUTH DAILY, Disp: 90 tablet, Rfl: 3   fluticasone -salmeterol (ADVAIR) 100-50 MCG/ACT AEPB, Inhale 1 puff into the lungs 2 (two) times daily., Disp: 60 each, Rfl: 3   Multiple Vitamin (MULTIVITAMIN) tablet, Take 1 tablet by mouth daily., Disp: , Rfl:    Multiple Vitamins-Minerals (EMERGEN-C IMMUNE PO), Take by mouth., Disp: , Rfl:    scopolamine  (TRANSDERM-SCOP) 1 MG/3DAYS, Place 1 patch (1.5 mg total) onto the skin every 3 (three) days., Disp: 4 patch, Rfl: 1  No Known Allergies  Objective:   LMP 01/30/2016  AAOx3, NAD NCAT, EOMI No obvious CN deficits Coloring WNL Pt is able to speak clearly, coherently without shortness of breath or increased work of breathing.  Thought process is linear.  Mood is appropriate.   Assessment and Plan:   Overweight- ongoing issue for pt.  Talked about the indication for Ozempic for weight loss- BMI >30 or >27 w/ medical comorbidity.  At this time she doesn't qualify.  Discussed pay out of pocket option but this is not something she  can do right now.  Biggest deterrent was the side effect profile- particularly the nausea and vomiting.  At this time, pt elects to continue working on healthy diet and regular exercise.   Laymon Priest, MD 12/04/2023

## 2023-12-04 NOTE — Telephone Encounter (Signed)
 Called and LM to ask how we can help, left call back number

## 2023-12-04 NOTE — Telephone Encounter (Signed)
 Copied from CRM [id number]. Topic: General - Call Back - No Documentation >> Dec 03, 2023  2:24 PM Adonis Hoot wrote: Reason for CRM: Patient returned call back to sonya regarding an appointment that she has.

## 2023-12-04 NOTE — Telephone Encounter (Signed)
Pt had appt today with PCP

## 2024-01-30 ENCOUNTER — Other Ambulatory Visit: Payer: Self-pay | Admitting: Family Medicine

## 2024-01-30 NOTE — Telephone Encounter (Signed)
 Copied from CRM [id number]. Topic: Clinical - Medication Refill >> Jan 30, 2024  9:11 AM Truddie Crumble wrote: Most Recent Primary Care Visit:  Provider: Sheliah Hatch  Department: [facility]  Visit Type: MYCHART VIDEO VISIT  Date: 12/04/2023  Medication: ALPRAZolam Prudy Feeler) 0.5 MG tablet  Has the patient contacted their pharmacy? Yes (Agent: If no, request that the patient contact the pharmacy for the refill. If patient does not wish to contact the pharmacy document the reason why and proceed with request.) (Agent: If yes, when and what did the pharmacy advise?)  Is this the correct pharmacy for this prescription? Yes If no, delete pharmacy and type the correct one.  This is the patient's preferred pharmacy:  [facility] [place], [place] - [address] [place] [address] Phone: [phone] Fax: [phone]   Has the prescription been filled recently? No  Is the patient out of the medication? Yes  Has the patient been seen for an appointment in the last year OR does the patient have an upcoming appointment? Yes  Can we respond through MyChart? Yes  Agent: Please be advised that Rx refills may take up to 3 business days. We ask that you follow-up with your pharmacy.

## 2024-01-30 NOTE — Telephone Encounter (Signed)
 Requested Prescriptions   Pending Prescriptions Disp Refills   ALPRAZolam (XANAX) 0.5 MG tablet [Pharmacy Med Name: ALPRAZOLAM 0.5 MG TABLET] 120 tablet 0    Sig: TAKE 2 TABS BY MOUTH AT BEDTIME AS NEEDED FOR INSOMNIA & 1 TAB TWICE DAILY AS NEEDED FOR ANXIETY     Date of patient request: 01/30/2024 Last office visit: 10/31/2023 Upcoming visit: Visit date not found Date of last refill: 11/05/2023 Last refill amount: 120

## 2024-02-18 ENCOUNTER — Other Ambulatory Visit: Payer: Self-pay | Admitting: Family Medicine

## 2024-03-23 ENCOUNTER — Other Ambulatory Visit: Payer: Self-pay | Admitting: Family Medicine

## 2024-03-23 MED ORDER — ALPRAZOLAM 0.5 MG PO TABS: ORAL_TABLET | ORAL | 0 refills | Status: DC

## 2024-03-23 NOTE — Telephone Encounter (Signed)
 Requested Prescriptions   Pending Prescriptions Disp Refills   ALPRAZolam  (XANAX ) 0.5 MG tablet 120 tablet 0    Sig: TAKE 2 TABS BY MOUTH AT BEDTIME AS NEEDED FOR INSOMNIA & 1 TAB TWICE DAILY AS NEEDED FOR ANXIETY     Date of patient request: 03/23/2024 Last office visit: 10/31/2023 Upcoming visit: Visit date not found Date of last refill: 01/30/2024 Last refill amount: 120

## 2024-03-23 NOTE — Telephone Encounter (Signed)
 Copied from CRM [id number]. Topic: Clinical - Medication Refill >> Mar 23, 2024  9:03 AM Varney Gentleman wrote: Most Recent Primary Care Visit:  Provider: TABORI, KATHERINE E  Department: [facility]  Visit Type: MYCHART VIDEO VISIT  Date: 12/04/2023  Medication: ALPRAZolam  (XANAX ) 0.5 MG tablet  Has the patient contacted their pharmacy? Yes, no refills (Agent: If no, request that the patient contact the pharmacy for the refill. If patient does not wish to contact the pharmacy document the reason why and proceed with request.) (Agent: If yes, when and what did the pharmacy advise?)  Is this the correct pharmacy for this prescription? Yes If no, delete pharmacy and type the correct one.  This is the patient's preferred pharmacy:  [facility] [place], [place] - [address] [place] [address] Phone: [phone] Fax: [phone]    Has the prescription been filled recently? No  Is the patient out of the medication? No  Has the patient been seen for an appointment in the last year OR does the patient have an upcoming appointment? Yes  Can we respond through MyChart? Yes  Agent: Please be advised that Rx refills may take up to 3 business days. We ask that you follow-up with your pharmacy.

## 2024-05-04 ENCOUNTER — Other Ambulatory Visit: Payer: Self-pay | Admitting: Family Medicine

## 2024-05-04 NOTE — Telephone Encounter (Signed)
 Requested Prescriptions   Pending Prescriptions Disp Refills   ALPRAZolam  (XANAX ) 0.5 MG tablet [Pharmacy Med Name: ALPRAZOLAM  0.5 MG TABLET] 120 tablet 0    Sig: TAKE 2 TABS BY MOUTH AT BEDTIME AS NEEDED FOR INSOMNIA & 1 TAB TWICE DAILY AS NEEDED FOR ANXIETY     Date of patient request: 05/04/2024 Last office visit: 10/31/2023 Upcoming visit: Visit date not found Date of last refill: 03/23/2024 Last refill amount: 120

## 2024-05-20 ENCOUNTER — Other Ambulatory Visit: Payer: Self-pay | Admitting: Family Medicine

## 2024-05-27 ENCOUNTER — Telehealth: Payer: Self-pay | Admitting: Family Medicine

## 2024-05-27 NOTE — Telephone Encounter (Signed)
 Called pt to get scheduled w/ a provider for evaluation. Patient stated she lives 40 minutes away and it hurts to drive so she would opt for an virtual visit if possible. States she was evaluated and dx with sprained knee on 05/23/24 by [facility]. I spoke with CMA, Jacquline who suggested having patient ask if [facility] could place the referral and if not, then get scheduled a vv w/ available provider. I informed patient of this and she verbalized understanding. Patient has been scheduled vv w/ Dr. Jerrell on 05/28/24 @ 2:20pm. Prior to ending call, I recommended patient also ask [facility] to fax over visit notes from 7/5, pt verbalized understanding.

## 2024-05-27 NOTE — Telephone Encounter (Signed)
 Copied from CRM [id number]. Topic: Referral - Request for Referral >> May 27, 2024 10:48 AM Danielle Oneill wrote:   Did the patient discuss referral with their provider in the last year? Yes (If No - schedule appointment) (If Yes - send message)  Appointment offered? No  Type of order/referral and detailed reason for visit: Orthopedic provider- in regards to left knee sprain   Preference of office, provider, location: [place] if possible   If referral order, have you been seen by this specialty before? No (If Yes, this issue or another issue? When? Where?  Can we respond through MyChart? Yes

## 2024-05-27 NOTE — Telephone Encounter (Signed)
 Patient will need appt, we have not seen her since January and the appt at the time did not pertain to knee pain

## 2024-05-28 ENCOUNTER — Telehealth (INDEPENDENT_AMBULATORY_CARE_PROVIDER_SITE_OTHER): Admitting: Student in an Organized Health Care Education/Training Program

## 2024-05-28 DIAGNOSIS — M25562 Pain in left knee: Secondary | ICD-10-CM | POA: Diagnosis not present

## 2024-05-28 NOTE — Patient Instructions (Signed)
  VISIT SUMMARY: You came in today due to left knee pain and instability following a squat exercise. You have been experiencing this since May 12, 2024, and it has significantly impacted your daily activities. An x-ray showed no fractures, but you continue to have pain, swelling, and instability.  YOUR PLAN: -LEFT KNEE PAIN WITH SUSPECTED MENISCAL TEAR: Your left knee pain and swelling, along with the instability, suggest a possible meniscal tear. A meniscal tear is a common knee injury that affects the cartilage. We need to confirm this with an MRI and rule out any ligament tears. We also offered to aspirate the knee joint to analyze the fluid. You should avoid weight-bearing activities and strenuous exercise. Use ibuprofen  for pain management as needed, but not daily. Continue icing and elevating your knee.  INSTRUCTIONS: Please schedule an MRI for your left knee. Consider the knee joint aspiration we discussed. Follow up with an orthopedic specialist for further evaluation.

## 2024-05-28 NOTE — Progress Notes (Signed)
 Acute Video Visit  I connected with  Danielle Oneill on 05/28/24 by a video enabled telemedicine application and verified that I am speaking with the correct person using two identifiers.   I discussed the limitations of evaluation and management by telemedicine. The patient expressed understanding and agreed to proceed.   Subjective:     Patient ID: Danielle Oneill, female    DOB: 24-Mar-1971, 53 y.o.   MRN: [id number]  Chief Complaint  Patient presents with   Knee Injury    Patient states a couple weeks ago was doing squats and then had pain and knee gave out. Very swollen and very painful. Was seen by [facility] for this and patient did signs papers to get those records sent here. Referral for Ortho in [place]. Very painful and hard to walk along with a lot of swelling.     HPI  Discussed the use of AI scribe software for clinical note transcription with the patient, who gave verbal consent to proceed.  History of Present Illness Danielle Oneill is a 53 year old female who presents with left knee pain and instability following a squat exercise. She is accompanied by her husband.  She has been experiencing left knee pain and instability since May 12, 2024, after performing a deep squat exercise. The pain is localized on the top of the knee, near the patella, and is associated with significant swelling and a noticeable lump. The pain began suddenly and has resulted in an inability to bear weight on the left knee, causing a limp and difficulty with daily activities such as walking and climbing stairs.  Conservative management including icing, elevation, and rest has been attempted without significant improvement. She has taken Advil  occasionally for discomfort but not consistently, as she describes the sensation more as discomfort rather than pain. No fevers or knee locking have been experienced, but the knee has given out on multiple occasions, leading to instability.  An x-ray performed  on May 23, 2024, showed no fractures. She received a shot at a clinic and was advised to take Advil  and continue icing the knee.  She is frustrated with her current limitations, as she is typically very active and finds it difficult to refrain from exercise. She is concerned about the impact on her daily life and ability to work, as she is currently unable to perform her usual activities without significant difficulty.        Objective:    Physical Exam  Gen: Well-appearing woman MSK: Left knee has moderate sized swelling consistent with a joint effusion, no obvious bruising or deformity Psych: Moderately anxious appearing, not depressed appearing     Assessment & Plan:    Problem List Items Addressed This Visit       Unprioritized   Acute pain of left knee - Primary   Left knee pain and swelling with instability, likely meniscal tear. X-ray negative for fractures. MRI needed to confirm diagnosis and rule out ligament tear. Aspiration offered to analyze joint fluid. Advised on recovery time and pain management. - Order MRI of the left knee. - Offered knee joint aspiration for fluid analysis to help distinguish traumatic effusion versus inflammatory. - Refer to orthopedics for further evaluation.  - Advise to avoid weight-bearing activities and strenuous exercise. - Instruct to use ibuprofen  for pain management as needed, not daily. - Advise continued icing and elevation of the knee.  - Too early in the injury to offer her physical therapy      Relevant  Orders   MR Knee Left  Wo Contrast   Ambulatory referral to Orthopedic Surgery    Return if symptoms worsen or fail to improve.  Cleatus Debby Specking, MD

## 2024-05-28 NOTE — Assessment & Plan Note (Signed)
 Left knee pain and swelling with instability, likely meniscal tear. X-ray negative for fractures. MRI needed to confirm diagnosis and rule out ligament tear. Aspiration offered to analyze joint fluid. Advised on recovery time and pain management. - Order MRI of the left knee. - Offered knee joint aspiration for fluid analysis to help distinguish traumatic effusion versus inflammatory. - Refer to orthopedics for further evaluation.  - Advise to avoid weight-bearing activities and strenuous exercise. - Instruct to use ibuprofen  for pain management as needed, not daily. - Advise continued icing and elevation of the knee.  - Too early in the injury to offer her physical therapy

## 2024-06-01 ENCOUNTER — Ambulatory Visit: Payer: Self-pay

## 2024-06-01 ENCOUNTER — Ambulatory Visit: Payer: Self-pay | Admitting: *Deleted

## 2024-06-01 ENCOUNTER — Telehealth: Payer: Self-pay

## 2024-06-01 ENCOUNTER — Ambulatory Visit: Admitting: Rehabilitative and Restorative Service Providers"

## 2024-06-01 NOTE — Telephone Encounter (Signed)
 Husband contacted this RN regarding wife, verbal permission received from wife to speak with husband Danielle Oneill.  Danielle Oneill is requesting assistance scheduling Danielle Oneill's MRI as he has been unable to secure an appointment at locations he has contacted.   Advised this RN would message office to request they reach out.

## 2024-06-01 NOTE — Telephone Encounter (Signed)
  Reason for Disposition  [1] Can't move swollen joint at all AND [2] no fever  Answer Assessment - Initial Assessment Questions 1. LOCATION and RADIATION: Where is the pain located?      Left knee- swelling with pain 2. QUALITY: What does the pain feel like?  (e.g., sharp, dull, aching, burning)     Severe pain 3. SEVERITY: How bad is the pain? What does it keep you from doing?   (Scale 1-10; or mild, moderate, severe)     Unable to walk- drive- having numbness in hands and feet 4. ONSET: When did the pain start? Does it come and go, or is it there all the time?     3 weeks Patient is crying and extremely upset- she states the female provider that she saw was supposed to order an MRI and instead sent her to rehab/PT which she states she does not need and even rehab asked why she was there. Patient states now she has to go to ED due to her pain and swelling- she is extremely upset. I did apologize to patient and I let her know I would make sure Dr Mahlon was aware. Patient state sshe has to work 40 hours/week and can not keep missing work- she has been misdiagnosed and is upset with her care. It looks like MRI was ordered 05/28/24- but patient was so upset- I did not have opportunity to investigate.  Protocols used: Knee Pain-A-AH    Copied from CRM [id number]. Topic: Clinical - Red Word Triage >> Jun 01, 2024  2:39 PM Gennette ORN wrote: Red Word that prompted transfer to Nurse Triage: Patient is having alot knee pain  ,her  hands are numb and feet numb for  3 weeks this occured during excerising and it's swollen as well her left knee.

## 2024-06-01 NOTE — Telephone Encounter (Signed)
 Hi [name].  Can you look into this orthopedics referral?  The patient requested an office that was close to [place].  Something was scheduled but now seems like it may need a change.

## 2024-06-01 NOTE — Telephone Encounter (Signed)
 Copied from CRM [id number]. Topic: Referral - Question >> Jun 01, 2024  2:13 PM Chasity T wrote: Reason for CRM: Verneita from  [facility] is calling to confirm the referral orders Dr Jerrell has placed for patient. She is calling the patient to let her know not to come to office because she will need a new referral for an orthopedic surgery. Their office only does PT.

## 2024-06-01 NOTE — Telephone Encounter (Signed)
 Okay to place new referral? Do you have another clinic you would like her to see?

## 2024-06-01 NOTE — Telephone Encounter (Signed)
 FYI, patient will be made a Hospital follow up on discharge.

## 2024-06-01 NOTE — Telephone Encounter (Signed)
  [facility] disconnected prior to transferring patient, attempt made to reach patient. No answer and left voicemail.  Copied from CRM [id number]. Topic: Clinical - Red Word Triage >> Jun 01, 2024 11:04 AM Henretta I wrote: Red Word that prompted transfer to Nurse Triage: Patient is having knee pain, its swollen, and tingling

## 2024-06-01 NOTE — Telephone Encounter (Signed)
 Patient was in car with husband and states she is going to ED- he states he is taking her to [facility].

## 2024-06-01 NOTE — Telephone Encounter (Signed)
 Can you assist with getting this pt scheduled?

## 2024-06-02 ENCOUNTER — Telehealth: Payer: Self-pay | Admitting: Student in an Organized Health Care Education/Training Program

## 2024-06-02 NOTE — Telephone Encounter (Signed)
 Spoke to spouse, Marcey. Gave him the number to schedule the MRI, he said they will probably wait until the swelling goes down to schedule. Advised him I left Shakeisha a voicemail message yesterday with MRI scheduling number as well as the number to Dr. Lonni Lout, orthopedic surgeon, to schedule an appointment.

## 2024-06-02 NOTE — Telephone Encounter (Signed)
 Left pt a Voicemail message letting patient know  referral was sent to Lonni Lout, MD: Orthopedic Surgeon at [facility] at [facility] [address] [place], [place] [address] Phone: [phone] Requested she call today to schedule her appointment. Also provided my direct number for any further assistance.

## 2024-06-02 NOTE — Telephone Encounter (Signed)
 Referral redirect to orthopedic surgeon in [place]. Called and spoke to patient. Also created a telephone encounter routed to you with this information.

## 2024-06-04 ENCOUNTER — Ambulatory Visit: Attending: Orthopedic Surgery

## 2024-06-04 ENCOUNTER — Other Ambulatory Visit: Payer: Self-pay

## 2024-06-04 DIAGNOSIS — R600 Localized edema: Secondary | ICD-10-CM | POA: Diagnosis present

## 2024-06-04 DIAGNOSIS — M6281 Muscle weakness (generalized): Secondary | ICD-10-CM | POA: Diagnosis present

## 2024-06-04 DIAGNOSIS — R262 Difficulty in walking, not elsewhere classified: Secondary | ICD-10-CM | POA: Diagnosis present

## 2024-06-04 DIAGNOSIS — M25562 Pain in left knee: Secondary | ICD-10-CM | POA: Insufficient documentation

## 2024-06-04 NOTE — Therapy (Signed)
 OUTPATIENT PHYSICAL THERAPY LOWER EXTREMITY EVALUATION   Patient Name: Danielle Oneill MRN: [id number] DOB:[birth year], 53 y.o., female Today's Date: 06/04/2024  END OF SESSION:  PT End of Session - 06/04/24 1057     Visit Number 1    Number of Visits 17    Date for PT Re-Evaluation 08/01/24    Authorization Type [employer]    PT Start Time 1018    PT Stop Time 1100    PT Time Calculation (min) 42 min    Activity Tolerance Patient tolerated treatment well    Behavior During Therapy WFL for tasks assessed/performed          Past Medical History:  Diagnosis Date   Anxiety    Arthritis    elbows from accident age 18 per Ortho    Asthma    Depression    Endometriosis    GERD (gastroesophageal reflux disease)    OCC - rare    Past Surgical History:  Procedure Laterality Date   OOPHORECTOMY  2009   left   ORTHOPEDIC SURGERY     shattered both elbows and left wrist, age 55   PARTIAL HYSTERECTOMY     ROBOTIC ASSISTED SALPINGO OOPHERECTOMY Right 02/10/2016   Procedure: ROBOTIC ASSISTED RIGHT SALPINGO OOPHORECTOMY LYSIS OF ADHESIONS TREATMENT OF ENDOMETRIOSIS, MYOMECTOMY;  Surgeon: Marie-Lyne Lavoie, MD;  Location: [facility];  Service: Gynecology;  Laterality: Right;   Patient Active Problem List   Diagnosis Date Noted   Acute pain of left knee 05/28/2024   Overweight (BMI 25.0-29.9) 06/12/2019   Vitamin D  deficiency 06/06/2018   Migraine headache with aura 09/27/2017   Patellar tendonitis 08/21/2012   Pre-syncope 06/26/2012   General medical examination 11/05/2011   DYSMENORRHEA 09/14/2009   Insomnia 09/14/2009   Generalized anxiety disorder 10/24/2007   Genital herpes 06/03/2007   Asthma 06/03/2007    PCP: Mahlon Comer BRAVO, MD   REFERRING PROVIDER: Huey Redell Fallow, PA-C  REFERRING DIAG: [phone] (ICD-10-CM) - Knee pain   THERAPY DIAG:  Acute pain of left knee  Muscle weakness (generalized)  Difficulty in walking, not elsewhere classified  Localized  edema  Rationale for Evaluation and Treatment: Rehabilitation  ONSET DATE: June 2025   SUBJECTIVE:   SUBJECTIVE STATEMENT: Patient reports about 3-4 weeks ago she was completing a deep squat and her Lt knee gave away. It hurt a little bit immediately, but progressively worsened over the next 2-3 weeks. She saw urgent care due to the swelling and then had video visit with primary care who thought she needed the knee aspirated. She then saw ortho and received prednisone  and knee brace (did not have the knee aspirated). She is very active and has been unable to participate in her typical exercise regimen due to the knee pain. She denies any popping/clicking. Occasional instances of the knee giving away.   PERTINENT HISTORY: None  PAIN:  Are you having pain? Yes: NPRS scale: none currently; at worst 6  Pain location: Lt anterior knee  Pain description: ache Aggravating factors: prolonged standing/walking, squats, stairs Relieving factors: rest   PRECAUTIONS: None  RED FLAGS: None   WEIGHT BEARING RESTRICTIONS: No  FALLS:  Has patient fallen in last 6 months? No  LIVING ENVIRONMENT: Lives with: lives with their spouse Lives in: House/apartment Stairs: Yes: Internal: flight steps; can reach both Has following equipment at home: None  OCCUPATION: works for [employer]   PLOF: Independent  PATIENT GOALS: be able to return to workout routine- fast walking, rucking   NEXT  MD VISIT: nothing scheduled   OBJECTIVE:  Note: Objective measures were completed at Evaluation unless otherwise noted.  DIAGNOSTIC FINDINGS:  none on file   PATIENT SURVEYS:  LEFS: 39/80  COGNITION: Overall cognitive status: Within functional limits for tasks assessed     SENSATION: Not tested  EDEMA:  Mild swelling about Lt knee   MUSCLE LENGTH: Hamstrings: WNL   POSTURE: No Significant postural limitations  PALPATION: TTP Lt patellar tendon   LOWER EXTREMITY ROM:  Active ROM  Right eval Left eval  Hip flexion    Hip extension    Hip abduction    Hip adduction    Hip internal rotation    Hip external rotation    Knee flexion  WNL  Knee extension  WNL  Ankle dorsiflexion 8 4  Ankle plantarflexion    Ankle inversion    Ankle eversion     (Blank rows = not tested)  LOWER EXTREMITY MMT:  MMT Right eval Left eval  Hip flexion 5 5  Hip extension 4+  4-  Hip abduction 4+ 4-  Hip adduction    Hip internal rotation    Hip external rotation    Knee flexion  4  Knee extension  5  Ankle dorsiflexion    Ankle plantarflexion    Ankle inversion    Ankle eversion     (Blank rows = not tested)  LOWER EXTREMITY SPECIAL TESTS:  McMurray's (-)  Ely (+ LLE)  Thessaly (-)   FUNCTIONAL TESTS:  Squat: Mild Rt hip shift   GAIT: Distance walked: 15 ft  Assistive device utilized: None Level of assistance: Complete Independence Comments: no obvious gait abnormalities                                                                                                                                 [facility] Adult PT Treatment:                                                DATE: 06/04/24 Therapeutic Exercise: Demonstrated, performed, and issued initial HEP  Self Care: Discussed gradual progression into walking activity Ice for pain/swelling Activities to currently avoid (deep squatting, pivoting)     PATIENT EDUCATION:  Education details: see treatment; POC  Person educated: Patient Education method: Explanation, Demonstration, Tactile cues, Verbal cues, and Handouts Education comprehension: verbalized understanding, returned demonstration, verbal cues required, tactile cues required, and needs further education  HOME EXERCISE PROGRAM: Access Code: [email or web address] URL: [email or web address] Date: 06/04/2024 Prepared by: Lucie Meeter  Exercises - Gastroc Stretch on Wall  - 1 x daily - 7 x weekly - 3 sets - 30 sec  hold - Soleus Stretch on Wall   - 1 x daily - 7 x weekly - 3 sets - 30 sec  hold - Supine  Quadriceps Stretch with Strap on Table  - 1 x daily - 7 x weekly - 3 sets - 30 sec  hold - Supine Bridge  - 1 x daily - 7 x weekly - 2 sets - 10 reps - Supine Active Straight Leg Raise  - 1 x daily - 7 x weekly - 2 sets - 10 reps  ASSESSMENT:  CLINICAL IMPRESSION: Patient is a 53 y.o. female who was seen today for physical therapy evaluation and treatment for acute Lt knee pain that began when she was in a deep squat for exercise. She reports the knee gave away on her in this position and had immediate pain. The pain worsened over the next 2-3 weeks, though recent improvement following ortho appointment where she was prescribed prednisone  and instructed to wear knee brace. Upon assessment she is noted to have full and pain free Lt knee AROM. She has negative meniscal special testing. She has palpable tenderness about the patellar tendon, hip and knee weakness, gastroc and quadriceps tightness, and aberrant squatting mechanics. She will benefit from skilled PT to address the above stated deficits in order to optimize her function and assist in overall pain reduction.   OBJECTIVE IMPAIRMENTS: decreased activity tolerance, decreased balance, decreased endurance, decreased knowledge of condition, difficulty walking, decreased strength, increased edema, impaired flexibility, improper body mechanics, and pain.   ACTIVITY LIMITATIONS: carrying, lifting, bending, standing, squatting, stairs, transfers, and locomotion level  PARTICIPATION LIMITATIONS: meal prep, cleaning, laundry, shopping, community activity, and recreational activity  PERSONAL FACTORS: Age, Profession, and Time since onset of injury/illness/exacerbation are also affecting patient's functional outcome.   REHAB POTENTIAL: Good  CLINICAL DECISION MAKING: Stable/uncomplicated  EVALUATION COMPLEXITY: Low   GOALS: Goals reviewed with patient? Yes  SHORT TERM GOALS: Target date:  07/02/2024  Patient will be independent and compliant with initial HEP.   Baseline: initial HEP issued Goal status: INITIAL  2.  Patient will demonstrate at least 10 degrees of bilateral ankle DF AROM to reduce stress on knee with closed chain tasks.  Baseline: see above Goal status: INITIAL  3.  Patient will demonstrate proper squat mechanics without pain.  Baseline: see above Goal status: INITIAL  LONG TERM GOALS: Target date: 08/01/24  Patient will score >/= 60/80 on the LEFS (MCID is 9) to signify clinically meaningful improvement in functional abilities.   Baseline: see above Goal status: INITIAL  2.  Patient will demonstrate 5/5 Lt hip and knee strength to improve stability with prolonged walking/standing activity.  Baseline: see above Goal status: INITIAL  3.  Patient will return to previous exercise regimen with minimal/no knee pain.  Baseline: unable Goal status: INITIAL  4.  Patient will be independent with advanced home program to progress/maintain current level of function.  Baseline: initial HEP issued  Goal status: INITIAL   PLAN:  PT FREQUENCY: 1-2x/week  PT DURATION: 8 weeks  PLANNED INTERVENTIONS: 97164- PT Re-evaluation, 97750- Physical Performance Testing, 97110-Therapeutic exercises, 97530- Therapeutic activity, [id number]- Neuromuscular re-education, 97535- Self Care, 02859- Manual therapy, [id number]- Gait training, [phone]- Aquatic Therapy, [phone]- Electrical stimulation (unattended), [phone]- Electrical stimulation (manual), [id number]- Vasopneumatic device, [id number]- Ionotophoresis 4mg /ml Dexamethasone , 79439 (1-2 muscles), 20561 (3+ muscles)- Dry Needling, Taping, Cryotherapy, and Moist heat  PLAN FOR NEXT SESSION: hip and knee strengthening, work on squat mechanics, calf and quad stretching.   Shaine Mount, PT, DPT, ATC 06/04/24 12:53 PM

## 2024-06-11 ENCOUNTER — Ambulatory Visit

## 2024-06-11 DIAGNOSIS — R262 Difficulty in walking, not elsewhere classified: Secondary | ICD-10-CM

## 2024-06-11 DIAGNOSIS — M6281 Muscle weakness (generalized): Secondary | ICD-10-CM

## 2024-06-11 DIAGNOSIS — R600 Localized edema: Secondary | ICD-10-CM

## 2024-06-11 DIAGNOSIS — M25562 Pain in left knee: Secondary | ICD-10-CM | POA: Diagnosis not present

## 2024-06-11 NOTE — Therapy (Signed)
 OUTPATIENT PHYSICAL THERAPY LOWER EXTREMITY TREATMENT   Patient Name: Danielle Oneill MRN: [id number] DOB:18-Apr-1971, 53 y.o., female Today's Date: 06/11/2024  END OF SESSION:  PT End of Session - 06/11/24 1057     Visit Number 2    Number of Visits 17    Date for PT Re-Evaluation 08/01/24    Authorization Type [employer]    PT Start Time 1059    PT Stop Time 1143    PT Time Calculation (min) 44 min    Activity Tolerance Patient tolerated treatment well    Behavior During Therapy WFL for tasks assessed/performed           Past Medical History:  Diagnosis Date   Anxiety    Arthritis    elbows from accident age 26 per Ortho    Asthma    Depression    Endometriosis    GERD (gastroesophageal reflux disease)    OCC - rare    Past Surgical History:  Procedure Laterality Date   OOPHORECTOMY  2009   left   ORTHOPEDIC SURGERY     shattered both elbows and left wrist, age 45   PARTIAL HYSTERECTOMY     ROBOTIC ASSISTED SALPINGO OOPHERECTOMY Right 02/10/2016   Procedure: ROBOTIC ASSISTED RIGHT SALPINGO OOPHORECTOMY LYSIS OF ADHESIONS TREATMENT OF ENDOMETRIOSIS, MYOMECTOMY;  Surgeon: Marie-Lyne Lavoie, MD;  Location: [facility];  Service: Gynecology;  Laterality: Right;   Patient Active Problem List   Diagnosis Date Noted   Acute pain of left knee 05/28/2024   Overweight (BMI 25.0-29.9) 06/12/2019   Vitamin D  deficiency 06/06/2018   Migraine headache with aura 09/27/2017   Patellar tendonitis 08/21/2012   Pre-syncope 06/26/2012   General medical examination 11/05/2011   DYSMENORRHEA 09/14/2009   Insomnia 09/14/2009   Generalized anxiety disorder 10/24/2007   Genital herpes 06/03/2007   Asthma 06/03/2007    PCP: Mahlon Comer BRAVO, MD   REFERRING PROVIDER: Huey Redell Fallow, PA-C  REFERRING DIAG: [phone] (ICD-10-CM) - Knee pain   THERAPY DIAG:  Acute pain of left knee  Muscle weakness (generalized)  Difficulty in walking, not elsewhere classified  Localized  edema  Rationale for Evaluation and Treatment: Rehabilitation  ONSET DATE: June 2025   SUBJECTIVE:   SUBJECTIVE STATEMENT: Patient has been able to complete 20 minute walks with brace. Notices some discomfort in the knee after walking. Has not experienced severe pain since last visit.   PERTINENT HISTORY: None  PAIN:  Are you having pain? Yes: NPRS scale: none currently; at worst 2  Pain location: Lt anterior knee  Pain description: discomfort Aggravating factors: prolonged standing/walking, squats, stairs Relieving factors: rest   PRECAUTIONS: None  RED FLAGS: None   WEIGHT BEARING RESTRICTIONS: No  FALLS:  Has patient fallen in last 6 months? No  LIVING ENVIRONMENT: Lives with: lives with their spouse Lives in: House/apartment Stairs: Yes: Internal: flight steps; can reach both Has following equipment at home: None  OCCUPATION: works for [employer]   PLOF: Independent  PATIENT GOALS: be able to return to workout routine- fast walking, rucking   NEXT MD VISIT: nothing scheduled   OBJECTIVE:  Note: Objective measures were completed at Evaluation unless otherwise noted.  DIAGNOSTIC FINDINGS:  none on file   PATIENT SURVEYS:  LEFS: 39/80  COGNITION: Overall cognitive status: Within functional limits for tasks assessed     SENSATION: Not tested  EDEMA:  Mild swelling about Lt knee   MUSCLE LENGTH: Hamstrings: WNL   POSTURE: No Significant postural limitations  PALPATION: TTP  Lt patellar tendon   LOWER EXTREMITY ROM:  Active ROM Right eval Left eval  Hip flexion    Hip extension    Hip abduction    Hip adduction    Hip internal rotation    Hip external rotation    Knee flexion  WNL  Knee extension  WNL  Ankle dorsiflexion 8 4  Ankle plantarflexion    Ankle inversion    Ankle eversion     (Blank rows = not tested)  LOWER EXTREMITY MMT:  MMT Right eval Left eval  Hip flexion 5 5  Hip extension 4+  4-  Hip abduction 4+ 4-   Hip adduction    Hip internal rotation    Hip external rotation    Knee flexion  4  Knee extension  5  Ankle dorsiflexion    Ankle plantarflexion    Ankle inversion    Ankle eversion     (Blank rows = not tested)  LOWER EXTREMITY SPECIAL TESTS:  McMurray's (-)  Ely (+ LLE)  Thessaly (-)   FUNCTIONAL TESTS:  Squat: Mild Rt hip shift   GAIT: Distance walked: 15 ft  Assistive device utilized: None Level of assistance: Complete Independence Comments: no obvious gait abnormalities    [facility] Adult PT Treatment:                                                DATE: 06/11/24  Manual Therapy: IASTM Lt quadriceps, IT band  Neuromuscular re-ed: Hip bridge with abduction blue band 2 x 10  Clamshell 2 x 10 blue band  Therapeutic Activity: Mini wall squat 2 x 10   Self Care: Office ergonomic recommendations  Recommended to d/c knee brace and suggestions regarding compression sleeve to assist in swelling  Continue ice prn                                                                                                                           [facility] Adult PT Treatment:                                                DATE: 06/04/24 Therapeutic Exercise: Demonstrated, performed, and issued initial HEP  Self Care: Discussed gradual progression into walking activity Ice for pain/swelling Activities to currently avoid (deep squatting, pivoting)     PATIENT EDUCATION:  Education details: HEP update  Person educated: Patient Education method: Explanation, Demonstration, Tactile cues, Verbal cues, and Handouts Education comprehension: verbalized understanding, returned demonstration, verbal cues required, tactile cues required, and needs further education  HOME EXERCISE PROGRAM: Access Code: [email or web address] URL: [email or web address] Date: 06/11/2024 Prepared by: Lucie Meeter  Exercises - Gastroc Stretch on Wall  - 1 x daily -  7 x weekly - 3 sets - 30 sec  hold - Soleus  Stretch on Wall  - 1 x daily - 7 x weekly - 3 sets - 30 sec  hold - Supine Quadriceps Stretch with Strap on Table  - 1 x daily - 7 x weekly - 3 sets - 30 sec  hold - Supine Active Straight Leg Raise  - 1 x daily - 7 x weekly - 2 sets - 10 reps - Bridge with Hip Abduction and Resistance  - 1 x daily - 7 x weekly - 2 sets - 10 reps - Clamshell with Resistance  - 1 x daily - 7 x weekly - 2 sets - 10 reps  ASSESSMENT:  CLINICAL IMPRESSION: Patient tolerated session well today with progression of hip strengthening and squatting activity. She reported initial pressure about Lt knee with wall squats that improved followed IASTM to quad and IT band. She is noted to have notable restriction about vastus lateralis and IT band, so requires continued manual work at future sessions. Recommended that she could try a knee compression sleeve vs. Stabilizing brace with patient verbalizing understanding.   EVAL: Patient is a 53 y.o. female who was seen today for physical therapy evaluation and treatment for acute Lt knee pain that began when she was in a deep squat for exercise. She reports the knee gave away on her in this position and had immediate pain. The pain worsened over the next 2-3 weeks, though recent improvement following ortho appointment where she was prescribed prednisone  and instructed to wear knee brace. Upon assessment she is noted to have full and pain free Lt knee AROM. She has negative meniscal special testing. She has palpable tenderness about the patellar tendon, hip and knee weakness, gastroc and quadriceps tightness, and aberrant squatting mechanics. She will benefit from skilled PT to address the above stated deficits in order to optimize her function and assist in overall pain reduction.   OBJECTIVE IMPAIRMENTS: decreased activity tolerance, decreased balance, decreased endurance, decreased knowledge of condition, difficulty walking, decreased strength, increased edema, impaired flexibility,  improper body mechanics, and pain.   ACTIVITY LIMITATIONS: carrying, lifting, bending, standing, squatting, stairs, transfers, and locomotion level  PARTICIPATION LIMITATIONS: meal prep, cleaning, laundry, shopping, community activity, and recreational activity  PERSONAL FACTORS: Age, Profession, and Time since onset of injury/illness/exacerbation are also affecting patient's functional outcome.   REHAB POTENTIAL: Good  CLINICAL DECISION MAKING: Stable/uncomplicated  EVALUATION COMPLEXITY: Low   GOALS: Goals reviewed with patient? Yes  SHORT TERM GOALS: Target date: 07/02/2024  Patient will be independent and compliant with initial HEP.   Baseline: initial HEP issued Goal status: INITIAL  2.  Patient will demonstrate at least 10 degrees of bilateral ankle DF AROM to reduce stress on knee with closed chain tasks.  Baseline: see above Goal status: INITIAL  3.  Patient will demonstrate proper squat mechanics without pain.  Baseline: see above Goal status: INITIAL  LONG TERM GOALS: Target date: 08/01/24  Patient will score >/= 60/80 on the LEFS (MCID is 9) to signify clinically meaningful improvement in functional abilities.   Baseline: see above Goal status: INITIAL  2.  Patient will demonstrate 5/5 Lt hip and knee strength to improve stability with prolonged walking/standing activity.  Baseline: see above Goal status: INITIAL  3.  Patient will return to previous exercise regimen with minimal/no knee pain.  Baseline: unable Goal status: INITIAL  4.  Patient will be independent with advanced home program to progress/maintain current level  of function.  Baseline: initial HEP issued  Goal status: INITIAL   PLAN:  PT FREQUENCY: 1-2x/week  PT DURATION: 8 weeks  PLANNED INTERVENTIONS: 97164- PT Re-evaluation, 97750- Physical Performance Testing, 97110-Therapeutic exercises, 97530- Therapeutic activity, [id number]- Neuromuscular re-education, 97535- Self Care, 02859- Manual  therapy, [id number]- Gait training, [phone]- Aquatic Therapy, [phone]- Electrical stimulation (unattended), [phone]- Electrical stimulation (manual), [id number]- Vasopneumatic device, [id number]- Ionotophoresis 4mg /ml Dexamethasone , 79439 (1-2 muscles), 20561 (3+ muscles)- Dry Needling, Taping, Cryotherapy, and Moist heat  PLAN FOR NEXT SESSION: hip and knee strengthening, work on squat mechanics, calf and quad stretching. Consider foam rolling. Potential TPDN (have not discussed)   Lucie Meeter, PT, DPT, ATC 06/11/24 11:45 AM

## 2024-06-12 ENCOUNTER — Telehealth: Payer: Self-pay | Admitting: Family Medicine

## 2024-06-12 NOTE — Telephone Encounter (Signed)
 Copied from CRM [id number]. Topic: Clinical - Medication Question >> Jun 12, 2024  5:09 PM Drema MATSU wrote: Reason for CRM: Patient is requesting motion sickness patches for a cruise.

## 2024-06-13 ENCOUNTER — Other Ambulatory Visit: Payer: Self-pay | Admitting: Family

## 2024-06-13 MED ORDER — SCOPOLAMINE 1 MG/3DAYS TD PT72: 1.0000 | MEDICATED_PATCH | TRANSDERMAL | 0 refills | Status: AC

## 2024-06-18 ENCOUNTER — Encounter

## 2024-06-25 ENCOUNTER — Ambulatory Visit: Attending: Orthopedic Surgery | Admitting: Rehabilitative and Restorative Service Providers"

## 2024-06-25 ENCOUNTER — Encounter: Payer: Self-pay | Admitting: Rehabilitative and Restorative Service Providers"

## 2024-06-25 ENCOUNTER — Other Ambulatory Visit: Payer: Self-pay | Admitting: Family Medicine

## 2024-06-25 DIAGNOSIS — R600 Localized edema: Secondary | ICD-10-CM | POA: Diagnosis present

## 2024-06-25 DIAGNOSIS — M25562 Pain in left knee: Secondary | ICD-10-CM | POA: Insufficient documentation

## 2024-06-25 DIAGNOSIS — M6281 Muscle weakness (generalized): Secondary | ICD-10-CM | POA: Diagnosis present

## 2024-06-25 DIAGNOSIS — R262 Difficulty in walking, not elsewhere classified: Secondary | ICD-10-CM | POA: Diagnosis present

## 2024-06-25 NOTE — Therapy (Signed)
 OUTPATIENT PHYSICAL THERAPY LOWER EXTREMITY TREATMENT   Patient Name: Laurin Paulo MRN: [id number] DOB:[birth year], 53 y.o., female Today's Date: 06/25/2024  END OF SESSION:  PT End of Session - 06/25/24 1401     Visit Number 3    Number of Visits 17    Date for PT Re-Evaluation 08/01/24    Authorization Type [employer]    PT Start Time 1402    PT Stop Time 1445    PT Time Calculation (min) 43 min    Activity Tolerance Patient tolerated treatment well    Behavior During Therapy WFL for tasks assessed/performed         Past Medical History:  Diagnosis Date   Anxiety    Arthritis    elbows from accident age 4 per Ortho    Asthma    Depression    Endometriosis    GERD (gastroesophageal reflux disease)    OCC - rare    Past Surgical History:  Procedure Laterality Date   OOPHORECTOMY  2009   left   ORTHOPEDIC SURGERY     shattered both elbows and left wrist, age 40   PARTIAL HYSTERECTOMY     ROBOTIC ASSISTED SALPINGO OOPHERECTOMY Right 02/10/2016   Procedure: ROBOTIC ASSISTED RIGHT SALPINGO OOPHORECTOMY LYSIS OF ADHESIONS TREATMENT OF ENDOMETRIOSIS, MYOMECTOMY;  Surgeon: Marie-Lyne Lavoie, MD;  Location: [facility];  Service: Gynecology;  Laterality: Right;   Patient Active Problem List   Diagnosis Date Noted   Acute pain of left knee 05/28/2024   Overweight (BMI 25.0-29.9) 06/12/2019   Vitamin D  deficiency 06/06/2018   Migraine headache with aura 09/27/2017   Patellar tendonitis 08/21/2012   Pre-syncope 06/26/2012   General medical examination 11/05/2011   DYSMENORRHEA 09/14/2009   Insomnia 09/14/2009   Generalized anxiety disorder 10/24/2007   Genital herpes 06/03/2007   Asthma 06/03/2007    PCP: Mahlon Comer BRAVO, MD   REFERRING PROVIDER: Huey Redell Fallow, PA-C  REFERRING DIAG: [phone] (ICD-10-CM) - Knee pain   THERAPY DIAG:  Acute pain of left knee  Muscle weakness (generalized)  Difficulty in walking, not elsewhere classified  Localized  edema  Rationale for Evaluation and Treatment: Rehabilitation  ONSET DATE: June 2025   SUBJECTIVE:   SUBJECTIVE STATEMENT: The patient reports that she has been walking without pain x 25 minutes. She is not on level surfaces due to neighborhood roads. She has not returned to weighted vest, which is 8 lbs.  Note she is protecting her knee (going down stairs).   PERTINENT HISTORY: None  PAIN:  Are you having pain? Yes: NPRS scale: none currently  Pain location: Lt anterior knee  Pain description: discomfort Aggravating factors: prolonged standing/walking, squats, stairs Relieving factors: rest   PRECAUTIONS: None  WEIGHT BEARING RESTRICTIONS: No  FALLS:  Has patient fallen in last 6 months? No  LIVING ENVIRONMENT: Lives with: lives with their spouse Lives in: House/apartment Stairs: Yes: Internal: flight steps; can reach both Has following equipment at home: None  OCCUPATION: works for [employer]   PLOF: Independent  PATIENT GOALS: be able to return to workout routine- fast walking, rucking   NEXT MD VISIT: nothing scheduled   OBJECTIVE:  Note: Objective measures were completed at Evaluation unless otherwise noted.  DIAGNOSTIC FINDINGS:  none on file   PATIENT SURVEYS:  LEFS: 39/80  COGNITION: Overall cognitive status: Within functional limits for tasks assessed     SENSATION: Not tested  EDEMA:  Mild swelling about Lt knee   MUSCLE LENGTH: Hamstrings: WNL  POSTURE: No  Significant postural limitations  PALPATION: TTP Lt patellar tendon   LOWER EXTREMITY ROM: Active ROM Right eval Left eval  Hip flexion    Hip extension    Hip abduction    Hip adduction    Hip internal rotation    Hip external rotation    Knee flexion  WNL  Knee extension  WNL  Ankle dorsiflexion 8 4  Ankle plantarflexion    Ankle inversion    Ankle eversion     (Blank rows = not tested)  LOWER EXTREMITY MMT: MMT Right eval Left eval  Hip flexion 5 5  Hip  extension 4+  4-  Hip abduction 4+ 4-  Hip adduction    Hip internal rotation    Hip external rotation    Knee flexion  4  Knee extension  5  Ankle dorsiflexion    Ankle plantarflexion    Ankle inversion    Ankle eversion     (Blank rows = not tested)  LOWER EXTREMITY SPECIAL TESTS:  McMurray's (-)  Ely (+ LLE)  Thessaly (-)   FUNCTIONAL TESTS:  Squat: Mild Rt hip shift   GAIT: Distance walked: 15 ft  Assistive device utilized: None Level of assistance: Complete Independence Comments: no obvious gait abnormalities   [facility] Adult PT Treatment:                                                DATE: 06/25/24 Therapeutic Exercise: Prone Quad stretch with strap Seated Hamstring stretch with cues for technique Manual Therapy: IASTM lateral quad near ITB and quad Home muscle rolling with massage stick initiated Neuromuscular re-ed: Single leg standing balance x 15 seconds R and L Therapeutic Activity: Step ups lateral 4 step x 10 reps no UE support Step ups anterior 4 step x 10 reps no UE support Clock reach for eccentric control of L and R LE 12, 3, 6 (no cross body) x 8 reps R and L sides Single leg dead lift to chair with cues for technique   [facility] Adult PT Treatment:                                                DATE: 06/11/24 Manual Therapy: IASTM Lt quadriceps, IT band  Neuromuscular re-ed: Hip bridge with abduction blue band 2 x 10  Clamshell 2 x 10 blue band  Therapeutic Activity: Mini wall squat 2 x 10  Self Care: Office ergonomic recommendations  Recommended to d/c knee brace and suggestions regarding compression sleeve to assist in swelling  Continue ice prn  [facility] Adult PT Treatment:                                                DATE: 06/04/24 Therapeutic Exercise: Demonstrated, performed, and issued initial HEP  Self Care: Discussed  gradual progression into walking activity Ice for pain/swelling Activities to currently avoid (deep squatting, pivoting)     PATIENT EDUCATION:  Education details: HEP update  Person educated: Patient Education method: Explanation, Demonstration, Tactile cues, Verbal cues, and Handouts Education comprehension: verbalized understanding, returned demonstration, verbal cues required, tactile cues required, and needs further education  HOME EXERCISE PROGRAM: Access Code: [email or web address] URL: [email or web address] Date: 06/11/2024 Prepared by: Lucie Meeter  Exercises - Gastroc Stretch on Wall  - 1 x daily - 7 x weekly - 3 sets - 30 sec  hold - Soleus Stretch on Wall  - 1 x daily - 7 x weekly - 3 sets - 30 sec  hold - Supine Quadriceps Stretch with Strap on Table  - 1 x daily - 7 x weekly - 3 sets - 30 sec  hold - Supine Active Straight Leg Raise  - 1 x daily - 7 x weekly - 2 sets - 10 reps - Bridge with Hip Abduction and Resistance  - 1 x daily - 7 x weekly - 2 sets - 10 reps - Clamshell with Resistance  - 1 x daily - 7 x weekly - 2 sets - 10 reps  ASSESSMENT:  CLINICAL IMPRESSION: The patient is continuing to improve with increased tolerance to walking and exercises. PT modified HEP to add HS stretch, clock reach, modify quad stretch, and add muscle rolling for home. She does have greater difficulty with L single leg dead lift noting discomfort in L glut/proximal HS (therefore adding HS stretch). PT to continue to progress.   EVAL: Patient is a 53 y.o. female who was seen today for physical therapy evaluation and treatment for acute Lt knee pain that began when she was in a deep squat for exercise. She reports the knee gave away on her in this position and had immediate pain. The pain worsened over the next 2-3 weeks, though recent improvement following ortho appointment where she was prescribed prednisone  and instructed to wear knee brace. Upon assessment she is noted to have full  and pain free Lt knee AROM. She has negative meniscal special testing. She has palpable tenderness about the patellar tendon, hip and knee weakness, gastroc and quadriceps tightness, and aberrant squatting mechanics. She will benefit from skilled PT to address the above stated deficits in order to optimize her function and assist in overall pain reduction.   OBJECTIVE IMPAIRMENTS: decreased activity tolerance, decreased balance, decreased endurance, decreased knowledge of condition, difficulty walking, decreased strength, increased edema, impaired flexibility, improper body mechanics, and pain.   ACTIVITY LIMITATIONS: carrying, lifting, bending, standing, squatting, stairs, transfers, and locomotion level  PARTICIPATION LIMITATIONS: meal prep, cleaning, laundry, shopping, community activity, and recreational activity  PERSONAL FACTORS: Age, Profession, and Time since onset of injury/illness/exacerbation are also affecting patient's functional outcome.   REHAB POTENTIAL: Good  CLINICAL DECISION MAKING: Stable/uncomplicated  EVALUATION COMPLEXITY: Low   GOALS: Goals reviewed with patient? Yes  SHORT TERM GOALS: Target date: 07/02/2024  Patient will be independent and compliant with initial HEP.   Baseline: initial HEP issued Goal status: INITIAL  2.  Patient will demonstrate at least  10 degrees of bilateral ankle DF AROM to reduce stress on knee with closed chain tasks.  Baseline: see above Goal status: INITIAL  3.  Patient will demonstrate proper squat mechanics without pain.  Baseline: see above Goal status: INITIAL  LONG TERM GOALS: Target date: 08/01/24  Patient will score >/= 60/80 on the LEFS (MCID is 9) to signify clinically meaningful improvement in functional abilities.   Baseline: see above Goal status: INITIAL  2.  Patient will demonstrate 5/5 Lt hip and knee strength to improve stability with prolonged walking/standing activity.  Baseline: see above Goal status:  INITIAL  3.  Patient will return to previous exercise regimen with minimal/no knee pain.  Baseline: unable Goal status: INITIAL  4.  Patient will be independent with advanced home program to progress/maintain current level of function.  Baseline: initial HEP issued  Goal status: INITIAL   PLAN:  PT FREQUENCY: 1-2x/week  PT DURATION: 8 weeks  PLANNED INTERVENTIONS: 97164- PT Re-evaluation, 97750- Physical Performance Testing, 97110-Therapeutic exercises, 97530- Therapeutic activity, [id number]- Neuromuscular re-education, 97535- Self Care, 02859- Manual therapy, [id number]- Gait training, [id number]- Aquatic Therapy, [phone]- Electrical stimulation (unattended), [phone]- Electrical stimulation (manual), [id number]- Vasopneumatic device, [id number]- Ionotophoresis 4mg /ml Dexamethasone , 79439 (1-2 muscles), 20561 (3+ muscles)- Dry Needling, Taping, Cryotherapy, and Moist heat  PLAN FOR NEXT SESSION: hip and knee strengthening, work on squat mechanics, calf and quad stretching. Consider foam rolling.  Consider adding modified squats to HEP to begin working on squat progression, guidance for weighted vest walking, and education for best exercise parameters (discussed today 150 min cardio/week + 2 days of strength training). Patient uses exercise videos for strength training-- may need to further discuss to provide recommendations.    Samantha Pexa, PT, DPT, ATC 06/25/24 2:01 PM

## 2024-06-25 NOTE — Telephone Encounter (Signed)
 Requested Prescriptions   Pending Prescriptions Disp Refills   ALPRAZolam  (XANAX ) 0.5 MG tablet [Pharmacy Med Name: ALPRAZOLAM  0.5 MG TABLET] 120 tablet 0    Sig: TAKE 2 TABS BY MOUTH AT BEDTIME AS NEEDED FOR INSOMNIA & 1 TAB TWICE DAILY AS NEEDED FOR ANXIETY     Date of patient request: 06/25/2024 Last office visit: 10/31/2023 Upcoming visit: Visit date not found Date of last refill: 05/04/2024 Last refill amount: 30

## 2024-06-25 NOTE — Telephone Encounter (Signed)
 Called patient. Medication was refilled today. I advised patient that pharmacy should contact her when ready for pick up.     Copied from CRM [id number]. Topic: Clinical - Medication Question >> Jun 25, 2024  1:14 PM Chiquita SQUIBB wrote: Reason for CRM: Patient is calling in stating that she is out of the ALPRAZolam  (XANAX ) 0.5 MG tablet [[id number]]. Refill has been submitted by the pharmacy today but the patient is asking if this can be sent as soon as possible.

## 2024-07-02 ENCOUNTER — Ambulatory Visit

## 2024-07-02 DIAGNOSIS — R600 Localized edema: Secondary | ICD-10-CM

## 2024-07-02 DIAGNOSIS — M25562 Pain in left knee: Secondary | ICD-10-CM

## 2024-07-02 DIAGNOSIS — M6281 Muscle weakness (generalized): Secondary | ICD-10-CM

## 2024-07-02 DIAGNOSIS — R262 Difficulty in walking, not elsewhere classified: Secondary | ICD-10-CM

## 2024-07-02 NOTE — Therapy (Signed)
 OUTPATIENT PHYSICAL THERAPY LOWER EXTREMITY TREATMENT   Patient Name: Danielle Oneill MRN: [id number] DOB:1971/07/27, 53 y.o., female Today's Date: 07/02/2024  END OF SESSION:  PT End of Session - 07/02/24 1059     Visit Number 4    Number of Visits 17    Date for PT Re-Evaluation 08/01/24    Authorization Type [employer]    PT Start Time 1100    PT Stop Time 1143    PT Time Calculation (min) 43 min    Activity Tolerance Patient tolerated treatment well    Behavior During Therapy WFL for tasks assessed/performed          Past Medical History:  Diagnosis Date   Anxiety    Arthritis    elbows from accident age 24 per Ortho    Asthma    Depression    Endometriosis    GERD (gastroesophageal reflux disease)    OCC - rare    Past Surgical History:  Procedure Laterality Date   OOPHORECTOMY  2009   left   ORTHOPEDIC SURGERY     shattered both elbows and left wrist, age 21   PARTIAL HYSTERECTOMY     ROBOTIC ASSISTED SALPINGO OOPHERECTOMY Right 02/10/2016   Procedure: ROBOTIC ASSISTED RIGHT SALPINGO OOPHORECTOMY LYSIS OF ADHESIONS TREATMENT OF ENDOMETRIOSIS, MYOMECTOMY;  Surgeon: Marie-Lyne Lavoie, MD;  Location: [facility];  Service: Gynecology;  Laterality: Right;   Patient Active Problem List   Diagnosis Date Noted   Acute pain of left knee 05/28/2024   Overweight (BMI 25.0-29.9) 06/12/2019   Vitamin D  deficiency 06/06/2018   Migraine headache with aura 09/27/2017   Patellar tendonitis 08/21/2012   Pre-syncope 06/26/2012   General medical examination 11/05/2011   DYSMENORRHEA 09/14/2009   Insomnia 09/14/2009   Generalized anxiety disorder 10/24/2007   Genital herpes 06/03/2007   Asthma 06/03/2007    PCP: Mahlon Comer BRAVO, MD   REFERRING PROVIDER: Huey Redell Fallow, PA-C  REFERRING DIAG: [phone] (ICD-10-CM) - Knee pain   THERAPY DIAG:  Acute pain of left knee  Muscle weakness (generalized)  Difficulty in walking, not elsewhere classified  Localized  edema  Rationale for Evaluation and Treatment: Rehabilitation  ONSET DATE: June 2025   SUBJECTIVE:   SUBJECTIVE STATEMENT: The patient reports she has been doing fast walking for 30 minutes without issues. No pain right now and has not experienced pain over the past week. Was just sore in the knee after last session.   PERTINENT HISTORY: None  PAIN:  Are you having pain? No   PRECAUTIONS: None  WEIGHT BEARING RESTRICTIONS: No  FALLS:  Has patient fallen in last 6 months? No  LIVING ENVIRONMENT: Lives with: lives with their spouse Lives in: House/apartment Stairs: Yes: Internal: flight steps; can reach both Has following equipment at home: None  OCCUPATION: works for [employer]   PLOF: Independent  PATIENT GOALS: be able to return to workout routine- fast walking, rucking   NEXT MD VISIT: nothing scheduled   OBJECTIVE:  Note: Objective measures were completed at Evaluation unless otherwise noted.  DIAGNOSTIC FINDINGS:  none on file   PATIENT SURVEYS:  LEFS: 39/80  COGNITION: Overall cognitive status: Within functional limits for tasks assessed     SENSATION: Not tested  EDEMA:  Mild swelling about Lt knee   MUSCLE LENGTH: Hamstrings: WNL  POSTURE: No Significant postural limitations  PALPATION: TTP Lt patellar tendon   LOWER EXTREMITY ROM: Active ROM Right eval Left eval 07/02/24  Hip flexion     Hip extension  Hip abduction     Hip adduction     Hip internal rotation     Hip external rotation     Knee flexion  WNL   Knee extension  WNL   Ankle dorsiflexion 8 4 10  bilateral  Ankle plantarflexion     Ankle inversion     Ankle eversion      (Blank rows = not tested)  LOWER EXTREMITY MMT: MMT Right eval Left eval  Hip flexion 5 5  Hip extension 4+  4-  Hip abduction 4+ 4-  Hip adduction    Hip internal rotation    Hip external rotation    Knee flexion  4  Knee extension  5  Ankle dorsiflexion    Ankle plantarflexion     Ankle inversion    Ankle eversion     (Blank rows = not tested)  LOWER EXTREMITY SPECIAL TESTS:  McMurray's (-)  Ely (+ LLE)  Thessaly (-)   FUNCTIONAL TESTS:  Squat: Mild Rt hip shift   GAIT: Distance walked: 15 ft  Assistive device utilized: None Level of assistance: Complete Independence Comments: no obvious gait abnormalities  [facility] Adult PT Treatment:                                                DATE: 07/02/24 Therapeutic Exercise: Discussed and performed various stretching activity with strap and standing- updated on HEP   Neuromuscular re-ed: Opposite touchdown to kettlebell 2 x 10  Therapeutic Activity: Squats x 10  Reverse lunge 2 x 10 each  Lateral lunge x 10 each  Self Care: Discussed recommendations on strength training 3 days a week and activities to avoid including deep squatting, twisting, and jumping activity  Can begin weighted walking to tolerance   [facility] Adult PT Treatment:                                                DATE: 06/25/24 Therapeutic Exercise: Prone Quad stretch with strap Seated Hamstring stretch with cues for technique Manual Therapy: IASTM lateral quad near ITB and quad Home muscle rolling with massage stick initiated Neuromuscular re-ed: Single leg standing balance x 15 seconds R and L Therapeutic Activity: Step ups lateral 4 step x 10 reps no UE support Step ups anterior 4 step x 10 reps no UE support Clock reach for eccentric control of L and R LE 12, 3, 6 (no cross body) x 8 reps R and L sides Single leg dead lift to chair with cues for technique   [facility] Adult PT Treatment:                                                DATE: 06/11/24 Manual Therapy: IASTM Lt quadriceps, IT band  Neuromuscular re-ed: Hip bridge with abduction blue band 2 x 10  Clamshell 2 x 10 blue band  Therapeutic Activity: Mini wall squat 2 x 10  Self Care: Office ergonomic recommendations  Recommended to d/c knee brace and suggestions regarding  compression sleeve to assist in swelling  Continue ice prn  PATIENT EDUCATION:  Education details: HEP update  Person educated: Patient Education method: Explanation, Demonstration, Tactile cues, Verbal cues, and Handouts Education comprehension: verbalized understanding, returned demonstration, verbal cues required, tactile cues required, and needs further education  HOME EXERCISE PROGRAM: Access Code: [email or web address] URL: [email or web address] Date: 07/02/2024 Prepared by: Lucie Meeter  Program Notes Add 2-3 minutes of walking x 3 days, ensure you do not have increased pain, if not, you can slowly increase (another 2-3 minutes).  Exercises - Single Leg Balance with Clock Reach  - 1 x daily - 5 x weekly - 1 sets - 10 reps - Supine Active Straight Leg Raise  - 1 x daily - 5 x weekly - 2 sets - 10 reps - Bridge with Hip Abduction and Resistance  - 1 x daily - 5 x weekly - 2 sets - 10 reps - Clamshell with Resistance  - 1 x daily - 5 x weekly - 2 sets - 10 reps - Gastroc Stretch on Wall  - 1 x daily - 7 x weekly - 3 sets - 30 sec  hold - Soleus Stretch on Wall  - 1 x daily - 7 x weekly - 3 sets - 30 sec  hold - Prone Quadriceps Stretch with Strap  - 1 x daily - 7 x weekly - 1 sets - 3 reps - 20-30 seconda hold - Standing Quadriceps Stretch  - 1 x daily - 7 x weekly - 3 sets - 30 sec  hold - Standing Hamstring Stretch with Step  - 1 x daily - 7 x weekly - 3 sets - 30 sec  hold - Supine ITB Stretch with Strap  - 1 x daily - 7 x weekly - 3 sets - 30 sec  hold - Hip Adductors and Hamstring Stretch with Strap  - 1 x daily - 7 x weekly - 3 sets - 30 sec  hold - Supine Hamstring Stretch with Strap  - 1 x daily - 7 x weekly - 3 sets - 30 sec  hold - Forward Reach  - 1 x daily - 7 x weekly - 2 sets - 10 reps  ASSESSMENT:  CLINICAL IMPRESSION: Patient tolerated session well today focusing on CKC strength progression and dynamic balance activity. She  demonstrates good form with squatting and lunging activity without onset of pain. Challenged with dynamic SL balance activity requiring occasional use of stepping strategy to regain balance(LLE> RLE). We also discussed various stretching options that she can incorporate into her work day in standing and with stretch strap, which were added to HEP. Encouraged to begin light progression of strength training as part of her workout routine with education on activities to avoid with patient verbalizing understanding.   EVAL: Patient is a 52 y.o. female who was seen today for physical therapy evaluation and treatment for acute Lt knee pain that began when she was in a deep squat for exercise. She reports the knee gave away on her in this position and had immediate pain. The pain worsened over the next 2-3 weeks, though recent improvement following ortho appointment where she was prescribed prednisone  and instructed to wear knee brace. Upon assessment she is noted to have full and pain free Lt knee AROM. She has negative meniscal special testing. She has palpable tenderness about the patellar tendon, hip and knee weakness, gastroc and quadriceps tightness, and aberrant squatting mechanics. She will benefit from skilled PT to address the above stated deficits in order to optimize her function and assist  in overall pain reduction.   OBJECTIVE IMPAIRMENTS: decreased activity tolerance, decreased balance, decreased endurance, decreased knowledge of condition, difficulty walking, decreased strength, increased edema, impaired flexibility, improper body mechanics, and pain.   ACTIVITY LIMITATIONS: carrying, lifting, bending, standing, squatting, stairs, transfers, and locomotion level  PARTICIPATION LIMITATIONS: meal prep, cleaning, laundry, shopping, community activity, and recreational activity  PERSONAL FACTORS: Age, Profession, and Time since onset of injury/illness/exacerbation are also affecting patient's  functional outcome.   REHAB POTENTIAL: Good  CLINICAL DECISION MAKING: Stable/uncomplicated  EVALUATION COMPLEXITY: Low   GOALS: Goals reviewed with patient? Yes  SHORT TERM GOALS: Target date: 07/02/2024  Patient will be independent and compliant with initial HEP.   Baseline: initial HEP issued Goal status: MET  2.  Patient will demonstrate at least 10 degrees of bilateral ankle DF AROM to reduce stress on knee with closed chain tasks.  Baseline: see above Goal status: MET  3.  Patient will demonstrate proper squat mechanics without pain.  Baseline: see above 07/02/24: normalized squat mechanics, pain free  Goal status: MET  LONG TERM GOALS: Target date: 08/01/24  Patient will score >/= 60/80 on the LEFS (MCID is 9) to signify clinically meaningful improvement in functional abilities.   Baseline: see above Goal status: INITIAL  2.  Patient will demonstrate 5/5 Lt hip and knee strength to improve stability with prolonged walking/standing activity.  Baseline: see above Goal status: INITIAL  3.  Patient will return to previous exercise regimen with minimal/no knee pain.  Baseline: unable Goal status: INITIAL  4.  Patient will be independent with advanced home program to progress/maintain current level of function.  Baseline: initial HEP issued  Goal status: INITIAL   PLAN:  PT FREQUENCY: 1-2x/week  PT DURATION: 8 weeks  PLANNED INTERVENTIONS: 97164- PT Re-evaluation, 97750- Physical Performance Testing, 97110-Therapeutic exercises, 97530- Therapeutic activity, [id number]- Neuromuscular re-education, 97535- Self Care, [id number]- Manual therapy, [id number]- Gait training, [phone]- Aquatic Therapy, [phone]- Electrical stimulation (unattended), [id number]- Electrical stimulation (manual), [id number]- Vasopneumatic device, [id number]- Ionotophoresis 4mg /ml Dexamethasone, 20560 (1-2 muscles), 20561 (3+ muscles)- Dry Needling, Taping, Cryotherapy, and Moist heat  PLAN FOR NEXT SESSION: hip and knee  strengthening, work on squat mechanics, calf and quad stretching. Dynamic balance. Tolerance to gentle strength training and weighted walking? Possible D/C    Airyana Sprunger, PT, DPT, ATC 07/02/24 11:46 AM

## 2024-07-08 ENCOUNTER — Ambulatory Visit

## 2024-07-08 DIAGNOSIS — M25562 Pain in left knee: Secondary | ICD-10-CM | POA: Diagnosis not present

## 2024-07-08 DIAGNOSIS — R262 Difficulty in walking, not elsewhere classified: Secondary | ICD-10-CM

## 2024-07-08 DIAGNOSIS — R600 Localized edema: Secondary | ICD-10-CM

## 2024-07-08 DIAGNOSIS — M6281 Muscle weakness (generalized): Secondary | ICD-10-CM

## 2024-07-08 NOTE — Therapy (Addendum)
 OUTPATIENT PHYSICAL THERAPY LOWER EXTREMITY TREATMENT PHYSICAL THERAPY DISCHARGE SUMMARY  Visits from Start of Care: 5  Current functional level related to goals / functional outcomes: See goals below   Remaining deficits: Status unknown   Education / Equipment: N/A   Patient agrees to discharge. Patient goals were partially met. Patient is being discharged due to not returning since the last visit.   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    Asthma    Depression    Endometriosis    GERD (gastroesophageal reflux disease)    OCC - rare    Past Surgical History:  Procedure Laterality Date   OOPHORECTOMY  2009   left   ORTHOPEDIC SURGERY     shattered both elbows and left wrist, age 53   PARTIAL HYSTERECTOMY     ROBOTIC ASSISTED SALPINGO OOPHERECTOMY Right 02/10/2016   Procedure: ROBOTIC ASSISTED RIGHT SALPINGO OOPHORECTOMY LYSIS OF ADHESIONS TREATMENT OF ENDOMETRIOSIS, MYOMECTOMY;  Surgeon: Marie-Lyne Lavoie, MD;  Location: [facility];  Service: Gynecology;  Laterality: Right;   Patient Active Problem List   Diagnosis Date Noted   Acute pain of left knee 05/28/2024   Overweight (BMI 25.0-29.9) 06/12/2019   Vitamin D  deficiency 06/06/2018   Migraine headache with aura 09/27/2017   Patellar tendonitis 08/21/2012   Pre-syncope 06/26/2012   General medical examination 11/05/2011   DYSMENORRHEA 09/14/2009   Insomnia 09/14/2009   Generalized  anxiety disorder 10/24/2007   Genital herpes 06/03/2007   Asthma 06/03/2007    PCP: Mahlon Comer BRAVO, MD   REFERRING PROVIDER: Huey Redell Fallow, PA-C  REFERRING DIAG: [phone] (ICD-10-CM) - Knee pain   THERAPY DIAG:  Acute pain of left knee  Muscle weakness (generalized)  Difficulty in walking, not elsewhere classified  Localized edema  Rationale for Evaluation and Treatment: Rehabilitation  ONSET DATE: June 2025   SUBJECTIVE:   SUBJECTIVE STATEMENT: Patient reports she had a setback. She tried standing more at her desk for work, but was barefoot. She was having pain in the ball of her foot when doing this mostly on the Rt. She tried walking with the weighted vest twice and had anterior knee pain (distal quad) both times. Felt the pain towards the end of the walk. No pain currently.   PERTINENT HISTORY: None  PAIN:  Are you having pain? No   PRECAUTIONS: None  WEIGHT BEARING RESTRICTIONS: No  FALLS:  Has patient fallen in last 6 months? No  LIVING ENVIRONMENT: Lives with: lives with their spouse Lives in: House/apartment Stairs: Yes: Internal: flight steps; can reach both Has following equipment at home: None  OCCUPATION: works for [employer]   PLOF: Independent  PATIENT GOALS: be able to return to workout routine- fast walking, rucking   NEXT MD VISIT: nothing scheduled   OBJECTIVE:  Note:  Objective measures were completed at Evaluation unless otherwise noted.  DIAGNOSTIC FINDINGS:  none on file   PATIENT SURVEYS:  LEFS: 39/80  COGNITION: Overall cognitive status: Within functional limits for tasks assessed     SENSATION: Not tested  EDEMA:  Mild swelling about Lt knee   MUSCLE LENGTH: Hamstrings: WNL  POSTURE: No Significant postural limitations  PALPATION: TTP Lt patellar tendon   LOWER EXTREMITY ROM: Active ROM Right eval Left eval 07/02/24  Hip flexion     Hip extension     Hip abduction     Hip adduction      Hip internal rotation     Hip external rotation     Knee flexion  WNL   Knee extension  WNL   Ankle dorsiflexion 8 4 10  bilateral  Ankle plantarflexion     Ankle inversion     Ankle eversion      (Blank rows = not tested)  LOWER EXTREMITY MMT: MMT Right eval Left eval  Hip flexion 5 5  Hip extension 4+  4-  Hip abduction 4+ 4-  Hip adduction    Hip internal rotation    Hip external rotation    Knee flexion  4  Knee extension  5  Ankle dorsiflexion    Ankle plantarflexion    Ankle inversion    Ankle eversion     (Blank rows = not tested)  LOWER EXTREMITY SPECIAL TESTS:  McMurray's (-)  Ely (+ LLE)  Thessaly (-)   FUNCTIONAL TESTS:  Squat: Mild Rt hip shift   GAIT: Distance walked: 15 ft  Assistive device utilized: None Level of assistance: Complete Independence Comments: no obvious gait abnormalities  [facility] Adult PT Treatment:                                                DATE: 07/08/24 Therapeutic Exercise: Lateral band walks green band at shins 3 sets d/b x 10 ft  Reviewed and updated HEP  Manual Therapy: Skilled palpation of trigger points STM Lt quadriceps, IT band  Neuromuscular re-ed: 3 way hip on airex x 10 each  Trigger Point Dry Needling  Initial Treatment: Pt instructed on Dry Needling rational, procedures, and possible side effects. Pt instructed to expect mild to moderate muscle soreness later in the day and/or into the next day.  Pt instructed in methods to reduce muscle soreness. Pt instructed to continue prescribed HEP. Patient was educated on signs and symptoms of infection and other risk factors and advised to seek medical attention should they occur.  Patient verbalized understanding of these instructions and education.   Patient Verbal Consent Given: Yes Education Handout Provided: Yes Muscles Treated: Lt quadriceps  Electrical Stimulation Performed: No Treatment Response/Outcome: twitch response elicited   Self Care: Recommended to  stretch prior to weighted walking Shoe recommendations for indoor use   [facility] Adult PT Treatment:                                                DATE: 07/02/24 Therapeutic Exercise: Discussed and performed various stretching activity with strap and standing- updated on HEP   Neuromuscular re-ed: Opposite touchdown to kettlebell 2 x 10  Therapeutic Activity: Squats x 10  Reverse  lunge 2 x 10 each  Lateral lunge x 10 each  Self Care: Discussed recommendations on strength training 3 days a week and activities to avoid including deep squatting, twisting, and jumping activity  Can begin weighted walking to tolerance   [facility] Adult PT Treatment:                                                DATE: 06/25/24 Therapeutic Exercise: Prone Quad stretch with strap Seated Hamstring stretch with cues for technique Manual Therapy: IASTM lateral quad near ITB and quad Home muscle rolling with massage stick initiated Neuromuscular re-ed: Single leg standing balance x 15 seconds R and L Therapeutic Activity: Step ups lateral 4 step x 10 reps no UE support Step ups anterior 4 step x 10 reps no UE support Clock reach for eccentric control of L and R LE 12, 3, 6 (no cross body) x 8 reps R and L sides Single leg dead lift to chair with cues for technique   [facility] Adult PT Treatment:                                                DATE: 06/11/24 Manual Therapy: IASTM Lt quadriceps, IT band  Neuromuscular re-ed: Hip bridge with abduction blue band 2 x 10  Clamshell 2 x 10 blue band  Therapeutic Activity: Mini wall squat 2 x 10  Self Care: Office ergonomic recommendations  Recommended to d/c knee brace and suggestions regarding compression sleeve to assist in swelling  Continue ice prn                         PATIENT EDUCATION:  Education details: HEP update; see treatment  Person educated: Patient Education method: Explanation, Demonstration, Tactile cues, Verbal cues, and Handouts Education  comprehension: verbalized understanding, returned demonstration, verbal cues required, tactile cues required, and needs further education  HOME EXERCISE PROGRAM: Access Code: [email or web address] URL: [email or web address] Date: 07/08/2024 Prepared by: Lucie Meeter  Program Notes Add 2-3 minutes of walking x 3 days, ensure you do not have increased pain, if not, you can slowly increase (another 2-3 minutes).  Exercises - Single Leg Balance with Clock Reach  - 1 x daily - 5 x weekly - 1 sets - 10 reps - Supine Active Straight Leg Raise  - 1 x daily - 5 x weekly - 2 sets - 10 reps - Bridge with Hip Abduction and Resistance  - 1 x daily - 5 x weekly - 2 sets - 10 reps - Clamshell with Resistance  - 1 x daily - 5 x weekly - 2 sets - 10 reps - Gastroc Stretch on Wall  - 1 x daily - 7 x weekly - 3 sets - 30 sec  hold - Soleus Stretch on Wall  - 1 x daily - 7 x weekly - 3 sets - 30 sec  hold - Prone Quadriceps Stretch with Strap  - 1 x daily - 7 x weekly - 1 sets - 3 reps - 20-30 seconda hold - Standing Quadriceps Stretch  - 1 x daily - 7 x weekly - 3 sets - 30 sec  hold - Standing Hamstring Stretch with Step  -  1 x daily - 7 x weekly - 3 sets - 30 sec  hold - Supine ITB Stretch with Strap  - 1 x daily - 7 x weekly - 3 sets - 30 sec  hold - Hip Adductors and Hamstring Stretch with Strap  - 1 x daily - 7 x weekly - 3 sets - 30 sec  hold - Supine Hamstring Stretch with Strap  - 1 x daily - 7 x weekly - 3 sets - 30 sec  hold - Forward Reach  - 1 x daily - 7 x weekly - 2 sets - 10 reps - Standing 3-Way Kick  - 1 x daily - 5 x weekly - 2 sets - 10 reps - Side Stepping with Resistance at Ankles  - 1 x daily - 5 x weekly - 2 sets - 10 reps  ASSESSMENT:  CLINICAL IMPRESSION: Patient attempted weighted walking since last session and had pain about Lt distal quad towards end of walking. She is noted to have tautness and palpable tenderness about quadriceps. TPDN was performed to quadriceps with  twitch response elicited. Focused on progression of dynamic balance activity on unstable surface and glute strengthening. Challenged with maintain SLS on the LLE during dynamic balance activity. No reports of knee pain throughout session.   EVAL: Patient is a 53 y.o. female who was seen today for physical therapy evaluation and treatment for acute Lt knee pain that began when she was in a deep squat for exercise. She reports the knee gave away on her in this position and had immediate pain. The pain worsened over the next 2-3 weeks, though recent improvement following ortho appointment where she was prescribed prednisone  and instructed to wear knee brace. Upon assessment she is noted to have full and pain free Lt knee AROM. She has negative meniscal special testing. She has palpable tenderness about the patellar tendon, hip and knee weakness, gastroc and quadriceps tightness, and aberrant squatting mechanics. She will benefit from skilled PT to address the above stated deficits in order to optimize her function and assist in overall pain reduction.   OBJECTIVE IMPAIRMENTS: decreased activity tolerance, decreased balance, decreased endurance, decreased knowledge of condition, difficulty walking, decreased strength, increased edema, impaired flexibility, improper body mechanics, and pain.   ACTIVITY LIMITATIONS: carrying, lifting, bending, standing, squatting, stairs, transfers, and locomotion level  PARTICIPATION LIMITATIONS: meal prep, cleaning, laundry, shopping, community activity, and recreational activity  PERSONAL FACTORS: Age, Profession, and Time since onset of injury/illness/exacerbation are also affecting patient's functional outcome.   REHAB POTENTIAL: Good  CLINICAL DECISION MAKING: Stable/uncomplicated  EVALUATION COMPLEXITY: Low   GOALS: Goals reviewed with patient? Yes  SHORT TERM GOALS: Target date: 07/02/2024  Patient will be independent and compliant with initial HEP.    Baseline: initial HEP issued Goal status: MET  2.  Patient will demonstrate at least 10 degrees of bilateral ankle DF AROM to reduce stress on knee with closed chain tasks.  Baseline: see above Goal status: MET  3.  Patient will demonstrate proper squat mechanics without pain.  Baseline: see above 07/02/24: normalized squat mechanics, pain free  Goal status: MET  LONG TERM GOALS: Target date: 08/01/24  Patient will score >/= 60/80 on the LEFS (MCID is 9) to signify clinically meaningful improvement in functional abilities.   Baseline: see above Goal status: INITIAL  2.  Patient will demonstrate 5/5 Lt hip and knee strength to improve stability with prolonged walking/standing activity.  Baseline: see above Goal status: INITIAL  3.  Patient will return to previous exercise regimen with minimal/no knee pain.  Baseline: unable Goal status: INITIAL  4.  Patient will be independent with advanced home program to progress/maintain current level of function.  Baseline: initial HEP issued  Goal status: INITIAL   PLAN:  PT FREQUENCY: 1-2x/week  PT DURATION: 8 weeks  PLANNED INTERVENTIONS: 97164- PT Re-evaluation, 97750- Physical Performance Testing, 97110-Therapeutic exercises, 97530- Therapeutic activity, [id number]- Neuromuscular re-education, 97535- Self Care, 02859- Manual therapy, [id number]- Gait training, [phone]- Aquatic Therapy, [phone]- Electrical stimulation (unattended), [id number]- Electrical stimulation (manual), [id number]- Vasopneumatic device, [id number]- Ionotophoresis 4mg /ml Dexamethasone , 79439 (1-2 muscles), 20561 (3+ muscles)- Dry Needling, Taping, Cryotherapy, and Moist heat     Lucie Meeter, PT, DPT, ATC 07/08/24 3:30 PM  Lucie Meeter, PT, DPT, ATC 10/26/24 9:14 AM

## 2024-07-08 NOTE — Patient Instructions (Signed)

## 2024-07-15 ENCOUNTER — Ambulatory Visit: Admitting: Rehabilitative and Restorative Service Providers"

## 2024-07-23 ENCOUNTER — Ambulatory Visit: Admitting: Rehabilitative and Restorative Service Providers"

## 2024-08-27 ENCOUNTER — Telehealth: Payer: Self-pay

## 2024-08-27 NOTE — Telephone Encounter (Signed)
 Pt states she has been watching and listen to PODS about health labs. She is asking about 100 +biomarker, liver, cancer labs to see she may or may not get it.   I did ask pt if she had any concerns to have them labs done. She states she is feeling tired at times. She would just like to prevent things before they happen. I did explain that typically without any sx or reasoning behind labs this may be a extra out of pocket cost. I did explain there needs to be a sx or dx for running labs.  Sent to Dr.Tabori - Please advise

## 2024-08-27 NOTE — Telephone Encounter (Signed)
 Copied from CRM [id number]. Topic: Clinical - Request for Lab/Test Order >> Aug 27, 2024  9:13 AM Danielle Oneill wrote: Reason for CRM: Patient requesting a call from provider nurse regarding lab test request.

## 2024-08-28 NOTE — Telephone Encounter (Signed)
 This is not currently a lab that we offer or have access to in our system.

## 2024-08-31 NOTE — Telephone Encounter (Signed)
 Called patient and she said she will order the test online or see if [facility] does it.

## 2024-10-10 ENCOUNTER — Other Ambulatory Visit: Payer: Self-pay | Admitting: Family Medicine

## 2024-10-29 LAB — HM MAMMOGRAPHY

## 2024-10-30 ENCOUNTER — Encounter: Payer: Self-pay | Admitting: Family Medicine

## 2024-11-18 ENCOUNTER — Ambulatory Visit (INDEPENDENT_AMBULATORY_CARE_PROVIDER_SITE_OTHER): Admitting: Family Medicine

## 2024-11-18 ENCOUNTER — Ambulatory Visit: Payer: Self-pay

## 2024-11-18 ENCOUNTER — Encounter: Payer: Self-pay | Admitting: Family Medicine

## 2024-11-18 VITALS — BP 116/82 | HR 79 | Temp 98.5°F | Ht 65.0 in

## 2024-11-18 DIAGNOSIS — Z0000 Encounter for general adult medical examination without abnormal findings: Secondary | ICD-10-CM

## 2024-11-18 DIAGNOSIS — E663 Overweight: Secondary | ICD-10-CM

## 2024-11-18 DIAGNOSIS — Z23 Encounter for immunization: Secondary | ICD-10-CM

## 2024-11-18 DIAGNOSIS — E559 Vitamin D deficiency, unspecified: Secondary | ICD-10-CM | POA: Diagnosis not present

## 2024-11-18 LAB — CBC WITH DIFFERENTIAL/PLATELET
Basophils Absolute: 0 K/uL (ref 0.0–0.1)
Basophils Relative: 0.5 % (ref 0.0–3.0)
Eosinophils Absolute: 0.2 K/uL (ref 0.0–0.7)
Eosinophils Relative: 3.6 % (ref 0.0–5.0)
HCT: 40.3 % (ref 36.0–46.0)
Hemoglobin: 13.7 g/dL (ref 12.0–15.0)
Lymphocytes Relative: 27.2 % (ref 12.0–46.0)
Lymphs Abs: 1.6 K/uL (ref 0.7–4.0)
MCHC: 34 g/dL (ref 30.0–36.0)
MCV: 97.7 fl (ref 78.0–100.0)
Monocytes Absolute: 0.6 K/uL (ref 0.1–1.0)
Monocytes Relative: 9.8 % (ref 3.0–12.0)
Neutro Abs: 3.5 K/uL (ref 1.4–7.7)
Neutrophils Relative %: 58.9 % (ref 43.0–77.0)
Platelets: 288 K/uL (ref 150.0–400.0)
RBC: 4.13 Mil/uL (ref 3.87–5.11)
RDW: 14.2 % (ref 11.5–15.5)
WBC: 6 K/uL (ref 4.0–10.5)

## 2024-11-18 LAB — HEPATIC FUNCTION PANEL
ALT: 19 U/L (ref 3–35)
AST: 20 U/L (ref 5–37)
Albumin: 4.7 g/dL (ref 3.5–5.2)
Alkaline Phosphatase: 37 U/L — ABNORMAL LOW (ref 39–117)
Bilirubin, Direct: 0.2 mg/dL (ref 0.1–0.3)
Total Bilirubin: 0.9 mg/dL (ref 0.2–1.2)
Total Protein: 6.9 g/dL (ref 6.0–8.3)

## 2024-11-18 LAB — BASIC METABOLIC PANEL WITH GFR
BUN: 17 mg/dL (ref 6–23)
CO2: 31 meq/L (ref 19–32)
Calcium: 9.2 mg/dL (ref 8.4–10.5)
Chloride: 101 meq/L (ref 96–112)
Creatinine, Ser: 0.56 mg/dL (ref 0.40–1.20)
GFR: 103.66 mL/min
Glucose, Bld: 99 mg/dL (ref 70–99)
Potassium: 4.3 meq/L (ref 3.5–5.1)
Sodium: 138 meq/L (ref 135–145)

## 2024-11-18 LAB — VITAMIN D 25 HYDROXY (VIT D DEFICIENCY, FRACTURES): VITD: 41.99 ng/mL (ref 30.00–100.00)

## 2024-11-18 LAB — LIPID PANEL
Cholesterol: 223 mg/dL — ABNORMAL HIGH (ref 28–200)
HDL: 82.9 mg/dL
LDL Cholesterol: 127 mg/dL — ABNORMAL HIGH (ref 10–99)
NonHDL: 139.7
Total CHOL/HDL Ratio: 3
Triglycerides: 63 mg/dL (ref 10.0–149.0)
VLDL: 12.6 mg/dL (ref 0.0–40.0)

## 2024-11-18 LAB — TSH: TSH: 1.18 u[IU]/mL (ref 0.35–5.50)

## 2024-11-18 NOTE — Progress Notes (Signed)
" ° °  Subjective:    Patient ID: Danielle Oneill, female    DOB: [birth year], 53 y.o.   MRN: [id number]  HPI CPE- UTD on mammo, pap, colonoscopy, Tdap.  Declines flu, PNA  Patient Care Team    Relationship Specialty Notifications Start End  Mahlon Comer BRAVO, MD PCP - General Family Medicine  04/02/11   Karin Delon CROME, NP  Nurse Practitioner  10/31/23      Health Maintenance  Topic Date Due   HIV Screening  Never done   COVID-19 Vaccine (5 - 2025-26 season) 12/04/2024 (Originally 07/20/2024)   Influenza Vaccine  02/16/2025 (Originally 06/19/2024)   Pneumococcal Vaccine: 50+ Years (1 of 2 - PCV) 11/18/2025 (Originally 09/21/1989)   Hepatitis B Vaccines 19-59 Average Risk (1 of 3 - 19+ 3-dose series) 11/18/2025 (Originally 09/21/1989)   Mammogram  10/29/2025   Cervical Cancer Screening (HPV/Pap Cotest)  07/10/2026   Colonoscopy  05/10/2028   DTaP/Tdap/Td (3 - Td or Tdap) 10/21/2031   Hepatitis C Screening  Completed   Zoster Vaccines- Shingrix  Completed   HPV VACCINES  Aged Out   Meningococcal B Vaccine  Aged Out     Review of Systems Patient reports no vision/ hearing changes, adenopathy,fever, weight change,  persistant/recurrent hoarseness , swallowing issues, chest pain, palpitations, edema, persistant/recurrent cough, hemoptysis, dyspnea (rest/exertional/paroxysmal nocturnal), gastrointestinal bleeding (melena, rectal bleeding), abdominal pain, significant heartburn, bowel changes, GU symptoms (dysuria, hematuria, incontinence), Gyn symptoms (abnormal  bleeding, pain),  syncope, focal weakness, memory loss, numbness & tingling, skin/hair/nail changes, abnormal bruising or bleeding, anxiety, or depression.     Objective:   Physical Exam General Appearance:    Alert, cooperative, no distress, appears stated age  Head:    Normocephalic, without obvious abnormality, atraumatic  Eyes:    PERRL, conjunctiva/corneas clear, EOM's intact both eyes  Ears:    Normal TM's and external ear  canals, both ears  Nose:   Nares normal, septum midline, mucosa normal, no drainage    or sinus tenderness  Throat:   Lips, mucosa, and tongue normal; teeth and gums normal  Neck:   Supple, symmetrical, trachea midline, no adenopathy;    Thyroid : no enlargement/tenderness/nodules  Back:     Symmetric, no curvature, ROM normal, no CVA tenderness  Lungs:     Clear to auscultation bilaterally, respirations unlabored  Chest Wall:    No tenderness or deformity   Heart:    Regular rate and rhythm, S1 and S2 normal, no murmur, rub   or gallop  Breast Exam:    Deferred to mammo  Abdomen:     Soft, non-tender, bowel sounds active all four quadrants,    no masses, no organomegaly  Genitalia:    Deferred  Rectal:    Extremities:   Extremities normal, atraumatic, no cyanosis or edema  Pulses:   2+ and symmetric all extremities  Skin:   Skin color, texture, turgor normal, no rashes or lesions  Lymph nodes:   Cervical, supraclavicular, and axillary nodes normal  Neurologic:   CNII-XII intact, normal strength, sensation and reflexes    throughout          Assessment & Plan:    "

## 2024-11-18 NOTE — Assessment & Plan Note (Signed)
 Pt's PE WNL w/ exception of BMI.  UTD on pap, mammo, colonoscopy, Tdap.  Flu shot given today.  Check labs.  Anticipatory guidance provided.

## 2024-11-18 NOTE — Telephone Encounter (Signed)
 FYI Only or Action Required?: Action required by provider: lab or test result follow-up needed.  Patient was last seen in primary care on 11/18/2024 by Mahlon Comer BRAVO, MD.  Called Nurse Triage reporting Results.  No symptoms at present.  Triage Disposition: Call PCP Within 24 Hours  Patient/caregiver understands and will follow disposition?: Yes     Copied from CRM #[id number]. Topic: Clinical - Lab/Test Results >> Nov 18, 2024  3:37 PM Suzen RAMAN wrote: Reason for CRM: Patient has additional question about her lab results Reason for Disposition  Caller requesting lab results  (Exception: Routine or non-urgent lab result.)  Answer Assessment - Initial Assessment Questions This RN advised that PCP has not yet reviewed the lab results and that, once reviewed, we can let her know what the interpretation of the results is. Advised pt that typically any critical values are expedited to the provider for them to contact pt so if pt does not receive a call about concerns with lab work then likely because okay to wait. Educated that HDL is happy lipids and LDL is lousy lipids, educated that alkaline phosphatase can tell info about liver function. Educated that lab work in general can be affected by circumstance, fasting versus not, and more, so it is important to get interpretation from doctor. Pt requesting call or MyChart message with feedback about lab results when available.    Pt has questions about lab results from 12/31 I feel fine don't have a problem, no symptoms Don't know what these results mean, can't pronounce half of them What do I need to eat to make these better What do these all mean Don't know the process  Protocols used: PCP Call - No Triage-A-AH

## 2024-11-18 NOTE — Patient Instructions (Signed)
Follow up in 1 year or as needed °We'll notify you of your lab results and make any changes if needed °Continue to work on healthy diet and regular exercise- you look great!! °Call with any questions or concerns °Stay Safe!  Stay Healthy! °Happy New Year!!! °

## 2024-11-18 NOTE — Addendum Note (Signed)
 Addended by: BEVELY CURTISTINE PARAS on: 11/18/2024 08:36 AM   Modules accepted: Orders

## 2024-11-20 NOTE — Telephone Encounter (Signed)
 Provider is out today and results have not been reviewed. Will call patient when results have been reviewed

## 2024-11-23 ENCOUNTER — Ambulatory Visit: Payer: Self-pay | Admitting: Family Medicine
